# Patient Record
Sex: Female | Born: 1955
Health system: Southern US, Community
[De-identification: ages and names within clinical notes are randomized; demographics above are authoritative.]

## PROBLEM LIST (undated history)

## (undated) DIAGNOSIS — S82201A Unspecified fracture of shaft of right tibia, initial encounter for closed fracture: Secondary | ICD-10-CM

## (undated) DIAGNOSIS — R112 Nausea with vomiting, unspecified: Secondary | ICD-10-CM

## (undated) DIAGNOSIS — E785 Hyperlipidemia, unspecified: Secondary | ICD-10-CM

## (undated) DIAGNOSIS — E039 Hypothyroidism, unspecified: Secondary | ICD-10-CM

## (undated) DIAGNOSIS — I10 Essential (primary) hypertension: Secondary | ICD-10-CM

## (undated) DIAGNOSIS — T8859XA Other complications of anesthesia, initial encounter: Secondary | ICD-10-CM

## (undated) DIAGNOSIS — E079 Disorder of thyroid, unspecified: Secondary | ICD-10-CM

## (undated) DIAGNOSIS — Z9889 Other specified postprocedural states: Secondary | ICD-10-CM

## (undated) DIAGNOSIS — U071 COVID-19: Secondary | ICD-10-CM

## (undated) DIAGNOSIS — T4145XA Adverse effect of unspecified anesthetic, initial encounter: Secondary | ICD-10-CM

## (undated) DIAGNOSIS — T300 Burn of unspecified body region, unspecified degree: Secondary | ICD-10-CM

## (undated) DIAGNOSIS — K449 Diaphragmatic hernia without obstruction or gangrene: Secondary | ICD-10-CM

## (undated) DIAGNOSIS — N6019 Diffuse cystic mastopathy of unspecified breast: Secondary | ICD-10-CM

## (undated) DIAGNOSIS — D649 Anemia, unspecified: Secondary | ICD-10-CM

## (undated) HISTORY — DX: Diaphragmatic hernia without obstruction or gangrene: K44.9

## (undated) HISTORY — DX: Unspecified fracture of shaft of right tibia, initial encounter for closed fracture: S82.201A

## (undated) HISTORY — DX: Disorder of thyroid, unspecified: E07.9

## (undated) HISTORY — PX: ABDOMINAL HYSTERECTOMY: SHX81

## (undated) HISTORY — PX: BLADDER SURGERY: SHX569

## (undated) HISTORY — DX: Burn of unspecified body region, unspecified degree: T30.0

## (undated) HISTORY — PX: HEMORROIDECTOMY: SUR656

## (undated) HISTORY — PX: TOE SURGERY: SHX1073

## (undated) HISTORY — DX: Diffuse cystic mastopathy of unspecified breast: N60.19

## (undated) HISTORY — DX: COVID-19: U07.1

## (undated) HISTORY — DX: Hyperlipidemia, unspecified: E78.5

## (undated) HISTORY — PX: FRACTURE SURGERY: SHX138

## (undated) HISTORY — PX: BREAST CYST ASPIRATION: SHX578

## (undated) HISTORY — DX: Essential (primary) hypertension: I10

---

## 1997-08-04 HISTORY — PX: OTHER SURGICAL HISTORY: SHX169

## 2004-07-21 ENCOUNTER — Emergency Department: Payer: Self-pay | Admitting: Emergency Medicine

## 2004-08-02 ENCOUNTER — Ambulatory Visit: Payer: Self-pay | Admitting: Podiatry

## 2004-11-29 ENCOUNTER — Emergency Department: Payer: Self-pay | Admitting: Emergency Medicine

## 2004-12-08 ENCOUNTER — Emergency Department: Payer: Self-pay | Admitting: Emergency Medicine

## 2004-12-09 ENCOUNTER — Ambulatory Visit: Payer: Self-pay | Admitting: Emergency Medicine

## 2005-01-24 ENCOUNTER — Ambulatory Visit: Payer: Self-pay | Admitting: Unknown Physician Specialty

## 2005-07-26 ENCOUNTER — Emergency Department: Payer: Self-pay | Admitting: Emergency Medicine
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
## 2006-03-05 ENCOUNTER — Ambulatory Visit: Payer: Self-pay | Admitting: General Surgery

## 2007-03-02 ENCOUNTER — Ambulatory Visit: Payer: Self-pay | Admitting: General Surgery

## 2008-02-08 ENCOUNTER — Ambulatory Visit: Payer: Self-pay | Admitting: General Surgery

## 2008-05-20 ENCOUNTER — Emergency Department: Payer: Self-pay | Admitting: Emergency Medicine

## 2009-02-08 ENCOUNTER — Ambulatory Visit: Payer: Self-pay | Admitting: General Surgery

## 2009-08-04 DIAGNOSIS — E079 Disorder of thyroid, unspecified: Secondary | ICD-10-CM

## 2009-08-04 HISTORY — PX: COLONOSCOPY: SHX174

## 2009-08-04 HISTORY — DX: Disorder of thyroid, unspecified: E07.9

## 2009-10-14 ENCOUNTER — Ambulatory Visit: Payer: Self-pay | Admitting: Internal Medicine

## 2010-01-28 LAB — HM PAP SMEAR

## 2010-02-11 ENCOUNTER — Ambulatory Visit: Payer: Self-pay | Admitting: Unknown Physician Specialty

## 2010-03-13 LAB — HM COLONOSCOPY

## 2010-04-01 HISTORY — PX: BREAST SURGERY: SHX581

## 2011-02-13 ENCOUNTER — Ambulatory Visit: Payer: Self-pay | Admitting: General Surgery
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
## 2012-02-17 ENCOUNTER — Ambulatory Visit: Payer: Self-pay | Admitting: General Surgery

## 2013-02-17 ENCOUNTER — Ambulatory Visit: Payer: Self-pay | Admitting: General Surgery

## 2013-02-18 ENCOUNTER — Encounter: Payer: Self-pay | Admitting: General Surgery

## 2013-03-01 ENCOUNTER — Ambulatory Visit (INDEPENDENT_AMBULATORY_CARE_PROVIDER_SITE_OTHER): Payer: BC Managed Care – PPO | Admitting: General Surgery

## 2013-03-01 ENCOUNTER — Encounter: Payer: Self-pay | Admitting: General Surgery

## 2013-03-01 VITALS — BP 130/70 | HR 74 | Resp 12 | Ht 63.0 in | Wt 175.0 lb

## 2013-03-01 DIAGNOSIS — Z87898 Personal history of other specified conditions: Secondary | ICD-10-CM | POA: Insufficient documentation

## 2013-03-01 DIAGNOSIS — N6019 Diffuse cystic mastopathy of unspecified breast: Secondary | ICD-10-CM

## 2013-03-01 HISTORY — DX: Personal history of other specified conditions: Z87.898

## 2013-03-01 NOTE — Progress Notes (Signed)
Patient ID: Lindsay Carney, female   DOB: 24-Jun-1956, 57 y.o.   MRN: [id number]  No chief complaint on file.   HPI Lindsay Carney is a 57 y.o. female who presents for a breast evaluation. The most recent mammogram was done at [facility] on 02/17/13 .  Patient does perform regular self breast checks and gets regular mammograms done. No breast symptoms to report. The patient does have dense breasts, and has a history of breast cyst aspirations.  HPI  Past Medical History  Diagnosis Date  . Diffuse cystic mastopathy   . Thyroid disease 2011  . Hyperlipidemia     Past Surgical History  Procedure Laterality Date  . Knee surgery Right   . Bladder surgery    . Toe surgery    . Colonoscopy  2011  . Breast surgery Right 04/01/10  . Abdominal hysterectomy      bladder attached  . Lipoma removal  1999    inner knee    Family History  Problem Relation Age of Onset  . Breast cancer Paternal Aunt     Social History History  Substance Use Topics  . Smoking status: Never Smoker   . Smokeless tobacco: Never Used  . Alcohol Use: No    No Known Allergies  Current Outpatient Prescriptions  Medication Sig Dispense Refill  . aspirin 81 MG tablet Take 81 mg by mouth daily.      Marland Kitchen atorvastatin (LIPITOR) 20 MG tablet Take 20 mg by mouth daily.      . cholecalciferol (VITAMIN D) 1000 UNITS tablet Take 1,000 Units by mouth daily.      Marland Kitchen docusate sodium (COLACE) 250 MG capsule Take 250 mg by mouth 2 (two) times daily.      Marland Kitchen levothyroxine (SYNTHROID, LEVOTHROID) 75 MCG tablet Take 75 mcg by mouth daily before breakfast.      . Multiple Vitamins-Minerals (ONE-A-DAY 50 PLUS) TABS Take 1 tablet by mouth daily.      . Omega-3 Fatty Acids (FISH OIL) 1200 MG CAPS Take 1 capsule by mouth daily.       No current facility-administered medications for this visit.    Review of Systems Review of Systems  Constitutional: Negative.   Respiratory: Negative.   Cardiovascular: Negative.     Blood  pressure 130/70, pulse 74, resp. rate 12, height 5\' 3"  (1.6 m), weight 175 lb (79.379 kg).  Physical Exam Physical Exam  Constitutional: She is oriented to person, place, and time. She appears well-developed and well-nourished.  Neck: Neck supple.  Cardiovascular: Normal rate and regular rhythm.   Pulmonary/Chest: Effort normal and breath sounds normal. Right breast exhibits no inverted nipple, no mass, no nipple discharge, no skin change and no tenderness. Left breast exhibits no inverted nipple, no mass, no nipple discharge, no skin change and no tenderness.  Lymphadenopathy:    She has no cervical adenopathy.  Neurological: She is alert and oriented to person, place, and time.  Skin:       Data Reviewed Mammogram stable  Assessment    Stable exam     Plan    Return in 1 year with Screening mammogram        Hipolito Martinezlopez G 03/02/2013, 6:32 AM

## 2013-03-01 NOTE — Patient Instructions (Addendum)
Call if any changes.  

## 2013-03-02 ENCOUNTER — Encounter: Payer: Self-pay | Admitting: General Surgery

## 2014-03-02 ENCOUNTER — Encounter: Payer: Self-pay | Admitting: General Surgery

## 2014-03-09 ENCOUNTER — Ambulatory Visit (INDEPENDENT_AMBULATORY_CARE_PROVIDER_SITE_OTHER): Payer: BC Managed Care – PPO | Admitting: General Surgery

## 2014-03-09 ENCOUNTER — Encounter: Payer: Self-pay | Admitting: General Surgery

## 2014-03-09 VITALS — BP 128/64 | HR 72 | Resp 12 | Ht 63.0 in | Wt 178.0 lb

## 2014-03-09 DIAGNOSIS — N6019 Diffuse cystic mastopathy of unspecified breast: Secondary | ICD-10-CM

## 2014-03-09 DIAGNOSIS — Z803 Family history of malignant neoplasm of breast: Secondary | ICD-10-CM

## 2014-03-09 NOTE — Patient Instructions (Signed)
Follow up in one year with bilateral screening mammogram and office visit. Continue self breast exams. Call office for any new breast issues or concerns.

## 2014-03-09 NOTE — Progress Notes (Signed)
Patient ID: Lindsay Carney, female   DOB: 1955-12-05, 58 y.o.   MRN: [id number]  Chief Complaint  Patient presents with  . Follow-up    mammogram    HPI Lindsay Carney is a 58 y.o. female.  who presents for her follow up breast evaluation. The most recent mammogram was done on 02-27-14.  Patient does perform regular self breast checks and gets regular mammograms done. No new breast issues.   HPI  Past Medical History  Diagnosis Date  . Diffuse cystic mastopathy   . Thyroid disease 2011  . Hyperlipidemia     Past Surgical History  Procedure Laterality Date  . Knee surgery Right   . Bladder surgery    . Toe surgery    . Colonoscopy  2011  . Breast surgery Right 04/01/10  . Abdominal hysterectomy      bladder attached  . Lipoma removal  1999    inner knee    Family History  Problem Relation Age of Onset  . Breast cancer Paternal Aunt     Social History History  Substance Use Topics  . Smoking status: Never Smoker   . Smokeless tobacco: Never Used  . Alcohol Use: No    No Known Allergies  Current Outpatient Prescriptions  Medication Sig Dispense Refill  . aspirin 81 MG tablet Take 81 mg by mouth daily.      Marland Kitchen atorvastatin (LIPITOR) 20 MG tablet Take 20 mg by mouth daily.      . cholecalciferol (VITAMIN D) 1000 UNITS tablet Take 1,000 Units by mouth daily.      Marland Kitchen docusate sodium (COLACE) 250 MG capsule Take 250 mg by mouth 2 (two) times daily.      Marland Kitchen levothyroxine (SYNTHROID, LEVOTHROID) 75 MCG tablet Take 75 mcg by mouth daily before breakfast.      . Multiple Vitamins-Minerals (ONE-A-DAY 50 PLUS) TABS Take 1 tablet by mouth daily.      . Omega-3 Fatty Acids (FISH OIL) 1200 MG CAPS Take 1 capsule by mouth daily.       No current facility-administered medications for this visit.    Review of Systems Review of Systems  Constitutional: Negative.   Respiratory: Negative.   Cardiovascular: Negative.     Blood pressure 128/64, pulse 72, resp. rate 12, height 5'  3" (1.6 m), weight 178 lb (80.74 kg).  Physical Exam Physical Exam  Constitutional: She is oriented to person, place, and time. She appears well-developed and well-nourished.  Eyes: Conjunctivae are normal. No scleral icterus.  Neck: Neck supple.  Cardiovascular: Normal rate, regular rhythm and normal heart sounds.   Pulmonary/Chest: Effort normal and breath sounds normal. Right breast exhibits no inverted nipple, no mass, no nipple discharge, no skin change and no tenderness. Left breast exhibits no inverted nipple, no mass, no nipple discharge, no skin change and no tenderness.  Lymphadenopathy:    She has no cervical adenopathy.    She has no axillary adenopathy.  Neurological: She is alert and oriented to person, place, and time.  Skin: Skin is warm and dry.     Lipomas are unchanged    Data Reviewed Mammogram reviewed and stable.  Assessment    Stable physical exam.    Plan    Follow up in one year with bilateral screening mammogram and office visit.       SANKAR,SEEPLAPUTHUR G 03/10/2014, 9:27 AM

## 2014-03-10 ENCOUNTER — Encounter: Payer: Self-pay | Admitting: General Surgery

## 2014-06-05 ENCOUNTER — Encounter: Payer: Self-pay | Admitting: General Surgery

## 2014-07-14 ENCOUNTER — Ambulatory Visit: Payer: Self-pay | Admitting: Family Medicine

## 2014-07-24 ENCOUNTER — Ambulatory Visit (INDEPENDENT_AMBULATORY_CARE_PROVIDER_SITE_OTHER): Payer: BC Managed Care – PPO | Admitting: Family Medicine

## 2014-07-24 ENCOUNTER — Encounter: Payer: Self-pay | Admitting: Family Medicine

## 2014-07-24 VITALS — BP 124/70 | HR 73 | Temp 98.0°F | Ht 62.75 in | Wt 186.5 lb

## 2014-07-24 DIAGNOSIS — Z8639 Personal history of other endocrine, nutritional and metabolic disease: Secondary | ICD-10-CM | POA: Insufficient documentation

## 2014-07-24 DIAGNOSIS — H6980 Other specified disorders of Eustachian tube, unspecified ear: Secondary | ICD-10-CM

## 2014-07-24 DIAGNOSIS — J329 Chronic sinusitis, unspecified: Secondary | ICD-10-CM

## 2014-07-24 DIAGNOSIS — E038 Other specified hypothyroidism: Secondary | ICD-10-CM

## 2014-07-24 DIAGNOSIS — R232 Flushing: Secondary | ICD-10-CM | POA: Insufficient documentation

## 2014-07-24 DIAGNOSIS — R0683 Snoring: Secondary | ICD-10-CM

## 2014-07-24 DIAGNOSIS — E039 Hypothyroidism, unspecified: Secondary | ICD-10-CM | POA: Insufficient documentation

## 2014-07-24 DIAGNOSIS — E669 Obesity, unspecified: Secondary | ICD-10-CM | POA: Insufficient documentation

## 2014-07-24 DIAGNOSIS — K5900 Constipation, unspecified: Secondary | ICD-10-CM

## 2014-07-24 DIAGNOSIS — N951 Menopausal and female climacteric states: Secondary | ICD-10-CM

## 2014-07-24 DIAGNOSIS — E785 Hyperlipidemia, unspecified: Secondary | ICD-10-CM

## 2014-07-24 HISTORY — DX: Personal history of other endocrine, nutritional and metabolic disease: Z86.39

## 2014-07-24 HISTORY — DX: Flushing: R23.2

## 2014-07-24 HISTORY — DX: Obesity, unspecified: E66.9

## 2014-07-24 HISTORY — DX: Constipation, unspecified: K59.00

## 2014-07-24 LAB — COMPREHENSIVE METABOLIC PANEL
ALK PHOS: 85 U/L (ref 39–117)
ALT: 26 U/L (ref 0–35)
AST: 21 U/L (ref 0–37)
Albumin: 4.3 g/dL (ref 3.5–5.2)
BILIRUBIN TOTAL: 0.8 mg/dL (ref 0.2–1.2)
BUN: 12 mg/dL (ref 6–23)
CO2: 28 mEq/L (ref 19–32)
Calcium: 9.3 mg/dL (ref 8.4–10.5)
Chloride: 103 mEq/L (ref 96–112)
Creatinine, Ser: 0.6 mg/dL (ref 0.4–1.2)
GFR: 109.75 mL/min (ref >60.00)
Glucose, Bld: 108 mg/dL — ABNORMAL HIGH (ref 70–99)
POTASSIUM: 4.1 meq/L (ref 3.5–5.1)
SODIUM: 139 meq/L (ref 135–145)
TOTAL PROTEIN: 7.1 g/dL (ref 6.0–8.3)

## 2014-07-24 LAB — CBC WITH DIFFERENTIAL/PLATELET
Basophils Absolute: 0 10*3/uL (ref 0.0–0.1)
Basophils Relative: 0.6 % (ref 0.0–3.0)
EOS ABS: 0.1 10*3/uL (ref 0.0–0.7)
Eosinophils Relative: 1.2 % (ref 0.0–5.0)
HCT: 41.8 % (ref 36.0–46.0)
Hemoglobin: 13.8 g/dL (ref 12.0–15.0)
Lymphocytes Relative: 28.2 % (ref 12.0–46.0)
Lymphs Abs: 1.5 10*3/uL (ref 0.7–4.0)
MCHC: 33 g/dL (ref 30.0–36.0)
MCV: 85 fl (ref 78.0–100.0)
MONO ABS: 0.3 10*3/uL (ref 0.1–1.0)
Monocytes Relative: 6.6 % (ref 3.0–12.0)
NEUTROS PCT: 63.4 % (ref 43.0–77.0)
Neutro Abs: 3.3 10*3/uL (ref 1.4–7.7)
PLATELETS: 271 10*3/uL (ref 150.0–400.0)
RBC: 4.92 Mil/uL (ref 3.87–5.11)
RDW: 14.2 % (ref 11.5–15.5)
WBC: 5.2 10*3/uL (ref 4.0–10.5)

## 2014-07-24 LAB — LIPID PANEL
CHOLESTEROL: 189 mg/dL (ref 0–200)
HDL: 41.6 mg/dL (ref >39.00)
LDL CALC: 122 mg/dL — AB (ref 0–99)
NonHDL: 147.4
TRIGLYCERIDES: 125 mg/dL (ref 0.0–149.0)
Total CHOL/HDL Ratio: 5
VLDL: 25 mg/dL (ref 0.0–40.0)

## 2014-07-24 NOTE — Assessment & Plan Note (Signed)
  Discussed strategies for weight loss including increased physical activity and tracking her calories.Also referred to nutritionist to help her with meal planning. The patient indicates understanding of these issues and agrees with the plan.\

## 2014-07-24 NOTE — Progress Notes (Signed)
Patient ID: Lindsay Carney, female   DOB: July 11, 1956, 58 y.o.   MRN: [id number]   Subjective:   Patient ID: Lindsay Carney, female    DOB: [birth year], 58 y.o.   MRN: [id number]  DORETHEA Carney is a pleasant 58 y.o. year old female who presents to clinic today with [place] [facility] and Nasal Congestion  on 07/24/2014  HPI: Hypothyroidism- has been taking synthroid 75 mg daily at this dose for at least a year. Denies any symptoms of hypo thyroidism other than difficulty losing weight.  Denies any hot or cold intolerance.  No changes in her skin or hair. No difficulty swallowing or hoarseness.  HLD- taking liptor 10 mg daily- needs trade name rx.  Per pt, generic "is not effective." Denies myalgias.  Obesity- "I am larger than I have ever been!"  Wants to work on losing weight.  Feels she eats healthy.  She does not exercise much.  ?sinus problems- past several months, on and off, feeling like her sinuses are clogged.  Even when she sneezes, she feels that she is not getting relief.  Ears pop all the time.  Using saline nasal sprays.  No nasal steroids or oral antihistamines.  Eyes are often watery and itchy.  Does not have a h/o allergies. No new pets- has had the same cat for 12 years.  Unaware of any other changes in her environment at work or at home.  Her husband also says that she has started snoring recently.  ? Menopausal- had a hysterectomy years ago due to endometriosis and pelvic organ prolapse.  Does still have her ovaries.  More hot flashes lately.  No issues with insomnia or vaginal dryness.  Last year, labs showed she was "almost menopausal."  Wants labs repeated today.  "pitutiary diease"- years ago was taking rx for her pituitary- has not had it for years.  She does not have HA or blurred vision and did not have those symptoms at that time.  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  Current Outpatient Prescriptions on File Prior to Visit  Medication Sig Dispense Refill  .  aspirin 81 MG tablet Take 81 mg by mouth daily.    Marland Kitchen atorvastatin (LIPITOR) 20 MG tablet Take 20 mg by mouth daily.    . cholecalciferol (VITAMIN D) 1000 UNITS tablet Take 1,000 Units by mouth daily.    Marland Kitchen docusate sodium (COLACE) 250 MG capsule Take 250 mg by mouth 2 (two) times daily.    Marland Kitchen levothyroxine (SYNTHROID, LEVOTHROID) 75 MCG tablet Take 75 mcg by mouth daily before breakfast.    . Multiple Vitamins-Minerals (ONE-A-DAY 50 PLUS) TABS Take 1 tablet by mouth daily.    . Omega-3 Fatty Acids (FISH OIL) 1200 MG CAPS Take 1 capsule by mouth daily.     No current facility-administered medications on file prior to visit.    No Known Allergies  Past Medical History  Diagnosis Date  . Diffuse cystic mastopathy   . Thyroid disease 2011  . Hyperlipidemia     Past Surgical History  Procedure Laterality Date  . Knee surgery Right   . Bladder surgery    . Toe surgery    . Colonoscopy  2011  . Breast surgery Right 04/01/10  . Abdominal hysterectomy      bladder attached  . Lipoma removal  1999    inner knee  . Hemorroidectomy      Family History  Problem Relation Age of Onset  . Breast cancer Paternal  Aunt   . Cancer Paternal Aunt   . Arthritis Mother   . Hypertension Mother   . Cancer Father   . Hyperlipidemia Father   . Diabetes Father     History   Social History  . Marital Status: Married    Spouse Name: N/A    Number of Children: N/A  . Years of Education: N/A   Occupational History  . Not on file.   Social History Main Topics  . Smoking status: Never Smoker   . Smokeless tobacco: Never Used  . Alcohol Use: No  . Drug Use: No  . Sexual Activity: Not on file   Other Topics Concern  . Not on file   Social History Narrative   The PMH, PSH, Social History, Family History, Medications, and allergies have been reviewed in [facility], and have been updated if relevant.  Review of Systems  Constitutional: Negative for chills, activity change, appetite change,  fatigue and unexpected weight change.  HENT: Positive for congestion, postnasal drip, sinus pressure and sneezing. Negative for ear discharge, ear pain, facial swelling, hearing loss, mouth sores, nosebleeds, rhinorrhea, sore throat, tinnitus, trouble swallowing and voice change.   Eyes: Positive for redness and itching. Negative for photophobia, pain, discharge and visual disturbance.  Respiratory: Negative.   Cardiovascular: Negative.   Gastrointestinal: Negative.   Endocrine: Negative.   Genitourinary: Negative.   Musculoskeletal: Negative.   Skin: Negative.   Allergic/Immunologic: Negative for food allergies and immunocompromised state.  Neurological: Negative.   Hematological: Negative.   Psychiatric/Behavioral: Negative.   All other systems reviewed and are negative.      Objective:    BP 124/70 mmHg  Pulse 73  Temp(Src) 98 F (36.7 C) (Oral)  Ht 5' 2.75" (1.594 m)  Wt 186 lb 8 oz (84.596 kg)  BMI 33.29 kg/m2  SpO2 97%   Physical Exam  Constitutional: She is oriented to person, place, and time. She appears well-developed and well-nourished. No distress.  HENT:  Head: Normocephalic and atraumatic.  Right Ear: No lacerations. No drainage, swelling or tenderness. No foreign bodies. No mastoid tenderness. Tympanic membrane is not injected, not scarred, not perforated, not erythematous, not retracted and not bulging. Tympanic membrane mobility is normal. A middle ear effusion is present. No hemotympanum. No decreased hearing is noted.  Left Ear: No lacerations. No drainage, swelling or tenderness. No foreign bodies. No mastoid tenderness. Tympanic membrane is not injected, not scarred, not perforated, not erythematous, not retracted and not bulging. Tympanic membrane mobility is normal. A middle ear effusion is present. No hemotympanum. No decreased hearing is noted.  Nose: Mucosal edema present. No rhinorrhea or sinus tenderness. Right sinus exhibits no maxillary sinus  tenderness and no frontal sinus tenderness. Left sinus exhibits no maxillary sinus tenderness and no frontal sinus tenderness.  Mouth/Throat: Uvula is midline and mucous membranes are normal. Posterior oropharyngeal edema present. No oropharyngeal exudate, posterior oropharyngeal erythema or tonsillar abscesses.  Eyes: EOM are normal. Right conjunctiva is not injected. Left conjunctiva is injected.  Cardiovascular: Normal rate and normal heart sounds.   Pulmonary/Chest: Effort normal and breath sounds normal.  Abdominal: Soft. Bowel sounds are normal.  Musculoskeletal: Normal range of motion.  Neurological: She is alert and oriented to person, place, and time.  Skin: Skin is warm and dry.  Psychiatric: She has a normal mood and affect. Her behavior is normal. Judgment and thought content normal.          Assessment & Plan:   Other specified hypothyroidism -  Plan: CBC with Differential, TSH, T4, Free  HLD (hyperlipidemia) - Plan: Comprehensive metabolic panel, Lipid panel  Constipation, unspecified constipation type  Recurrent sinusitis No Follow-up on file.

## 2014-07-24 NOTE — Assessment & Plan Note (Signed)
Awaiting records- unclear what her diagnosis truly was. Will check prolactin level. No red flag symptoms warranting brain imaging at this time.

## 2014-07-24 NOTE — Assessment & Plan Note (Signed)
Continue current rx for now. Check labs today.

## 2014-07-24 NOTE — Assessment & Plan Note (Signed)
Continue current dose of lipitor. Recheck labs today.

## 2014-07-24 NOTE — Patient Instructions (Addendum)
Nice to meet you.  I would like for you to start Allegra or Claritin daily.   Also try an antihistamine/decongestant like Allegra or Allegra D.  Try over the counter nasocort-start with 2 sprays per nostril per day...and then try to taper to 1 spray per nostril once symptoms improve.   We are referring to an ENT and a nutritionist- we will call you with an appointment or you can stop by to see Rosaria Ferries on your way out.  We will call you with your lab results or you can view them online.  Try to increase your physical activity.

## 2014-07-24 NOTE — Assessment & Plan Note (Signed)
New- likely more than one issues- increased BMI along with ETD that is not being managed. With weight loss and ETD management, this should improve.  If not, refer to pulm for sleep study. The patient indicates understanding of these issues and agrees with the plan.

## 2014-07-24 NOTE — Assessment & Plan Note (Signed)
Check LH and FSH today per pt request.

## 2014-07-24 NOTE — Progress Notes (Signed)
Pre visit review using our clinic review tool, if applicable. No additional management support is needed unless otherwise documented below in the visit note. 

## 2014-07-24 NOTE — Assessment & Plan Note (Signed)
New- progressive. Start inhaled steroid- nasocort and advised oral antihistamine like Allegra or Claritin daily. Given her level of concern and duration/progression of symptoms, will also refer to ENT. The patient indicates understanding of these issues and agrees with the plan.

## 2014-07-25 ENCOUNTER — Telehealth: Payer: Self-pay | Admitting: Family Medicine

## 2014-07-25 LAB — PROLACTIN: PROLACTIN: 4.3 ng/mL

## 2014-07-25 LAB — T4, FREE: Free T4: 0.8 ng/dL (ref 0.60–1.60)

## 2014-07-25 LAB — TSH: TSH: 1.68 u[IU]/mL (ref 0.35–4.50)

## 2014-07-25 LAB — LUTEINIZING HORMONE: LH: 21.84 m[IU]/mL

## 2014-07-25 LAB — FOLLICLE STIMULATING HORMONE: FSH: 40 m[IU]/mL

## 2014-07-25 NOTE — Telephone Encounter (Signed)
Pt called [facility]

## 2014-07-26 ENCOUNTER — Encounter: Payer: Self-pay | Admitting: *Deleted

## 2014-07-31 ENCOUNTER — Ambulatory Visit: Payer: Self-pay | Admitting: Family Medicine

## 2014-08-01 ENCOUNTER — Telehealth: Payer: Self-pay | Admitting: Family Medicine

## 2014-08-01 NOTE — Telephone Encounter (Signed)
Pt called and is concerned b/c there are dates missing on MyChart for her last pap, tetanus, and colonoscopy.  They were done by other providers and she would like the dates added to her chart.  Her last pap was 01/28/2010 by Dr. Elisabeth Pigeon, but should have been transferred to [facility]; her last tetanus was a Tdap on 03/02/2014 by [facility].; colonoscopy was 03/13/2010 by Dr. Alain [facility], everything was clear, no polyps or problems, told to come back in 10 years.  If you have any questions please call pt. Thank you.

## 2014-08-01 NOTE — Telephone Encounter (Signed)
Lindsay Carney,  Can you please abstract this in her chart? RB

## 2014-08-03 ENCOUNTER — Ambulatory Visit: Payer: Self-pay | Admitting: Family Medicine

## 2014-08-04 ENCOUNTER — Ambulatory Visit: Payer: Self-pay | Admitting: Family Medicine

## 2014-08-10 ENCOUNTER — Encounter: Payer: Self-pay | Admitting: Family Medicine

## 2014-08-10 NOTE — Telephone Encounter (Signed)
Pt chart up dated.

## 2014-08-15 ENCOUNTER — Other Ambulatory Visit: Payer: Self-pay

## 2014-08-15 MED ORDER — LEVOTHYROXINE SODIUM 75 MCG PO TABS: 75.0000 ug | ORAL_TABLET | Freq: Every day | ORAL | Status: DC

## 2014-08-15 NOTE — Telephone Encounter (Signed)
Pt request 90 day refill levothyroxine to [facility]. Advised pt done.

## 2014-09-04 ENCOUNTER — Ambulatory Visit: Payer: Self-pay | Admitting: Family Medicine

## 2014-10-03 ENCOUNTER — Ambulatory Visit
Admit: 2014-10-03 | Disposition: A | Discharge status: Home / Self Care | Payer: Self-pay | Attending: Family Medicine | Admitting: Family Medicine

## 2014-10-06 ENCOUNTER — Telehealth: Payer: Self-pay

## 2014-10-06 DIAGNOSIS — E785 Hyperlipidemia, unspecified: Secondary | ICD-10-CM

## 2014-10-06 NOTE — Telephone Encounter (Signed)
Pt left v/m;has changed ins to [facility] and ins wants pt to use generic meds; pt said she has tried taking atorvastatin and atorvastatin was not effective to lower cholesterol. Pt request [facility] to contact ins and get Lipitor approved.

## 2014-10-06 NOTE — Telephone Encounter (Signed)
Spoke to pt who states that she recently picked up Rx. Advised pt that when she is down to 1wk left, contact office back so Rx be sent in as DAW. Pharmacy should send as requested, but if not request pt will have form faxed for completion and approval. Insurance company advised pt to send documentation showing she has failed on generic and required name brand. Pt states that she had requested records be sent from previous provider, but they have not been scanned in pt chart. Advised her they may have come, been signed off on by Dr Deborra Medina, and are awaiting scanning. Advised pt that even if send in Rx now, a determination will not be decided on DAW Rx, because they have already provided med coverage for this month. Pt will contact office back one week before completion of medication to have new Rx sent; provides enough time in the event that a PA is required so pt will not run out of meds.

## 2014-11-03 ENCOUNTER — Ambulatory Visit
Admit: 2014-11-03 | Disposition: A | Discharge status: Home / Self Care | Payer: Self-pay | Attending: Family Medicine | Admitting: Family Medicine

## 2014-11-06 NOTE — Telephone Encounter (Signed)
Patient left a voicemail stating that she now needs a refill on her Lipitor and was told to call back about a week before she needed the refill so that the medication could be approved since she needs name brand. See previous note dated 10/06/14.

## 2014-11-07 MED ORDER — ATORVASTATIN CALCIUM 20 MG PO TABS: 20.0000 mg | ORAL_TABLET | Freq: Every day | ORAL | Status: DC

## 2014-11-07 NOTE — Addendum Note (Signed)
Addended by: Kyra Manges on: 11/07/2014 01:26 PM   Modules accepted: Orders

## 2014-11-07 NOTE — Telephone Encounter (Signed)
Last OV 07/24/14, Medication last ordered 03/01/2013

## 2014-11-07 NOTE — Addendum Note (Signed)
Addended by: Kyra Manges on: 11/07/2014 10:01 AM   Modules accepted: Orders

## 2014-11-07 NOTE — Telephone Encounter (Signed)
Ok to refill as requested 

## 2014-11-13 NOTE — Telephone Encounter (Signed)
Pt left v/m; pt has changed ins; lipitor refill was sent in last week and pt was told that ins was requiring more paperwork and pt request cb with status of name brand Lipitor; pt has taken generic before and it did not work; pt has been out of med since 11/09/14. Pt request cb.

## 2014-11-14 MED ORDER — ATORVASTATIN CALCIUM 20 MG PO TABS: 20.0000 mg | ORAL_TABLET | Freq: Every day | ORAL | Status: DC

## 2014-11-14 NOTE — Addendum Note (Signed)
Addended by: Modena Nunnery on: 11/14/2014 08:06 AM   Modules accepted: Orders

## 2014-11-14 NOTE — Telephone Encounter (Signed)
Lm on pts vm requesting a call back. Rx re-sent DAW

## 2014-11-17 ENCOUNTER — Telehealth: Payer: Self-pay | Admitting: Family Medicine

## 2014-11-17 DIAGNOSIS — E785 Hyperlipidemia, unspecified: Secondary | ICD-10-CM

## 2014-11-17 NOTE — Telephone Encounter (Signed)
Pt called checking on rx for liptor.  Insurance wants proof that she has tried other chol meds.  Pt also wanted to know if she needs to come and get lab work done for her chol.  The last day she has taken this med was 11/09/14  Please advise

## 2014-11-17 NOTE — Telephone Encounter (Signed)
Great work in losing [employer] and changing her lifestyle.  Orders entered for lipid panel and CMET.

## 2014-11-17 NOTE — Telephone Encounter (Signed)
Spoke to pt and informed her that fax received on today indicating Lipitor rejected due to pt never trying an alternate cholesterol medication. Advised pt to contact insurance company and determine which medication is covered and contact office back to have new Rx sent.  Pt states that she is wanting to have repeat labs as she has lost 12lbs, and thinks it may have helped to lower cholesterol, and she may not need to continue lipid meds. If pt is able to have repeat labs, pls enter order and I will contact pt to advise and schedule appt

## 2014-11-17 NOTE — Telephone Encounter (Signed)
Lm on pts vm and informed per Dr Deborra Medina. Pt advised to contact office and schedule lab appt

## 2014-12-01 ENCOUNTER — Other Ambulatory Visit (INDEPENDENT_AMBULATORY_CARE_PROVIDER_SITE_OTHER): Payer: 59

## 2014-12-01 DIAGNOSIS — E785 Hyperlipidemia, unspecified: Secondary | ICD-10-CM | POA: Diagnosis not present

## 2014-12-01 LAB — COMPREHENSIVE METABOLIC PANEL
ALBUMIN: 4.2 g/dL (ref 3.5–5.2)
ALT: 14 U/L (ref 0–35)
AST: 15 U/L (ref 0–37)
Alkaline Phosphatase: 71 U/L (ref 39–117)
BILIRUBIN TOTAL: 0.6 mg/dL (ref 0.2–1.2)
BUN: 11 mg/dL (ref 6–23)
CHLORIDE: 103 meq/L (ref 96–112)
CO2: 29 mEq/L (ref 19–32)
Calcium: 9.4 mg/dL (ref 8.4–10.5)
Creatinine, Ser: 0.56 mg/dL (ref 0.40–1.20)
GFR: 118.7 mL/min (ref >60.00)
GLUCOSE: 91 mg/dL (ref 70–99)
Potassium: 3.9 mEq/L (ref 3.5–5.1)
SODIUM: 138 meq/L (ref 135–145)
TOTAL PROTEIN: 6.8 g/dL (ref 6.0–8.3)

## 2014-12-01 LAB — LIPID PANEL
Cholesterol: 249 mg/dL — ABNORMAL HIGH (ref 0–200)
HDL: 42.5 mg/dL (ref >39.00)
LDL Cholesterol: 175 mg/dL — ABNORMAL HIGH (ref 0–99)
NonHDL: 206.5
Total CHOL/HDL Ratio: 6
Triglycerides: 159 mg/dL — ABNORMAL HIGH (ref 0.0–149.0)
VLDL: 31.8 mg/dL (ref 0.0–40.0)

## 2014-12-04 ENCOUNTER — Other Ambulatory Visit: Payer: Self-pay | Admitting: Family Medicine

## 2014-12-04 DIAGNOSIS — E785 Hyperlipidemia, unspecified: Secondary | ICD-10-CM

## 2014-12-06 ENCOUNTER — Telehealth: Payer: Self-pay

## 2014-12-06 NOTE — Telephone Encounter (Signed)
[employer] left v/m requesting cb at [phone] for prior auth for Lipitor.

## 2014-12-06 NOTE — Telephone Encounter (Signed)
Pt left v/m pt requesting prior auth because name brand lipitor is tier 3; pt took generic lipitor for one year and it was not effective for pt and pt wants name brand lipitor. Pt request cb.

## 2014-12-07 NOTE — Telephone Encounter (Addendum)
Pt left v/m; pt had contacted [facility] and [employer] will not accept fax # but does need a call to [phone]. Pt said [employer] needs cb that pt needs namebrand Lipitor only.

## 2014-12-07 NOTE — Telephone Encounter (Addendum)
Form received, completed and faxed back to requested party. Awaiting response. No info known regarding previous message below.

## 2014-12-07 NOTE — Telephone Encounter (Signed)
contacted [employer] but was unable wait 48min due to being in clinic. Spoke to pt and informed her that PA was previously completed and denied. Pt states she will contact insurance and have PA form faxed for completion

## 2014-12-15 ENCOUNTER — Encounter: Payer: 59 | Attending: Family Medicine | Admitting: Dietician

## 2014-12-15 ENCOUNTER — Encounter: Payer: Self-pay | Admitting: Dietician

## 2014-12-15 VITALS — Ht 62.75 in | Wt 171.8 lb

## 2014-12-15 DIAGNOSIS — R7309 Other abnormal glucose: Secondary | ICD-10-CM | POA: Diagnosis present

## 2014-12-15 DIAGNOSIS — E669 Obesity, unspecified: Secondary | ICD-10-CM | POA: Diagnosis not present

## 2014-12-15 DIAGNOSIS — E785 Hyperlipidemia, unspecified: Secondary | ICD-10-CM | POA: Diagnosis present

## 2014-12-15 DIAGNOSIS — R7303 Prediabetes: Secondary | ICD-10-CM

## 2014-12-15 NOTE — Progress Notes (Signed)
Medical Nutrition Therapy: Visit time: 8:15-8:45am  This is 4th follow-up appointment. Initial appt. Was 08/04/15 Assessment:  Diagnosis: obesity, hyperlipidemia, pre-diabetes  Current weight: 171.8  Height: 62.75 in Medications, supplements: see list Progress and evaluation: Pt. Has lost 4.3 lbs since previous visit, 6 weeks ago. She met goal of limiting dessert at part-time job dinner to 1/2 serving and eliminated yeast rolls. She has not been able to add 10 minutes to exercise time. She continues with previous positive diet changes.She eats 3 meals per day, spaced 4-5 hours apart and includes a small snack if time between meals is longer. She has increased her water intake and eliminated all sugar-sweetened beverages.She limits her carbohydrate servings to 2-3 per meal. Her most recent labwork indicates a FBG of 91, down from 108 in 12/15. Her total and LDL cholesterol increased and she explained that she has not taken her statin medication since mid-April due to insurance coverage issues. She is working presently to have resolved and knows the importance of taking her medication.   Physical activity: 30 minutes on glider, daily   Nutrition Care Education: Basic Nutrition: encouraged calcium sources and grains to increase fiber. Weight control: benefits of weight control, identifying healthy weight, determining reasonable weight goal, behavioral changes for weight loss Pre- Diabetes:  goals for BGs, appropriate meal and snack schedule, Carb counting, appropriate carb intake and balance, Hyperlipidemia:  target goals for lipids, healthy and unhealthy fats, role of fiber food sources of folate, Vitamin B12, B6, Vitamin E, phytochemicals Other lifestyle changes:  benefits of making changes, increasing motivation, readiness for change, identifying habits that need to change,   Nutritional Diagnosis:  Low calcium intake, low fiber intake   Intervention:  Pt. To continue with previous goals. To  increase intensity of exercise. Set goal to have BMI less than 30 at next visit.   Education Materials given:  Marland Kitchen Goals/ instructions  Learner/ who was taught:  . Patient   Level of understanding: . Unable to understand/ needs instruction . Partial understanding; needs review/ practice . Verbalizes/ demonstrates competency . Not applicable Demonstrated degree of understanding via:   Teach back Learning barriers: . None   Willingness to learn/ readiness for change: . Eager, change in progress .   Monitoring and Evaluation:  Follow-up- 01/26/15 at 8:00am.

## 2014-12-15 NOTE — Patient Instructions (Signed)
Pt. To continue with previous goals. To increase intensity of exercise. Set goal to have BMI less than 30 at next visit.

## 2014-12-19 NOTE — Telephone Encounter (Addendum)
[employer] called wanting to verify dx code for lipitor, I gave them hyperlipidemia E78.5.   Just FYI

## 2014-12-19 NOTE — Telephone Encounter (Signed)
Fax received indicating pts medication covered through 12/19/2015. Copy faxed to pharmacy and sent for scanning

## 2014-12-19 NOTE — Telephone Encounter (Signed)
[employer] calling stating they did not receive fax that's scanned into pt's chart. I advised it was faxed on 12/07/14. They would like to speak directly her nurse.

## 2015-01-26 ENCOUNTER — Encounter: Payer: Self-pay | Admitting: Dietician

## 2015-01-26 ENCOUNTER — Encounter: Payer: 59 | Attending: Family Medicine | Admitting: Dietician

## 2015-01-26 VITALS — Wt 168.5 lb

## 2015-01-26 DIAGNOSIS — R7309 Other abnormal glucose: Secondary | ICD-10-CM | POA: Diagnosis present

## 2015-01-26 DIAGNOSIS — E785 Hyperlipidemia, unspecified: Secondary | ICD-10-CM | POA: Diagnosis present

## 2015-01-26 DIAGNOSIS — E669 Obesity, unspecified: Secondary | ICD-10-CM | POA: Diagnosis not present

## 2015-01-26 NOTE — Progress Notes (Signed)
Medical Nutrition Therapy Follow-up visit:   Time:8:00-9:00 am Visit #:6 ASSESSMENT:  Diagnosis: obesity, hyperlipidemia, pre-diabetes Current weight:168.5 lbs    Height:62.75 in Medications: See list; Patient reports that she was able to get insurance coverage for statin medication and is now taking.  Progress and evaluation: Patient set goal to lower her BMI to 30 by this visit. She lost 3.3 lbs in past 6 weeks and BMI is 30.1. She has lost a total of 17.5 lbs since her initial visit 07/2014. She also accomplished goal of increasing intensity of exercise. Patient does not work during the summer months. She states she normally "diets", being restrictive and loses weight during the summer, only to regain in the fall. She states she plans to continue her present pattern of eating as well as balance of carbohydrate, protein and non-starchy vegetables rather than over-restricting her food intake. Her food recall yesterday was: B- 1c. ApartmentProfile.is, oatmeal, walnuts, 1/2 orange; L- cashews, mixed fruit, SF pudding and SF jello; Snack- grapes, cheese stick; D- small filet mignon, sweet baked potato, salad (no dressing), toast; doesn't eat after dinner.  Physical activity:glider 30 minutes/day  NUTRITION CARE EDUCATION: Weight Control: Commended patient on how consistent she is with meals and balance of carbohydrates and protein and her rate of weight loss.  Basic Nutrition: Discussed food group serving needs and how she is meeting recommendation for fruit/vegetable intake, dairy, protein and whole grains. Exercise: Discussed taking advantage of summer months to increase exercise and discussed again how increasing intensity helps to raise heart rate.  INTERVENTION:  Exercise 2 times/day for 30 minutes. Continue with 3 meals and 1 snack during summer months. May need to add a snack if exercising 2 times/day. Add 1 cup of milk to include with goal of 3 milk products/day.  EDUCATION MATERIALS GIVEN:   . Goals/ instructions  LEARNER/ who was taught:  . Patient   LEVEL OF UNDERSTANDING: . Verbalizes/ demonstrates competency LEARNING BARRIERS: . None  WILLINGNESS TO LEARN/READINESS FOR CHANGE: . Eager, change in progress MONITORING AND EVALUATION:  Diet, exercise, labs   Follow-up: 03/09/15 at 8:00am

## 2015-01-26 NOTE — Patient Instructions (Signed)
Exercise 2 times/day for 30 minutes. Continue with 3 meals and 1 snack. Add 1 cup of milk to include with goal of 3 milk products/day.

## 2015-02-14 ENCOUNTER — Other Ambulatory Visit: Payer: Self-pay | Admitting: Family Medicine

## 2015-02-14 DIAGNOSIS — E785 Hyperlipidemia, unspecified: Secondary | ICD-10-CM

## 2015-02-14 DIAGNOSIS — E038 Other specified hypothyroidism: Secondary | ICD-10-CM

## 2015-02-14 DIAGNOSIS — Z01419 Encounter for gynecological examination (general) (routine) without abnormal findings: Secondary | ICD-10-CM

## 2015-02-15 ENCOUNTER — Other Ambulatory Visit (INDEPENDENT_AMBULATORY_CARE_PROVIDER_SITE_OTHER): Payer: 59

## 2015-02-15 DIAGNOSIS — E038 Other specified hypothyroidism: Secondary | ICD-10-CM

## 2015-02-15 DIAGNOSIS — E785 Hyperlipidemia, unspecified: Secondary | ICD-10-CM

## 2015-02-15 DIAGNOSIS — Z01419 Encounter for gynecological examination (general) (routine) without abnormal findings: Secondary | ICD-10-CM

## 2015-02-15 DIAGNOSIS — Z0000 Encounter for general adult medical examination without abnormal findings: Secondary | ICD-10-CM

## 2015-02-15 LAB — CBC WITH DIFFERENTIAL/PLATELET
Basophils Absolute: 0 10*3/uL (ref 0.0–0.1)
Basophils Relative: 0.5 % (ref 0.0–3.0)
Eosinophils Absolute: 0.1 10*3/uL (ref 0.0–0.7)
Eosinophils Relative: 1.3 % (ref 0.0–5.0)
HEMATOCRIT: 42.3 % (ref 36.0–46.0)
HEMOGLOBIN: 14.2 g/dL (ref 12.0–15.0)
LYMPHS ABS: 1.5 10*3/uL (ref 0.7–4.0)
LYMPHS PCT: 30.6 % (ref 12.0–46.0)
MCHC: 33.6 g/dL (ref 30.0–36.0)
MCV: 86.7 fl (ref 78.0–100.0)
MONOS PCT: 6.6 % (ref 3.0–12.0)
Monocytes Absolute: 0.3 10*3/uL (ref 0.1–1.0)
Neutro Abs: 3 10*3/uL (ref 1.4–7.7)
Neutrophils Relative %: 61 % (ref 43.0–77.0)
Platelets: 255 10*3/uL (ref 150.0–400.0)
RBC: 4.88 Mil/uL (ref 3.87–5.11)
RDW: 13.6 % (ref 11.5–15.5)
WBC: 5 10*3/uL (ref 4.0–10.5)

## 2015-02-15 LAB — COMPREHENSIVE METABOLIC PANEL
ALBUMIN: 4.3 g/dL (ref 3.5–5.2)
ALK PHOS: 68 U/L (ref 39–117)
ALT: 13 U/L (ref 0–35)
AST: 14 U/L (ref 0–37)
BUN: 11 mg/dL (ref 6–23)
CO2: 31 mEq/L (ref 19–32)
Calcium: 9.6 mg/dL (ref 8.4–10.5)
Chloride: 104 mEq/L (ref 96–112)
Creatinine, Ser: 0.66 mg/dL (ref 0.40–1.20)
GFR: 98.12 mL/min (ref >60.00)
GLUCOSE: 94 mg/dL (ref 70–99)
Potassium: 4.5 mEq/L (ref 3.5–5.1)
Sodium: 140 mEq/L (ref 135–145)
Total Bilirubin: 0.6 mg/dL (ref 0.2–1.2)
Total Protein: 7.1 g/dL (ref 6.0–8.3)

## 2015-02-15 LAB — LIPID PANEL
CHOL/HDL RATIO: 3
CHOLESTEROL: 152 mg/dL (ref 0–200)
HDL: 46 mg/dL (ref >39.00)
LDL CALC: 89 mg/dL (ref 0–99)
NonHDL: 106
Triglycerides: 84 mg/dL (ref 0.0–149.0)
VLDL: 16.8 mg/dL (ref 0.0–40.0)

## 2015-02-15 LAB — TSH: TSH: 1.77 u[IU]/mL (ref 0.35–4.50)

## 2015-02-15 LAB — T4, FREE: Free T4: 1.02 ng/dL (ref 0.60–1.60)

## 2015-02-22 ENCOUNTER — Encounter: Payer: Self-pay | Admitting: Family Medicine

## 2015-02-22 ENCOUNTER — Encounter: Payer: Self-pay | Admitting: General Surgery

## 2015-02-22 ENCOUNTER — Ambulatory Visit (INDEPENDENT_AMBULATORY_CARE_PROVIDER_SITE_OTHER): Payer: 59 | Admitting: Family Medicine

## 2015-02-22 ENCOUNTER — Other Ambulatory Visit (HOSPITAL_COMMUNITY)
Admission: RE | Admit: 2015-02-22 | Discharge: 2015-02-22 | Disposition: A | Discharge status: Home / Self Care | Payer: 59 | Source: Ambulatory Visit | Attending: Family Medicine | Admitting: Family Medicine

## 2015-02-22 VITALS — BP 124/78 | HR 64 | Temp 98.0°F | Ht 62.5 in | Wt 167.2 lb

## 2015-02-22 DIAGNOSIS — Z1151 Encounter for screening for human papillomavirus (HPV): Secondary | ICD-10-CM | POA: Diagnosis present

## 2015-02-22 DIAGNOSIS — E669 Obesity, unspecified: Secondary | ICD-10-CM

## 2015-02-22 DIAGNOSIS — E038 Other specified hypothyroidism: Secondary | ICD-10-CM

## 2015-02-22 DIAGNOSIS — Z8639 Personal history of other endocrine, nutritional and metabolic disease: Secondary | ICD-10-CM

## 2015-02-22 DIAGNOSIS — E785 Hyperlipidemia, unspecified: Secondary | ICD-10-CM

## 2015-02-22 DIAGNOSIS — Z01419 Encounter for gynecological examination (general) (routine) without abnormal findings: Secondary | ICD-10-CM | POA: Insufficient documentation

## 2015-02-22 DIAGNOSIS — Z0000 Encounter for general adult medical examination without abnormal findings: Secondary | ICD-10-CM | POA: Diagnosis not present

## 2015-02-22 MED ORDER — ATORVASTATIN CALCIUM 10 MG PO TABS: 10.0000 mg | ORAL_TABLET | Freq: Every day | ORAL | Status: DC

## 2015-02-22 NOTE — Assessment & Plan Note (Signed)
Well controlled with new diet and weight loss. Decrease lipitor dose to 10 mg daily. Repeat labs in 8 weeks.  Orders entered.

## 2015-02-22 NOTE — Assessment & Plan Note (Signed)
Well controlled on current dose of synthroid. No changes made today. 

## 2015-02-22 NOTE — Assessment & Plan Note (Signed)
Doing well.  Continues to lose weight and working with nutritionist. 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

## 2015-02-22 NOTE — Progress Notes (Signed)
Patient ID: Lindsay Carney, female   DOB: [birth year], 57 y.o.   MRN: [id number]   Subjective:   Patient ID: Lindsay Carney, female    DOB: [birth year], 59 y.o.   MRN: [id number]  Lindsay Carney is a pleasant 59 y.o. year old female who presents to clinic today with Annual Exam  and follow up of chronic medical conditions on 02/22/2015.  I have not seen her since she established care with me in 07/2014.  HPI:   Mammogram 02/21/15 Colonoscopy 03/13/10- Dr. Alain Marion Tdap 03/02/14 Remote h/o hysterectomy but not sure if she has a cervix. Last pap smear was in 2011.  Hypothyroidism- has been taking synthroid 75 mg daily at this dose for at least a year. Denies any symptoms of hypo thyroidism other than difficulty losing weight.  Denies any hot or cold intolerance.  No changes in her skin or hair. No difficulty swallowing or hoarseness. Lab Results  Component Value Date   TSH 1.77 02/15/2015     HLD- taking liptor 10 mg daily- needs trade name rx.  Per pt, generic "is not effective." Denies myalgias.  Lab Results  Component Value Date   CHOL 152 02/15/2015   HDL 46.00 02/15/2015   LDLCALC 89 02/15/2015   TRIG 84.0 02/15/2015   CHOLHDL 3 02/15/2015   Lab Results  Component Value Date   ALT 13 02/15/2015   AST 14 02/15/2015   ALKPHOS 68 02/15/2015   BILITOT 0.6 02/15/2015   Lab Results  Component Value Date   WBC 5.0 02/15/2015   HGB 14.2 02/15/2015   HCT 42.3 02/15/2015   MCV 86.7 02/15/2015   PLT 255.0 02/15/2015   Lab Results  Component Value Date   NA 140 02/15/2015   K 4.5 02/15/2015   CL 104 02/15/2015   CO2 31 02/15/2015   Lab Results  Component Value Date   CREATININE 0.66 02/15/2015     Obesity- referred her to nutritionist.  Doing well.  She has already lost 20 pounds- approximately 3 pounds per month.  Feels great!  Was last seen by nutritionist on 01/25/25. Note reviewed.   Wt Readings from Last 3 Encounters:  02/22/15 167 lb 4 oz (75.864 kg)  01/26/15 168  lb 8 oz (76.431 kg)  12/15/14 171 lb 12.8 oz (77.928 kg)      Current Outpatient Prescriptions on File Prior to Visit  Medication Sig Dispense Refill  . aspirin 81 MG tablet Take 81 mg by mouth daily.    Marland Kitchen atorvastatin (LIPITOR) 20 MG tablet Take 1 tablet (20 mg total) by mouth daily. 30 tablet 2  . cholecalciferol (VITAMIN D) 1000 UNITS tablet Take 1,000 Units by mouth daily.    Marland Kitchen docusate sodium (COLACE) 250 MG capsule Take 250 mg by mouth daily.     Marland Kitchen levothyroxine (SYNTHROID, LEVOTHROID) 75 MCG tablet Take 1 tablet (75 mcg total) by mouth daily before breakfast. 90 tablet 3  . Multiple Vitamins-Minerals (ONE-A-DAY 50 PLUS) TABS Take 1 tablet by mouth daily.    . Omega-3 Fatty Acids (FISH OIL) 1200 MG CAPS Take 1 capsule by mouth daily.     No current facility-administered medications on file prior to visit.    No Known Allergies  Past Medical History  Diagnosis Date  . Diffuse cystic mastopathy   . Thyroid disease 2011  . Hyperlipidemia     Past Surgical History  Procedure Laterality Date  . Knee surgery Right   . Bladder surgery    .  Toe surgery    . Colonoscopy  2011  . Breast surgery Right 04/01/10  . Abdominal hysterectomy      bladder attached  . Lipoma removal  1999    inner knee  . Hemorroidectomy      Family History  Problem Relation Age of Onset  . Breast cancer Paternal Aunt   . Cancer Paternal Aunt   . Arthritis Mother   . Hypertension Mother   . Cancer Father   . Hyperlipidemia Father   . Diabetes Father     History   Social History  . Marital Status: Married    Spouse Name: N/A  . Number of Children: N/A  . Years of Education: N/A   Occupational History  . Not on file.   Social History Main Topics  . Smoking status: Never Smoker   . Smokeless tobacco: Never Used  . Alcohol Use: No  . Drug Use: No  . Sexual Activity: Not on file   Other Topics Concern  . Not on file   Social History Narrative   The PMH, PSH, Social History,  Family History, Medications, and allergies have been reviewed in [facility], and have been updated if relevant.  Review of Systems  Constitutional: Negative for chills, activity change, appetite change, fatigue and unexpected weight change.  HENT: Negative for congestion, ear discharge, ear pain, facial swelling, hearing loss, mouth sores, nosebleeds, postnasal drip, rhinorrhea, sinus pressure, sneezing, sore throat, tinnitus, trouble swallowing and voice change.   Eyes: Negative for photophobia, pain, discharge, redness, itching and visual disturbance.  Respiratory: Negative.   Cardiovascular: Negative.   Gastrointestinal: Negative.   Endocrine: Negative.   Genitourinary: Negative.   Musculoskeletal: Negative.   Skin: Negative.   Allergic/Immunologic: Negative for food allergies and immunocompromised state.  Neurological: Negative.   Hematological: Negative.   Psychiatric/Behavioral: Negative.   All other systems reviewed and are negative.      Objective:    BP 124/78 mmHg  Pulse 64  Temp(Src) 98 F (36.7 C) (Oral)  Ht 5' 2.5" (1.588 m)  Wt 167 lb 4 oz (75.864 kg)  BMI 30.08 kg/m2  SpO2 98%   Physical Exam  Constitutional: She is oriented to person, place, and time. She appears well-developed and well-nourished. No distress.  HENT:  Head: Normocephalic and atraumatic.  Right Ear: No lacerations. No drainage, swelling or tenderness. No foreign bodies. No mastoid tenderness. Tympanic membrane is not injected, not scarred, not perforated, not erythematous, not retracted and not bulging. Tympanic membrane mobility is normal. No middle ear effusion. No hemotympanum. No decreased hearing is noted.  Left Ear: No lacerations. No drainage, swelling or tenderness. No foreign bodies. No mastoid tenderness. Tympanic membrane is not injected, not scarred, not perforated, not erythematous, not retracted and not bulging. Tympanic membrane mobility is normal.  No middle ear effusion. No hemotympanum.  No decreased hearing is noted.  Nose: No mucosal edema, rhinorrhea or sinus tenderness. Right sinus exhibits no maxillary sinus tenderness and no frontal sinus tenderness. Left sinus exhibits no maxillary sinus tenderness and no frontal sinus tenderness.  Mouth/Throat: Uvula is midline and mucous membranes are normal. No oropharyngeal exudate, posterior oropharyngeal edema, posterior oropharyngeal erythema or tonsillar abscesses.  Eyes: EOM are normal. Right conjunctiva is not injected. Left conjunctiva is not injected.  Cardiovascular: Normal rate and normal heart sounds.   Pulmonary/Chest: Effort normal and breath sounds normal.  Abdominal: Soft. Bowel sounds are normal.  Genitourinary: Vagina normal. No breast swelling, tenderness, discharge or bleeding. There is  no rash or tenderness on the right labia. There is no rash or tenderness on the left labia. Right adnexum displays no mass and no tenderness. Left adnexum displays no mass and no tenderness.  Uterus and cervix absent  Musculoskeletal: Normal range of motion.  Neurological: She is alert and oriented to person, place, and time.  Skin: Skin is warm and dry.  Psychiatric: She has a normal mood and affect. Her behavior is normal. Judgment and thought content normal.          Assessment & Plan:   Hyperlipidemia - Plan: atorvastatin (LIPITOR) 20 MG tablet  Well woman exam with routine gynecological exam  Obesity (BMI 30-39.9)  HLD (hyperlipidemia)  History of pituitary disease No Follow-up on file.

## 2015-02-22 NOTE — Addendum Note (Signed)
Addended by: Modena Nunnery on: 02/22/2015 09:39 AM   Modules accepted: Orders

## 2015-02-22 NOTE — Progress Notes (Signed)
Pre visit review using our clinic review tool, if applicable. No additional management support is needed unless otherwise documented below in the visit note. 

## 2015-02-22 NOTE — Patient Instructions (Signed)
Great to see you. Please schedule follow up in 8 weeks.

## 2015-02-22 NOTE — Assessment & Plan Note (Signed)
Reviewed preventive care protocols, scheduled due services, and updated immunizations Discussed nutrition, exercise, diet, and healthy lifestyle.  

## 2015-02-23 ENCOUNTER — Encounter: Payer: Self-pay | Admitting: Family Medicine

## 2015-02-23 LAB — CYTOLOGY - PAP

## 2015-02-26 ENCOUNTER — Other Ambulatory Visit: Payer: BC Managed Care – PPO

## 2015-02-26 ENCOUNTER — Encounter: Payer: Self-pay | Admitting: *Deleted

## 2015-02-27 ENCOUNTER — Encounter: Payer: Self-pay | Admitting: General Surgery

## 2015-02-27 ENCOUNTER — Ambulatory Visit (INDEPENDENT_AMBULATORY_CARE_PROVIDER_SITE_OTHER): Payer: 59 | Admitting: General Surgery

## 2015-02-27 VITALS — BP 130/70 | HR 72 | Resp 12 | Ht 62.0 in | Wt 166.0 lb

## 2015-02-27 DIAGNOSIS — Z803 Family history of malignant neoplasm of breast: Secondary | ICD-10-CM | POA: Diagnosis not present

## 2015-02-27 DIAGNOSIS — Z87898 Personal history of other specified conditions: Secondary | ICD-10-CM

## 2015-02-27 NOTE — Progress Notes (Signed)
Patient ID: Lindsay Carney, female   DOB: 1955/09/03, 59 y.o.   MRN: [id number]  Chief Complaint  Patient presents with  . Follow-up    mammogram    HPI Lindsay Carney is a 59 y.o. female.  who presents for her follow up mammogam breast evaluation. The most recent mammogram was done on 02-21-15.  Patient does perform regular self breast checks and gets regular mammograms done.  Patient has had full evaluation with Dr. Deborra Medina. She has lost a total of 20 lbs since December 2015.   HPI  Past Medical History  Diagnosis Date  . Diffuse cystic mastopathy   . Thyroid disease 2011  . Hyperlipidemia     Past Surgical History  Procedure Laterality Date  . Knee surgery Right   . Bladder surgery    . Toe surgery    . Colonoscopy  2011  . Breast surgery Right 04/01/10  . Abdominal hysterectomy      bladder attached  . Lipoma removal  1999    inner knee  . Hemorroidectomy      Family History  Problem Relation Age of Onset  . Breast cancer Paternal Aunt   . Cancer Paternal Aunt   . Arthritis Mother   . Hypertension Mother   . Cancer Father   . Hyperlipidemia Father   . Diabetes Father     Social History History  Substance Use Topics  . Smoking status: Never Smoker   . Smokeless tobacco: Never Used  . Alcohol Use: No    No Known Allergies  Current Outpatient Prescriptions  Medication Sig Dispense Refill  . aspirin 81 MG tablet Take 81 mg by mouth daily.    Marland Kitchen atorvastatin (LIPITOR) 10 MG tablet Take 1 tablet (10 mg total) by mouth daily. 90 tablet 3  . cholecalciferol (VITAMIN D) 1000 UNITS tablet Take 1,000 Units by mouth daily.    Marland Kitchen docusate sodium (COLACE) 250 MG capsule Take 250 mg by mouth daily.     Marland Kitchen levothyroxine (SYNTHROID, LEVOTHROID) 75 MCG tablet Take 1 tablet (75 mcg total) by mouth daily before breakfast. 90 tablet 3  . Multiple Vitamins-Minerals (ONE-A-DAY 50 PLUS) TABS Take 1 tablet by mouth daily.    . Omega-3 Fatty Acids (FISH OIL) 1200 MG CAPS Take 1  capsule by mouth daily.     No current facility-administered medications for this visit.    Review of Systems Review of Systems  Constitutional: Negative.   Respiratory: Negative.   Cardiovascular: Negative.     Blood pressure 130/70, pulse 72, resp. rate 12, height 5\' 2"  (1.575 m), weight 166 lb (75.297 kg).  Physical Exam Physical Exam  Constitutional: She is oriented to person, place, and time. She appears well-developed and well-nourished.  Eyes: Conjunctivae are normal. No scleral icterus.  Neck: Neck supple.  Cardiovascular: Normal rate, regular rhythm and normal heart sounds.   Pulmonary/Chest: Effort normal and breath sounds normal. Right breast exhibits no inverted nipple, no mass, no nipple discharge, no skin change and no tenderness. Left breast exhibits no inverted nipple, no mass, no nipple discharge, no skin change and no tenderness.  Abdominal: Soft. Bowel sounds are normal. There is no tenderness.  Lymphadenopathy:    She has no cervical adenopathy.  Neurological: She is alert and oriented to person, place, and time.  Skin: Skin is warm and dry.       Data Reviewed Mammogram reviewed. Stable exam.   Assessment    Fibrocystic breast disease Remote family history of  breast cancer Benign lipomas on back and left arm      Plan    The patient has been asked to return to the office in one year with a bilateral screening mammogram.      PCP:  Mackey Birchwood 02/27/2015, 6:58 PM

## 2015-02-27 NOTE — Patient Instructions (Addendum)
Patient will be asked to return to the office in one year with a bilateral screening mammogram. Continue monthly self breast exam.

## 2015-03-03 ENCOUNTER — Telehealth: Payer: Self-pay | Admitting: General Surgery

## 2015-03-03 NOTE — Telephone Encounter (Signed)
After her visit here last week, received report on mammogram-needs additional views on left breast. Pt was advised. Will get the additional views. If [place] is required pt wishes to have it here in office If additional views are normal, will arrange f/u as necessary

## 2015-03-05 ENCOUNTER — Encounter: Payer: BC Managed Care – PPO | Admitting: Family Medicine

## 2015-03-05 NOTE — Telephone Encounter (Signed)
Patient has been scheduled for left breast additional views at [facility] for 03-09-15 at 1:30 pm. She is aware of date, time, and instructions. This patient verbalizes understanding.

## 2015-03-09 ENCOUNTER — Ambulatory Visit: Payer: 59 | Admitting: Dietician

## 2015-03-12 ENCOUNTER — Ambulatory Visit: Payer: 59 | Admitting: General Surgery

## 2015-03-13 ENCOUNTER — Telehealth: Payer: Self-pay | Admitting: Family Medicine

## 2015-03-13 ENCOUNTER — Encounter: Payer: Self-pay | Admitting: General Surgery

## 2015-03-13 NOTE — Telephone Encounter (Signed)
Pt saw that she needs a hep c test done on my chart. Pt wants to know what this is and if she can have it done at her lab appt on 9/27 Cb number [place] [phone]

## 2015-03-14 NOTE — Telephone Encounter (Signed)
Lm on pts vm requesting a call back 

## 2015-03-14 NOTE — Telephone Encounter (Signed)
The CDC recommends screening for Hepatitis C, a virus that can lead to cirrhosis of the liver and liver cancer, in patients born between 1945-1965. If you would like to have this done at your next lab appt, let me know and I can order it.

## 2015-03-15 ENCOUNTER — Other Ambulatory Visit: Payer: Self-pay | Admitting: Internal Medicine

## 2015-03-15 ENCOUNTER — Telehealth: Payer: Self-pay

## 2015-03-15 DIAGNOSIS — Z1159 Encounter for screening for other viral diseases: Secondary | ICD-10-CM

## 2015-03-15 NOTE — Telephone Encounter (Signed)
PLEASE NOTE: All timestamps contained within this report are represented as [place] Standard Time. CONFIDENTIALTY NOTICE: This fax transmission is intended only for the addressee. It contains information that is legally privileged, confidential or otherwise protected from use or disclosure. If you are not the intended recipient, you are strictly prohibited from reviewing, disclosing, copying using or disseminating any of this information or taking any action in reliance on or regarding this information. If you have received this fax in error, please notify us immediately by telephone so that we can arrange for its return to [place]. Phone: [phone], Toll-Free: [phone], Fax: [phone] Page: 1 of 1 Call Id: [id number] [place] Patient Name: Lindsay Carney Gender: Female DOB: 1956/04/22 Age: 59 Y 13 D Return Phone Number: [phone] (Primary), [phone] (Secondary) Address: City/State/Zip: [place] Client [place] [facility] - Client Client Site [place] Type Call Caller Name [place] Phone Number [phone] Relationship To Patient Self Is this call to report lab results? No Call Type General Information Initial Comment caller is returning a call to the ofc - will be at the primary number tomorrow General Information Type Message Only Nurse Assessment Guidelines Guideline Title Affirmed Question Affirmed Notes Nurse Date/Time (Eastern Time) Disp. Time Eilene Ghazi Time) Disposition Final User 03/14/2015 5:32:10 PM General Information Provided Yes [place], Audrea Muscat After Care Instructions Given Call Event Type User Date / Time Description

## 2015-03-15 NOTE — Telephone Encounter (Signed)
Future Hep C lab ordered to be drawn with 04/2015 labwork

## 2015-03-15 NOTE — Telephone Encounter (Signed)
Spoke to pt and advised. States that she is wanting to have it completed with Sept bloodwork

## 2015-03-16 ENCOUNTER — Encounter: Payer: 59 | Attending: Family Medicine | Admitting: Dietician

## 2015-03-16 ENCOUNTER — Encounter: Payer: Self-pay | Admitting: Dietician

## 2015-03-16 VITALS — Ht 62.75 in | Wt 166.3 lb

## 2015-03-16 DIAGNOSIS — E785 Hyperlipidemia, unspecified: Secondary | ICD-10-CM | POA: Diagnosis present

## 2015-03-16 DIAGNOSIS — R7309 Other abnormal glucose: Secondary | ICD-10-CM | POA: Insufficient documentation

## 2015-03-16 DIAGNOSIS — E669 Obesity, unspecified: Secondary | ICD-10-CM | POA: Diagnosis not present

## 2015-03-16 NOTE — Patient Instructions (Signed)
Continue with previous goals set. When FT job resumes and difficult to exercise 2x/day, continue with once daily on week days and twice daily on weekends. Continue with current meal pattern and balance of carbohydrate, protein, non-starchy vegetables and small amount of added fat.

## 2015-03-16 NOTE — Progress Notes (Signed)
Medical Nutrition Therapy Follow-up visit:  Time:8:00am-8:30am Visit #:7 ASSESSMENT:  Diagnosis:obesity, hyperlipidemia, pre-diabetes  Current weight:166.3 lbs    Height:62.75 in Medications: See list  Progress and evaluation:Patient brought lab work results that were done in July '16. Her lipids had increased in April when she was not taking a statin medication due to issues with insurance coverage.  Compared to lab values last December when she was taking a statin and July, also with a statin but with diet/exercise changes, her cholesterol decreased from 189 to 152. Her triglycerides decreased from 125 to 84 , LDL from 122 to 89 and her HDL increased from 41.6 to 46.  She has lost 2.2 lbs in past 7 weeks since her previous visit. Her BMI is 29.8, down from 33.2 last December. She continues to follow a meal plan of 2-3 servings of carbohydrate per meal, 3 meals per day with protein at each meal. She followed through on goal set at last visit to increase exercise to 2 times per day for 30 minutes. She also is including 3 cups of milk or yogurt daily to help meet calcium needs.  Physical activity:glider 2 x/day for 30 minutes each time   NUTRITION CARE EDUCATION: Weight control: Reviewed lab values commending on her continued healthy diet and exercise habits vs. relying on medication alone. Discussed how her rate of weight gain averaging 3 lbs/month is a steady rate that increases chance of long term success vs. a faster rate. Discussed schedule change as her full-time job resumes in September and how this can affect her meal schedule. Hyperlipidemia: Discussed how her current food choices are typically low in saturated and trans fat and importance of continuing this pattern in efforts to control blood lipids.  INTERVENTION:  Continue with previous goals set. When FT job resumes and difficult to exercise 2x/day, continue with once daily on week days and twice daily on weekends. Continue with  current meal pattern and balance of carbohydrate, protein, non-starchy vegetables and small amount of added fat. EDUCATION MATERIALS GIVEN:  . Goals/ instructions  LEARNER/ who was taught:  . Patient   LEVEL OF UNDERSTANDING: . Verbalizes/ demonstrates competency LEARNING BARRIERS: . None WILLINGNESS TO LEARN/READINESS FOR CHANGE: . Eager, change in progress  MONITORING AND EVALUATION: Follow-up- 05/11/15 at 8:15am.

## 2015-05-01 ENCOUNTER — Other Ambulatory Visit (INDEPENDENT_AMBULATORY_CARE_PROVIDER_SITE_OTHER): Payer: 59

## 2015-05-01 ENCOUNTER — Other Ambulatory Visit: Payer: Self-pay | Admitting: Internal Medicine

## 2015-05-01 ENCOUNTER — Telehealth: Payer: Self-pay | Admitting: Family Medicine

## 2015-05-01 DIAGNOSIS — E785 Hyperlipidemia, unspecified: Secondary | ICD-10-CM

## 2015-05-01 DIAGNOSIS — Z1159 Encounter for screening for other viral diseases: Secondary | ICD-10-CM

## 2015-05-01 LAB — LIPID PANEL
CHOL/HDL RATIO: 4
Cholesterol: 166 mg/dL (ref 0–200)
HDL: 45.6 mg/dL (ref >39.00)
LDL CALC: 96 mg/dL (ref 0–99)
NonHDL: 120.1
TRIGLYCERIDES: 122 mg/dL (ref 0.0–149.0)
VLDL: 24.4 mg/dL (ref 0.0–40.0)

## 2015-05-01 LAB — COMPREHENSIVE METABOLIC PANEL
ALT: 11 U/L (ref 0–35)
AST: 13 U/L (ref 0–37)
Albumin: 4.3 g/dL (ref 3.5–5.2)
Alkaline Phosphatase: 72 U/L (ref 39–117)
BUN: 12 mg/dL (ref 6–23)
CALCIUM: 9.4 mg/dL (ref 8.4–10.5)
CHLORIDE: 105 meq/L (ref 96–112)
CO2: 31 mEq/L (ref 19–32)
Creatinine, Ser: 0.59 mg/dL (ref 0.40–1.20)
GFR: 111.6 mL/min (ref >60.00)
Glucose, Bld: 95 mg/dL (ref 70–99)
POTASSIUM: 3.9 meq/L (ref 3.5–5.1)
Sodium: 142 mEq/L (ref 135–145)
Total Bilirubin: 0.7 mg/dL (ref 0.2–1.2)
Total Protein: 6.8 g/dL (ref 6.0–8.3)

## 2015-05-01 NOTE — Telephone Encounter (Signed)
Pt notified, she will call back if she needs to have the TB Gold this year.

## 2015-05-01 NOTE — Telephone Encounter (Signed)
Pt had labs done today and wanted to know if you could do a test for TB this way She stated she had labs for this one other time

## 2015-05-01 NOTE — Telephone Encounter (Signed)
Please inform pt

## 2015-05-01 NOTE — Telephone Encounter (Signed)
See below.  Is it too late to add?

## 2015-05-01 NOTE — Telephone Encounter (Signed)
No, has to be 3 special tubes of blood that are used just for TB Gold.

## 2015-05-02 LAB — HEPATITIS C ANTIBODY: HCV AB: NEGATIVE

## 2015-05-11 ENCOUNTER — Ambulatory Visit: Payer: 59 | Admitting: Dietician

## 2015-05-11 ENCOUNTER — Telehealth: Payer: Self-pay | Admitting: Family Medicine

## 2015-05-11 NOTE — Telephone Encounter (Signed)
Pt called. She does not have future cpe to discuss lab results. They were only 8 week re-check labs in Sept 2016. Pt is not due for cpe until 02/2016. Pt would like to know if she can stop lipitor? Patient wants to know if she can make appt for labs to be rechecked in Dec 2016 and visit after?  Home # [phone] Cell [phone]

## 2015-05-11 NOTE — Telephone Encounter (Signed)
Her labs are fantastic.  It is a low dose lipitor and working really well.  If she truly wants to stop taking it, she can and we can recheck again as she requests.

## 2015-05-14 NOTE — Telephone Encounter (Signed)
Spoke to pt and advised per Dr Deborra Medina. Pt will stop lipid meds, and f/u labs and appt scheduled.

## 2015-06-04 ENCOUNTER — Telehealth: Payer: Self-pay | Admitting: Family Medicine

## 2015-06-04 NOTE — Telephone Encounter (Signed)
Chart updated to reflect immunizations.

## 2015-06-04 NOTE — Telephone Encounter (Signed)
Patient called to get her mychart updated. Patient had her flu shot on 05/31/15 at [facility].

## 2015-07-17 ENCOUNTER — Other Ambulatory Visit: Payer: Self-pay | Admitting: Family Medicine

## 2015-07-19 ENCOUNTER — Other Ambulatory Visit: Payer: Self-pay | Admitting: Family Medicine

## 2015-07-19 DIAGNOSIS — E785 Hyperlipidemia, unspecified: Secondary | ICD-10-CM

## 2015-07-19 DIAGNOSIS — Z01419 Encounter for gynecological examination (general) (routine) without abnormal findings: Secondary | ICD-10-CM

## 2015-07-19 DIAGNOSIS — E038 Other specified hypothyroidism: Secondary | ICD-10-CM

## 2015-07-23 ENCOUNTER — Other Ambulatory Visit (INDEPENDENT_AMBULATORY_CARE_PROVIDER_SITE_OTHER): Payer: 59

## 2015-07-23 DIAGNOSIS — E785 Hyperlipidemia, unspecified: Secondary | ICD-10-CM | POA: Diagnosis not present

## 2015-07-23 DIAGNOSIS — Z01419 Encounter for gynecological examination (general) (routine) without abnormal findings: Secondary | ICD-10-CM | POA: Diagnosis not present

## 2015-07-23 DIAGNOSIS — E038 Other specified hypothyroidism: Secondary | ICD-10-CM

## 2015-07-23 LAB — COMPREHENSIVE METABOLIC PANEL
ALBUMIN: 4.1 g/dL (ref 3.5–5.2)
ALK PHOS: 70 U/L (ref 39–117)
ALT: 12 U/L (ref 0–35)
AST: 13 U/L (ref 0–37)
BILIRUBIN TOTAL: 0.5 mg/dL (ref 0.2–1.2)
BUN: 14 mg/dL (ref 6–23)
CO2: 29 mEq/L (ref 19–32)
Calcium: 9.4 mg/dL (ref 8.4–10.5)
Chloride: 103 mEq/L (ref 96–112)
Creatinine, Ser: 0.55 mg/dL (ref 0.40–1.20)
GFR: 120.92 mL/min (ref >60.00)
GLUCOSE: 94 mg/dL (ref 70–99)
Potassium: 4.1 mEq/L (ref 3.5–5.1)
Sodium: 139 mEq/L (ref 135–145)
TOTAL PROTEIN: 6.8 g/dL (ref 6.0–8.3)

## 2015-07-23 LAB — LIPID PANEL
Cholesterol: 265 mg/dL — ABNORMAL HIGH (ref 0–200)
HDL: 43.3 mg/dL (ref >39.00)
LDL Cholesterol: 192 mg/dL — ABNORMAL HIGH (ref 0–99)
NONHDL: 222.16
Total CHOL/HDL Ratio: 6
Triglycerides: 151 mg/dL — ABNORMAL HIGH (ref 0.0–149.0)
VLDL: 30.2 mg/dL (ref 0.0–40.0)

## 2015-07-23 LAB — CBC WITH DIFFERENTIAL/PLATELET
BASOS ABS: 0 10*3/uL (ref 0.0–0.1)
Basophils Relative: 0.6 % (ref 0.0–3.0)
Eosinophils Absolute: 0.1 10*3/uL (ref 0.0–0.7)
Eosinophils Relative: 1.2 % (ref 0.0–5.0)
HEMATOCRIT: 42.2 % (ref 36.0–46.0)
HEMOGLOBIN: 13.9 g/dL (ref 12.0–15.0)
LYMPHS ABS: 1.4 10*3/uL (ref 0.7–4.0)
LYMPHS PCT: 23.3 % (ref 12.0–46.0)
MCHC: 33.1 g/dL (ref 30.0–36.0)
MCV: 87.6 fl (ref 78.0–100.0)
MONOS PCT: 6.1 % (ref 3.0–12.0)
Monocytes Absolute: 0.4 10*3/uL (ref 0.1–1.0)
NEUTROS PCT: 68.8 % (ref 43.0–77.0)
Neutro Abs: 4.1 10*3/uL (ref 1.4–7.7)
Platelets: 266 10*3/uL (ref 150.0–400.0)
RBC: 4.81 Mil/uL (ref 3.87–5.11)
RDW: 13.5 % (ref 11.5–15.5)
WBC: 5.9 10*3/uL (ref 4.0–10.5)

## 2015-07-23 LAB — T4, FREE: Free T4: 0.98 ng/dL (ref 0.60–1.60)

## 2015-07-23 LAB — TSH: TSH: 2.01 u[IU]/mL (ref 0.35–4.50)

## 2015-08-01 ENCOUNTER — Encounter: Payer: Self-pay | Admitting: Family Medicine

## 2015-08-01 ENCOUNTER — Ambulatory Visit (INDEPENDENT_AMBULATORY_CARE_PROVIDER_SITE_OTHER): Payer: 59 | Admitting: Family Medicine

## 2015-08-01 VITALS — BP 118/74 | HR 62 | Temp 97.7°F | Wt 170.8 lb

## 2015-08-01 DIAGNOSIS — E038 Other specified hypothyroidism: Secondary | ICD-10-CM

## 2015-08-01 DIAGNOSIS — E669 Obesity, unspecified: Secondary | ICD-10-CM | POA: Diagnosis not present

## 2015-08-01 DIAGNOSIS — E785 Hyperlipidemia, unspecified: Secondary | ICD-10-CM

## 2015-08-01 MED ORDER — ATORVASTATIN CALCIUM 10 MG PO TABS: 10.0000 mg | ORAL_TABLET | Freq: Every day | ORAL | Status: DC

## 2015-08-01 NOTE — Assessment & Plan Note (Signed)
Deteriorated. Restart Lipitor 10 mg daily- seems to remain well controlled on low dose. eRx sent.

## 2015-08-01 NOTE — Progress Notes (Signed)
Subjective:   Patient ID: Lindsay Carney, female    DOB: [birth year], 59 y.o.   MRN: [id number]  Lindsay Carney is a pleasant 59 y.o. year old female who presents to clinic today with Follow-up  on 08/01/2015  HPI:  HLD- deteriorated. Was taking Lipitor 10 mg daily but stopped taking it in 05/2015 because she had been working on diet and exercise.  Has been seeing a nutritionist for a year and feels it has been very helpful.  Has lost 16 pounds in 1 year.  Strong FH of HLD.   Lab Results  Component Value Date   CHOL 265* 07/23/2015   HDL 43.30 07/23/2015   LDLCALC 192* 07/23/2015   TRIG 151.0* 07/23/2015   CHOLHDL 6 07/23/2015   Lab Results  Component Value Date   ALT 12 07/23/2015   AST 13 07/23/2015   ALKPHOS 70 07/23/2015   BILITOT 0.5 07/23/2015     Hypothyroidism- euthyroid on synthroid 75 mcg daily. Lab Results  Component Value Date   TSH 2.01 07/23/2015   Current Outpatient Prescriptions on File Prior to Visit  Medication Sig Dispense Refill  . aspirin 81 MG tablet Take 81 mg by mouth daily.    . cholecalciferol (VITAMIN D) 1000 UNITS tablet Take 1,000 Units by mouth daily.    Marland Kitchen docusate sodium (COLACE) 250 MG capsule Take 250 mg by mouth daily.     Marland Kitchen levothyroxine (SYNTHROID, LEVOTHROID) 75 MCG tablet TAKE 1 TABLET ON AN EMPTY STOMACH WITH A GLASS OF WATER AT LEAST 30 TO 60 MINUTES BEFORE BREAKFAST. 90 tablet 1  . Multiple Vitamins-Minerals (ONE-A-DAY 50 PLUS) TABS Take 1 tablet by mouth daily.    . Omega-3 Fatty Acids (FISH OIL) 1200 MG CAPS Take 1 capsule by mouth daily.    Marland Kitchen atorvastatin (LIPITOR) 10 MG tablet Take 1 tablet (10 mg total) by mouth daily. (Patient not taking: Reported on 08/01/2015) 90 tablet 3   No current facility-administered medications on file prior to visit.    No Known Allergies  Past Medical History  Diagnosis Date  . Diffuse cystic mastopathy   . Thyroid disease 2011  . Hyperlipidemia     Past Surgical History  Procedure  Laterality Date  . Knee surgery Right   . Bladder surgery    . Toe surgery    . Colonoscopy  2011  . Breast surgery Right 04/01/10  . Abdominal hysterectomy      bladder attached  . Lipoma removal  1999    inner knee  . Hemorroidectomy      Family History  Problem Relation Age of Onset  . Breast cancer Paternal Aunt   . Cancer Paternal Aunt   . Arthritis Mother   . Hypertension Mother   . Cancer Father   . Hyperlipidemia Father   . Diabetes Father     Social History   Social History  . Marital Status: Married    Spouse Name: N/A  . Number of Children: N/A  . Years of Education: N/A   Occupational History  . Not on file.   Social History Main Topics  . Smoking status: Never Smoker   . Smokeless tobacco: Never Used  . Alcohol Use: No  . Drug Use: No  . Sexual Activity: Not on file   Other Topics Concern  . Not on file   Social History Narrative   The PMH, PSH, Social History, Family History, Medications, and allergies have been reviewed in [facility], and have been  updated if relevant.   Review of Systems  Constitutional: Negative.   Respiratory: Negative.   Cardiovascular: Negative.   Endocrine: Negative.   Musculoskeletal: Negative.   Skin: Negative.   Neurological: Negative.   Hematological: Negative.   Psychiatric/Behavioral: Negative.   All other systems reviewed and are negative.      Objective:    BP 118/74 mmHg  Pulse 62  Temp(Src) 97.7 F (36.5 C) (Oral)  Wt 170 lb 12 oz (77.452 kg)  SpO2 98%  Wt Readings from Last 3 Encounters:  08/01/15 170 lb 12 oz (77.452 kg)  03/16/15 166 lb 4.8 oz (75.433 kg)  02/27/15 166 lb (75.297 kg)     Physical Exam  Constitutional: She is oriented to person, place, and time. She appears well-developed and well-nourished. No distress.  HENT:  Head: Normocephalic.  Eyes: Conjunctivae are normal.  Neck: Normal range of motion. Neck supple. No thyromegaly present.  Cardiovascular: Normal rate and regular  rhythm.   Pulmonary/Chest: Effort normal and breath sounds normal.  Musculoskeletal: Normal range of motion.  Neurological: She is alert and oriented to person, place, and time.  Skin: Skin is warm and dry. She is not diaphoretic.  Psychiatric: She has a normal mood and affect. Her behavior is normal. Judgment and thought content normal.  Nursing note and vitals reviewed.         Assessment & Plan:   HLD (hyperlipidemia)  Other specified hypothyroidism No Follow-up on file.

## 2015-08-01 NOTE — Progress Notes (Signed)
Pre visit review using our clinic review tool, if applicable. No additional management support is needed unless otherwise documented below in the visit note. 

## 2015-08-01 NOTE — Assessment & Plan Note (Signed)
Improving.  Encouraged her to continue working with nutritionist and working on her diet. The patient indicates understanding of these issues and agrees with the plan.

## 2015-08-01 NOTE — Patient Instructions (Signed)
Great to see you. Happy New Year!  Please restart lipitor 10 mg daily.

## 2015-08-01 NOTE — Assessment & Plan Note (Signed)
Euthyroid on current dose of synthroid. No changes made today. 

## 2015-08-02 ENCOUNTER — Ambulatory Visit: Payer: 59 | Admitting: Family Medicine

## 2015-08-03 ENCOUNTER — Encounter: Payer: Self-pay | Admitting: Dietician

## 2015-08-03 ENCOUNTER — Encounter: Payer: 59 | Attending: Family Medicine | Admitting: Dietician

## 2015-08-03 VITALS — Ht 62.75 in | Wt 170.2 lb

## 2015-08-03 DIAGNOSIS — E785 Hyperlipidemia, unspecified: Secondary | ICD-10-CM | POA: Insufficient documentation

## 2015-08-03 DIAGNOSIS — R7303 Prediabetes: Secondary | ICD-10-CM | POA: Diagnosis present

## 2015-08-03 DIAGNOSIS — E669 Obesity, unspecified: Secondary | ICD-10-CM | POA: Diagnosis not present

## 2015-08-03 NOTE — Patient Instructions (Signed)
Resume 1400 calorie meal plan, balancing protein with 2-3 servings of carbohydrate per meal and non-starchy vegetables. Include 3 servings of whole grain per day. Try margarine with plant stanols or sterols. Strive for 10 gms soluble fiber per day. Refer to list.

## 2015-08-03 NOTE — Progress Notes (Signed)
Medical Nutrition Therapy Follow-up visit:  Time with patient: 8:15-9:00am Visit #:8 ASSESSMENT:  Diagnosis:obesity, hyperlipidemia, pre-diabetes  Current weight:170.2 lbs    Height:62.75 in Medications: See list  Progress and evaluation: Patient in for nutrition follow-up. Her last visit was 4 1/2 months ago. She reports that she sprained her ankle 05/2015 which limited her exercise for several weeks. She has experienced a 4 lb weight gain since her last visit which has occurred over the holiday period with more sweets and "events" with food. Her lab work also indicated a significant rise in total cholesterol and LDL cholesterol related to being taken off her statin medication due to normal lab values in 04/2015. At doctor's instruction,she resumed Lipitor 08/01/15. She continues to have a good understanding of the meal plan instructed on and reviewed at previous visits and is motivated to resume that meal plan. Her present diet is adequate in protein, calcium sources mainly through low fat dairy products, fruits/vegetables (5 servings per day). She includes 2 servings of whole grain on most days due to oatmeal at breakfast. She drinks 32 oz. of water daily + 16-24 oz.of skim or low fat milk. Physical activity: walking daily for 15-20 minutes.   NUTRITION CARE EDUCATION: Hyperlipidemia:  Discussed how her lipid panels reinforce the strong role of genetics with her elevated cholesterol. Also, discussed how lifestyle changes over the past year in diet and exercise have helped to be able to maintain normal lipid levels with relatively low dosage of medication. Reviewed dietary guidelines for lowering cholesterol including recommendations for saturated and trans fat, need for soluble fiber and role of plant sterols/stanols and fruits/vegetables.  INTERVENTION:  Resume 1400 calorie meal plan, balancing protein with 2-3 servings of carbohydrate per meal and non-starchy vegetables. Include 3  servings of whole grain per day. Try margarine with plant stanols or sterols. Strive for 10 gms soluble fiber per day. Refer to list.   EDUCATION MATERIALS GIVEN:  . General diet guidelines for Cholesterol-lowering/ Heart health . List of foods and soluble fiber content . List of spreads including those with plant stanols added. . Goals/ instructions  LEARNER/ who was taught:  . Patient  LEVEL OF UNDERSTANDING: . Verbalizes/ demonstrates competency LEARNING BARRIERS: . None  WILLINGNESS TO LEARN/READINESS FOR CHANGE: . Eager, change in progress  MONITORING AND EVALUATION:  No follow-up scheduled. Patient was encouraged to call if desires further help with diet/nutrition.

## 2015-12-27 ENCOUNTER — Telehealth: Payer: Self-pay

## 2015-12-27 NOTE — Telephone Encounter (Signed)
Pt left v/m; [facility] is to fax PA for name brand lipitor. Pt request cb when completed.

## 2016-01-02 MED ORDER — ATORVASTATIN CALCIUM 10 MG PO TABS: 10.0000 mg | ORAL_TABLET | Freq: Every day | ORAL | Status: DC

## 2016-01-02 NOTE — Telephone Encounter (Signed)
PA completed via telephone at [phone]. Per insurance pt's plan coverage does not approve name brand lipitor, but only generic. Pt states that generic is ineffective for her and is not sure what to do at this point. She is not wanting to try generic, but states she has a discount card and medication will only cost $4. Rx sent to [facility] as requested

## 2016-01-11 ENCOUNTER — Other Ambulatory Visit: Payer: Self-pay | Admitting: Family Medicine

## 2016-02-12 ENCOUNTER — Other Ambulatory Visit: Payer: Self-pay | Admitting: Family Medicine

## 2016-02-12 DIAGNOSIS — E785 Hyperlipidemia, unspecified: Secondary | ICD-10-CM

## 2016-02-12 DIAGNOSIS — E038 Other specified hypothyroidism: Secondary | ICD-10-CM

## 2016-02-12 DIAGNOSIS — Z01419 Encounter for gynecological examination (general) (routine) without abnormal findings: Secondary | ICD-10-CM | POA: Insufficient documentation

## 2016-02-18 ENCOUNTER — Other Ambulatory Visit (INDEPENDENT_AMBULATORY_CARE_PROVIDER_SITE_OTHER): Payer: 59

## 2016-02-18 DIAGNOSIS — Z0000 Encounter for general adult medical examination without abnormal findings: Secondary | ICD-10-CM | POA: Diagnosis not present

## 2016-02-18 DIAGNOSIS — E038 Other specified hypothyroidism: Secondary | ICD-10-CM | POA: Diagnosis not present

## 2016-02-18 DIAGNOSIS — E785 Hyperlipidemia, unspecified: Secondary | ICD-10-CM | POA: Diagnosis not present

## 2016-02-18 DIAGNOSIS — Z01419 Encounter for gynecological examination (general) (routine) without abnormal findings: Secondary | ICD-10-CM

## 2016-02-18 LAB — COMPREHENSIVE METABOLIC PANEL
ALBUMIN: 4.4 g/dL (ref 3.5–5.2)
ALT: 15 U/L (ref 0–35)
AST: 14 U/L (ref 0–37)
Alkaline Phosphatase: 65 U/L (ref 39–117)
BUN: 13 mg/dL (ref 6–23)
CALCIUM: 9.5 mg/dL (ref 8.4–10.5)
CHLORIDE: 104 meq/L (ref 96–112)
CO2: 28 mEq/L (ref 19–32)
CREATININE: 0.61 mg/dL (ref 0.40–1.20)
GFR: 107.08 mL/min (ref >60.00)
Glucose, Bld: 96 mg/dL (ref 70–99)
POTASSIUM: 4 meq/L (ref 3.5–5.1)
Sodium: 138 mEq/L (ref 135–145)
Total Bilirubin: 0.7 mg/dL (ref 0.2–1.2)
Total Protein: 7.1 g/dL (ref 6.0–8.3)

## 2016-02-18 LAB — LIPID PANEL
CHOL/HDL RATIO: 4
Cholesterol: 163 mg/dL (ref 0–200)
HDL: 44.3 mg/dL (ref >39.00)
LDL Cholesterol: 98 mg/dL (ref 0–99)
NONHDL: 118.51
Triglycerides: 105 mg/dL (ref 0.0–149.0)
VLDL: 21 mg/dL (ref 0.0–40.0)

## 2016-02-18 LAB — TSH: TSH: 2.73 u[IU]/mL (ref 0.35–4.50)

## 2016-02-19 LAB — CBC WITH DIFFERENTIAL/PLATELET
BASOS PCT: 0.6 % (ref 0.0–3.0)
Basophils Absolute: 0 10*3/uL (ref 0.0–0.1)
EOS PCT: 1.4 % (ref 0.0–5.0)
Eosinophils Absolute: 0.1 10*3/uL (ref 0.0–0.7)
HEMATOCRIT: 41.8 % (ref 36.0–46.0)
HEMOGLOBIN: 14.3 g/dL (ref 12.0–15.0)
LYMPHS PCT: 31.2 % (ref 12.0–46.0)
Lymphs Abs: 1.8 10*3/uL (ref 0.7–4.0)
MCHC: 34.1 g/dL (ref 30.0–36.0)
MCV: 86.3 fl (ref 78.0–100.0)
MONOS PCT: 6.6 % (ref 3.0–12.0)
Monocytes Absolute: 0.4 10*3/uL (ref 0.1–1.0)
Neutro Abs: 3.4 10*3/uL (ref 1.4–7.7)
Neutrophils Relative %: 60.2 % (ref 43.0–77.0)
Platelets: 249 10*3/uL (ref 150.0–400.0)
RBC: 4.84 Mil/uL (ref 3.87–5.11)
RDW: 14 % (ref 11.5–15.5)
WBC: 5.7 10*3/uL (ref 4.0–10.5)

## 2016-02-25 ENCOUNTER — Ambulatory Visit (INDEPENDENT_AMBULATORY_CARE_PROVIDER_SITE_OTHER): Payer: 59 | Admitting: Family Medicine

## 2016-02-25 ENCOUNTER — Encounter: Payer: Self-pay | Admitting: General Surgery

## 2016-02-25 VITALS — BP 124/78 | HR 64 | Temp 97.7°F | Ht 62.5 in | Wt 174.8 lb

## 2016-02-25 DIAGNOSIS — Z87898 Personal history of other specified conditions: Secondary | ICD-10-CM | POA: Diagnosis not present

## 2016-02-25 DIAGNOSIS — Z0000 Encounter for general adult medical examination without abnormal findings: Secondary | ICD-10-CM

## 2016-02-25 DIAGNOSIS — E038 Other specified hypothyroidism: Secondary | ICD-10-CM

## 2016-02-25 DIAGNOSIS — E785 Hyperlipidemia, unspecified: Secondary | ICD-10-CM | POA: Diagnosis not present

## 2016-02-25 DIAGNOSIS — Z01419 Encounter for gynecological examination (general) (routine) without abnormal findings: Secondary | ICD-10-CM

## 2016-02-25 NOTE — Patient Instructions (Signed)
Great to see you! Happy birthday! 

## 2016-02-25 NOTE — Assessment & Plan Note (Signed)
Euthyroid on current dose of synthroid. No changes made to rxs today. 

## 2016-02-25 NOTE — Progress Notes (Signed)
Pre visit review using our clinic review tool, if applicable. No additional management support is needed unless otherwise documented below in the visit note. 

## 2016-02-25 NOTE — Progress Notes (Signed)
Subjective:   Patient ID: Lindsay Carney, female    DOB: 02-23-1956, 60 y.o.   MRN: [id number]  Lindsay Carney is a pleasant 60 y.o. year old female who presents to clinic today with Annual Exam  and follow up of chronic medical conditions on 02/25/2016  HPI:  Remote h/o hysterectomy.  Mammogram this morning. Colonoscopy 03/13/10   HLD- deteriorated. Was taking Lipitor 10 mg daily but stopped taking it in 05/2015 because she had been working on diet and exercise.  Has been seeing a nutritionist for a year and feels it has been very helpful.  Has lost 16 pounds in 1 year.  Strong FH of HLD.   Lab Results  Component Value Date   CHOL 163 02/18/2016   HDL 44.30 02/18/2016   LDLCALC 98 02/18/2016   TRIG 105.0 02/18/2016   CHOLHDL 4 02/18/2016   Lab Results  Component Value Date   ALT 15 02/18/2016   AST 14 02/18/2016   ALKPHOS 65 02/18/2016   BILITOT 0.7 02/18/2016     Hypothyroidism- euthyroid on synthroid 75 mcg daily. Lab Results  Component Value Date   TSH 2.73 02/18/2016   Lab Results  Component Value Date   NA 138 02/18/2016   K 4.0 02/18/2016   CL 104 02/18/2016   CO2 28 02/18/2016   Lab Results  Component Value Date   WBC 5.7 02/18/2016   HGB 14.3 02/18/2016   HCT 41.8 02/18/2016   MCV 86.3 02/18/2016   PLT 249.0 02/18/2016    Current Outpatient Prescriptions on File Prior to Visit  Medication Sig Dispense Refill  . aspirin 81 MG tablet Take 81 mg by mouth daily.    Marland Kitchen atorvastatin (LIPITOR) 10 MG tablet Take 1 tablet (10 mg total) by mouth daily. 30 tablet 5  . cholecalciferol (VITAMIN D) 1000 UNITS tablet Take 1,000 Units by mouth daily.    Marland Kitchen docusate sodium (COLACE) 250 MG capsule Take 250 mg by mouth daily.     Marland Kitchen levothyroxine (SYNTHROID, LEVOTHROID) 75 MCG tablet TAKE 1 TABLET ON AN EMPTY STOMACH WITH A GLASS OF WATER AT LEAST 30 TO 60 MINUTES BEFORE BREAKFAST. 90 tablet 1  . Multiple Vitamins-Minerals (ONE-A-DAY 50 PLUS) TABS Take 1 tablet by  mouth daily.    . Omega-3 Fatty Acids (FISH OIL) 1200 MG CAPS Take 1 capsule by mouth daily.     No current facility-administered medications on file prior to visit.     No Known Allergies  Past Medical History:  Diagnosis Date  . Diffuse cystic mastopathy   . Hyperlipidemia   . Thyroid disease 2011    Past Surgical History:  Procedure Laterality Date  . ABDOMINAL HYSTERECTOMY     bladder attached  . BLADDER SURGERY    . BREAST SURGERY Right 04/01/10  . COLONOSCOPY  2011  . HEMORROIDECTOMY    . KNEE SURGERY Right   . lipoma removal  1999   inner knee  . TOE SURGERY      Family History  Problem Relation Age of Onset  . Breast cancer Paternal Aunt   . Cancer Paternal Aunt   . Arthritis Mother   . Hypertension Mother   . Cancer Father   . Hyperlipidemia Father   . Diabetes Father     Social History   Social History  . Marital status: Married    Spouse name: N/A  . Number of children: N/A  . Years of education: N/A   Occupational History  .  Not on file.   Social History Main Topics  . Smoking status: Never Smoker  . Smokeless tobacco: Never Used  . Alcohol use No  . Drug use: No  . Sexual activity: Not on file   Other Topics Concern  . Not on file   Social History Narrative  . No narrative on file   The PMH, PSH, Social History, Family History, Medications, and allergies have been reviewed in [facility], and have been updated if relevant.   Review of Systems  Constitutional: Negative.   Respiratory: Negative.   Cardiovascular: Negative.   Endocrine: Negative.   Musculoskeletal: Negative.   Skin: Negative.   Neurological: Negative.   Hematological: Negative.   Psychiatric/Behavioral: Negative.   All other systems reviewed and are negative.      Objective:    BP 124/78   Pulse 64   Temp 97.7 F (36.5 C) (Oral)   Ht 5' 2.5" (1.588 m)   Wt 174 lb 12 oz (79.3 kg)   SpO2 97%   BMI 31.45 kg/m   Wt Readings from Last 3 Encounters:  02/25/16  174 lb 12 oz (79.3 kg)  08/03/15 170 lb 3.2 oz (77.2 kg)  08/01/15 170 lb 12 oz (77.5 kg)     Physical Exam  Constitutional: She is oriented to person, place, and time. She appears well-developed and well-nourished. No distress.  HENT:  Head: Normocephalic.  Eyes: Conjunctivae are normal.  Neck: Normal range of motion. Neck supple. No thyromegaly present.  Cardiovascular: Normal rate and regular rhythm.   Pulmonary/Chest: Effort normal and breath sounds normal.  Genitourinary: Vagina normal. Rectal exam shows external hemorrhoid. Pelvic exam was performed with patient supine. Right adnexum displays no mass and no tenderness. Left adnexum displays no tenderness.  Musculoskeletal: Normal range of motion.  Neurological: She is alert and oriented to person, place, and time.  Skin: Skin is warm and dry. She is not diaphoretic.  Psychiatric: She has a normal mood and affect. Her behavior is normal. Judgment and thought content normal.  Nursing note and vitals reviewed.         Assessment & Plan:   Other specified hypothyroidism  HLD (hyperlipidemia)  History of fibrocystic disease of breast No Follow-up on file.

## 2016-02-25 NOTE — Assessment & Plan Note (Signed)
Well controlled on low dose lipitor. No changes made to rxs today. 

## 2016-02-25 NOTE — Assessment & Plan Note (Signed)
Reviewed preventive care protocols, scheduled due services, and updated immunizations Discussed nutrition, exercise, diet, and healthy lifestyle.   BME done today.

## 2016-02-26 ENCOUNTER — Telehealth: Payer: Self-pay

## 2016-02-26 NOTE — Telephone Encounter (Signed)
Pt left /vm requesting cb about info from ins co on how to appeal lipitor. I spoke with pt and pt requested Dr Deborra Medina to write a letter stating this is a letter of appeal for pt to receive lipitor and the reasons pt needs the medication. Mailing address [facility] appeals [address] [place], [place] [address] or fax # [phone]. Pt request when answer from [facility]. Pt also said if can get approved by ins will pay only $ 4.00 per month with savings card and if ins does not approve will get med for $30.00 per month with savings card/

## 2016-03-03 ENCOUNTER — Encounter: Payer: Self-pay | Admitting: *Deleted

## 2016-03-03 NOTE — Telephone Encounter (Signed)
Ok to write a letter stating that pt's cholesterol was NOT well controlled on generic lipitor but has been controlled on trade name lipitor.

## 2016-03-03 NOTE — Telephone Encounter (Signed)
Letter written and faxed as requested.

## 2016-03-04 ENCOUNTER — Encounter: Payer: Self-pay | Admitting: *Deleted

## 2016-03-06 ENCOUNTER — Ambulatory Visit (INDEPENDENT_AMBULATORY_CARE_PROVIDER_SITE_OTHER): Payer: 59 | Admitting: General Surgery

## 2016-03-06 ENCOUNTER — Encounter: Payer: Self-pay | Admitting: General Surgery

## 2016-03-06 VITALS — BP 134/84 | HR 82 | Resp 12 | Ht 62.5 in | Wt 175.0 lb

## 2016-03-06 DIAGNOSIS — Z803 Family history of malignant neoplasm of breast: Secondary | ICD-10-CM | POA: Diagnosis not present

## 2016-03-06 DIAGNOSIS — D171 Benign lipomatous neoplasm of skin and subcutaneous tissue of trunk: Secondary | ICD-10-CM | POA: Diagnosis not present

## 2016-03-06 DIAGNOSIS — Z87898 Personal history of other specified conditions: Secondary | ICD-10-CM

## 2016-03-06 DIAGNOSIS — D1720 Benign lipomatous neoplasm of skin and subcutaneous tissue of unspecified limb: Secondary | ICD-10-CM | POA: Diagnosis not present

## 2016-03-06 NOTE — Progress Notes (Signed)
Patient ID: Lindsay Carney, female   DOB: [birth year], 60 y.o.   MRN: [id number]  Chief Complaint  Patient presents with  . Follow-up    mammogram    HPI Lindsay Carney is a 60 y.o. female who presents for a breast evaluation. The most recent mammogram was done on 02-25-16. Patient does perform regular self breast checks and gets regular mammograms done. No new breast issues. She does admit to some recent hemorrhoid bleeding, although she has not had this today or yesterday. History of hemorrhoids stretches back to before pregnancy. She was scheduled to have a hemorrhoidectomy following delivery but did not keep appointment due to fear of a pain similar to that experienced when the hemorrhoid was lanced. Hemorrhoidal bleeding is not of concern for her today. I have reviewed the history of present illness with the patient.   HPI  Past Medical History:  Diagnosis Date  . Diffuse cystic mastopathy   . Hyperlipidemia   . Thyroid disease 2011    Past Surgical History:  Procedure Laterality Date  . ABDOMINAL HYSTERECTOMY     bladder attached  . BLADDER SURGERY    . BREAST SURGERY Right 04/01/10  . COLONOSCOPY  2011  . HEMORROIDECTOMY     lanced  . KNEE SURGERY Right   . lipoma removal  1999   inner knee  . TOE SURGERY      Family History  Problem Relation Age of Onset  . Arthritis Mother   . Hypertension Mother   . Cancer Father     prostate and mouth  . Hyperlipidemia Father   . Diabetes Father   . Breast cancer Paternal Aunt   . Cancer Paternal Aunt     Social History Social History  Substance Use Topics  . Smoking status: Never Smoker  . Smokeless tobacco: Never Used  . Alcohol use No    No Known Allergies  Current Outpatient Prescriptions  Medication Sig Dispense Refill  . aspirin 81 MG tablet Take 81 mg by mouth daily.    Marland Kitchen atorvastatin (LIPITOR) 10 MG tablet Take 1 tablet (10 mg total) by mouth daily. 30 tablet 5  . cholecalciferol (VITAMIN D) 1000 UNITS  tablet Take 1,000 Units by mouth daily.    Marland Kitchen docusate sodium (COLACE) 250 MG capsule Take 250 mg by mouth daily.     Marland Kitchen levothyroxine (SYNTHROID, LEVOTHROID) 75 MCG tablet TAKE 1 TABLET ON AN EMPTY STOMACH WITH A GLASS OF WATER AT LEAST 30 TO 60 MINUTES BEFORE BREAKFAST. 90 tablet 1  . Multiple Vitamins-Minerals (ONE-A-DAY 50 PLUS) TABS Take 1 tablet by mouth daily.    . Omega-3 Fatty Acids (FISH OIL) 1200 MG CAPS Take 1 capsule by mouth daily.    . Triamcinolone Acetonide (NASACORT AQ NA) Place into the nose at bedtime.     No current facility-administered medications for this visit.     Review of Systems Review of Systems  Constitutional: Negative.   Respiratory: Negative.   Cardiovascular: Negative.   Gastrointestinal: Positive for anal bleeding (hemorrhoidal, latest episode 3 days ago).    Blood pressure 134/84, pulse 82, resp. rate 12, height 5' 2.5" (1.588 m), weight 175 lb (79.4 kg).  Physical Exam Physical Exam  Constitutional: She is oriented to person, place, and time. She appears well-developed and well-nourished.  Eyes: Conjunctivae are normal. No scleral icterus.  Neck: Neck supple.  Cardiovascular: Normal rate, regular rhythm and normal heart sounds.   Pulmonary/Chest: Effort normal and breath sounds normal. Right breast  exhibits no inverted nipple, no mass, no nipple discharge, no skin change and no tenderness. Left breast exhibits no inverted nipple, no mass, no nipple discharge, no skin change and no tenderness.  Abdominal: Soft. There is no tenderness.  Lymphadenopathy:    She has no cervical adenopathy.    She has no axillary adenopathy.  Neurological: She is alert and oriented to person, place, and time.  Skin: Skin is warm and dry.     3 cm and 2 cm lipomas on right back. 1.5 - 2 cm lipoma left upper arm.  Psychiatric: Her behavior is normal.    Data Reviewed Mammogram reviewed. Stable exam.  Assessment    Fibrocystic breast disease.  3 Lipomas,  minimal increase in size from last visit. Asymptomatic. Remote family history of breast cancer.    Plan    The patient has been asked to return to the office in one year with a bilateral screening mammogram. Monitor for hemorrhoid problems and call for increased bleeding or pain.     This information has been scribed by Karie Fetch RN, BSN,BC.   SANKAR,SEEPLAPUTHUR G 03/06/2016, 11:18 AM

## 2016-03-06 NOTE — Patient Instructions (Signed)
The patient is aware to call back for any questions or concerns.  

## 2016-04-03 NOTE — Telephone Encounter (Signed)
Pt left v/m for PA for lipitor. Pt request pier to pier discussion with Market researcher and explain that lipitor worked for pt and pt has been on other meds that did not work from previous physicians. request call [phone]. Case # [id number].

## 2016-04-04 NOTE — Telephone Encounter (Signed)
Will forward to Dr Deborra Medina to complete upon her return on 9/7

## 2016-06-25 ENCOUNTER — Other Ambulatory Visit: Payer: Self-pay | Admitting: Family Medicine

## 2016-07-11 ENCOUNTER — Other Ambulatory Visit: Payer: Self-pay | Admitting: Family Medicine

## 2016-11-13 DIAGNOSIS — M2041 Other hammer toe(s) (acquired), right foot: Secondary | ICD-10-CM | POA: Diagnosis not present

## 2016-11-13 DIAGNOSIS — M67472 Ganglion, left ankle and foot: Secondary | ICD-10-CM | POA: Diagnosis not present

## 2016-11-13 DIAGNOSIS — M2011 Hallux valgus (acquired), right foot: Secondary | ICD-10-CM | POA: Diagnosis not present

## 2017-02-12 ENCOUNTER — Other Ambulatory Visit: Payer: Self-pay | Admitting: Family Medicine

## 2017-02-16 ENCOUNTER — Other Ambulatory Visit: Payer: Self-pay | Admitting: Family Medicine

## 2017-02-16 DIAGNOSIS — E785 Hyperlipidemia, unspecified: Secondary | ICD-10-CM

## 2017-02-16 DIAGNOSIS — Z01419 Encounter for gynecological examination (general) (routine) without abnormal findings: Secondary | ICD-10-CM

## 2017-02-16 DIAGNOSIS — E038 Other specified hypothyroidism: Secondary | ICD-10-CM

## 2017-02-20 ENCOUNTER — Other Ambulatory Visit (INDEPENDENT_AMBULATORY_CARE_PROVIDER_SITE_OTHER): Payer: 59

## 2017-02-20 DIAGNOSIS — Z111 Encounter for screening for respiratory tuberculosis: Secondary | ICD-10-CM

## 2017-02-20 DIAGNOSIS — E038 Other specified hypothyroidism: Secondary | ICD-10-CM

## 2017-02-20 DIAGNOSIS — E785 Hyperlipidemia, unspecified: Secondary | ICD-10-CM | POA: Diagnosis not present

## 2017-02-20 DIAGNOSIS — Z01419 Encounter for gynecological examination (general) (routine) without abnormal findings: Secondary | ICD-10-CM

## 2017-02-20 LAB — COMPREHENSIVE METABOLIC PANEL WITH GFR
ALT: 17 U/L (ref 0–35)
AST: 15 U/L (ref 0–37)
Albumin: 4.6 g/dL (ref 3.5–5.2)
Alkaline Phosphatase: 85 U/L (ref 39–117)
BUN: 10 mg/dL (ref 6–23)
CO2: 29 meq/L (ref 19–32)
Calcium: 9.5 mg/dL (ref 8.4–10.5)
Chloride: 104 meq/L (ref 96–112)
Creatinine, Ser: 0.59 mg/dL (ref 0.40–1.20)
GFR: 110.89 mL/min (ref >60.00)
Glucose, Bld: 107 mg/dL — ABNORMAL HIGH (ref 70–99)
Potassium: 3.5 meq/L (ref 3.5–5.1)
Sodium: 139 meq/L (ref 135–145)
Total Bilirubin: 0.7 mg/dL (ref 0.2–1.2)
Total Protein: 7.1 g/dL (ref 6.0–8.3)

## 2017-02-20 LAB — CBC WITH DIFFERENTIAL/PLATELET
Basophils Absolute: 0 K/uL (ref 0.0–0.1)
Basophils Relative: 0.6 % (ref 0.0–3.0)
Eosinophils Absolute: 0.1 K/uL (ref 0.0–0.7)
Eosinophils Relative: 1.3 % (ref 0.0–5.0)
HCT: 43 % (ref 36.0–46.0)
Hemoglobin: 14.6 g/dL (ref 12.0–15.0)
Lymphocytes Relative: 30 % (ref 12.0–46.0)
Lymphs Abs: 1.4 K/uL (ref 0.7–4.0)
MCHC: 34.1 g/dL (ref 30.0–36.0)
MCV: 84.7 fl (ref 78.0–100.0)
Monocytes Absolute: 0.3 K/uL (ref 0.1–1.0)
Monocytes Relative: 6.6 % (ref 3.0–12.0)
Neutro Abs: 2.9 K/uL (ref 1.4–7.7)
Neutrophils Relative %: 61.5 % (ref 43.0–77.0)
Platelets: 240 K/uL (ref 150.0–400.0)
RBC: 5.07 Mil/uL (ref 3.87–5.11)
RDW: 14.3 % (ref 11.5–15.5)
WBC: 4.7 K/uL (ref 4.0–10.5)

## 2017-02-20 LAB — T4, FREE: Free T4: 0.94 ng/dL (ref 0.60–1.60)

## 2017-02-20 LAB — LIPID PANEL
Cholesterol: 179 mg/dL (ref 0–200)
HDL: 50.2 mg/dL (ref >39.00)
LDL Cholesterol: 105 mg/dL — ABNORMAL HIGH (ref 0–99)
NonHDL: 128.55
Total CHOL/HDL Ratio: 4
Triglycerides: 120 mg/dL (ref 0.0–149.0)
VLDL: 24 mg/dL (ref 0.0–40.0)

## 2017-02-20 LAB — TSH: TSH: 1.54 u[IU]/mL (ref 0.35–4.50)

## 2017-02-23 LAB — QUANTIFERON TB GOLD ASSAY (BLOOD)
Interferon Gamma Release Assay: NEGATIVE
Mitogen-Nil: 8.23 IU/mL
QUANTIFERON TB AG MINUS NIL: 0.03 [IU]/mL
Quantiferon Nil Value: 0.07 IU/mL

## 2017-02-25 ENCOUNTER — Ambulatory Visit (INDEPENDENT_AMBULATORY_CARE_PROVIDER_SITE_OTHER): Payer: 59 | Admitting: Family Medicine

## 2017-02-25 ENCOUNTER — Encounter: Payer: Self-pay | Admitting: Family Medicine

## 2017-02-25 VITALS — BP 130/90 | HR 74 | Ht 62.5 in | Wt 181.0 lb

## 2017-02-25 DIAGNOSIS — Z01419 Encounter for gynecological examination (general) (routine) without abnormal findings: Secondary | ICD-10-CM

## 2017-02-25 DIAGNOSIS — K644 Residual hemorrhoidal skin tags: Secondary | ICD-10-CM

## 2017-02-25 DIAGNOSIS — Z0001 Encounter for general adult medical examination with abnormal findings: Secondary | ICD-10-CM

## 2017-02-25 DIAGNOSIS — E038 Other specified hypothyroidism: Secondary | ICD-10-CM

## 2017-02-25 DIAGNOSIS — E785 Hyperlipidemia, unspecified: Secondary | ICD-10-CM

## 2017-02-25 MED ORDER — LEVOTHYROXINE SODIUM 75 MCG PO TABS: ORAL_TABLET | ORAL | 3 refills | Status: DC

## 2017-02-25 MED ORDER — ATORVASTATIN CALCIUM 10 MG PO TABS: 10.0000 mg | ORAL_TABLET | Freq: Every day | ORAL | 3 refills | Status: DC

## 2017-02-25 NOTE — Progress Notes (Signed)
Subjective:   Patient ID: Lindsay Carney, female    DOB: [birth year], 61 y.o.   MRN: [id number]  Lindsay Carney is a pleasant 61 y.o. year old female who presents to clinic today with Annual Exam  and follow up of chronic medical conditions on 02/25/2017  HPI:  Remote h/o hysterectomy. Mammogram scheduled for tomorrow, sees Dr. Jamal Collin 03/05/2017. Colonoscopy 03/13/10  She is having more bleeding with her hemorrhoids. No pain or itching.  Bleeding is not just with a BM.  Sometimes just has "discharge in her underwear" from her hemorrhoid.  HLD- improved now that she restarted lipitor.  Strong FH of HLD.  Wt Readings from Last 3 Encounters:  02/25/17 181 lb (82.1 kg)  03/06/16 175 lb (79.4 kg)  02/25/16 174 lb 12 oz (79.3 kg)     Lab Results  Component Value Date   CHOL 179 02/20/2017   HDL 50.20 02/20/2017   LDLCALC 105 (H) 02/20/2017   TRIG 120.0 02/20/2017   CHOLHDL 4 02/20/2017   Lab Results  Component Value Date   ALT 17 02/20/2017   AST 15 02/20/2017   ALKPHOS 85 02/20/2017   BILITOT 0.7 02/20/2017     Hypothyroidism- euthyroid on synthroid 75 mcg daily. Lab Results  Component Value Date   TSH 1.54 02/20/2017   Lab Results  Component Value Date   NA 139 02/20/2017   K 3.5 02/20/2017   CL 104 02/20/2017   CO2 29 02/20/2017   Lab Results  Component Value Date   WBC 4.7 02/20/2017   HGB 14.6 02/20/2017   HCT 43.0 02/20/2017   MCV 84.7 02/20/2017   PLT 240.0 02/20/2017    Current Outpatient Prescriptions on File Prior to Visit  Medication Sig Dispense Refill  . aspirin 81 MG tablet Take 81 mg by mouth daily.    . cholecalciferol (VITAMIN D) 1000 UNITS tablet Take 1,000 Units by mouth daily.    Marland Kitchen docusate sodium (COLACE) 250 MG capsule Take 250 mg by mouth daily.     . Multiple Vitamins-Minerals (ONE-A-DAY 50 PLUS) TABS Take 1 tablet by mouth daily.    . Omega-3 Fatty Acids (FISH OIL) 1200 MG CAPS Take 1 capsule by mouth daily.    . Triamcinolone  Acetonide (NASACORT AQ NA) Place into the nose at bedtime.     No current facility-administered medications on file prior to visit.     No Known Allergies  Past Medical History:  Diagnosis Date  . Diffuse cystic mastopathy   . Hyperlipidemia   . Thyroid disease 2011    Past Surgical History:  Procedure Laterality Date  . ABDOMINAL HYSTERECTOMY     bladder attached  . BLADDER SURGERY    . BREAST SURGERY Right 04/01/10  . COLONOSCOPY  2011  . HEMORROIDECTOMY     lanced  . KNEE SURGERY Right   . lipoma removal  1999   inner knee  . TOE SURGERY      Family History  Problem Relation Age of Onset  . Arthritis Mother   . Hypertension Mother   . Cancer Father        prostate and mouth  . Hyperlipidemia Father   . Diabetes Father   . Breast cancer Paternal Aunt   . Cancer Paternal Aunt     Social History   Social History  . Marital status: Married    Spouse name: N/A  . Number of children: N/A  . Years of education: N/A   Occupational History  .  Not on file.   Social History Main Topics  . Smoking status: Never Smoker  . Smokeless tobacco: Never Used  . Alcohol use No  . Drug use: No  . Sexual activity: Not on file   Other Topics Concern  . Not on file   Social History Narrative  . No narrative on file   The PMH, PSH, Social History, Family History, Medications, and allergies have been reviewed in [facility], and have been updated if relevant.   Review of Systems  Constitutional: Negative.   Respiratory: Negative.   Cardiovascular: Negative.   Gastrointestinal:       + bleeding hemorrhoids  Endocrine: Negative.   Musculoskeletal: Negative.   Skin: Negative.   Neurological: Negative.   Hematological: Negative.   Psychiatric/Behavioral: Negative.   All other systems reviewed and are negative.      Objective:    BP 130/90   Pulse 74   Ht 5' 2.5" (1.588 m)   Wt 181 lb (82.1 kg)   SpO2 97%   BMI 32.58 kg/m   Wt Readings from Last 3 Encounters:    02/25/17 181 lb (82.1 kg)  03/06/16 175 lb (79.4 kg)  02/25/16 174 lb 12 oz (79.3 kg)     Physical Exam  Constitutional: She is oriented to person, place, and time. She appears well-developed and well-nourished. No distress.  HENT:  Head: Normocephalic.  Eyes: Conjunctivae are normal.  Neck: Normal range of motion. Neck supple. No thyromegaly present.  Cardiovascular: Normal rate and regular rhythm.   Pulmonary/Chest: Effort normal and breath sounds normal.  Genitourinary: Rectum normal and vagina normal. Rectal exam shows no external hemorrhoid. Pelvic exam was performed with patient supine. Right adnexum displays no mass and no tenderness. Left adnexum displays no tenderness.  Musculoskeletal: Normal range of motion.  Neurological: She is alert and oriented to person, place, and time.  Skin: Skin is warm and dry. She is not diaphoretic.  Psychiatric: She has a normal mood and affect. Her behavior is normal. Judgment and thought content normal.  Nursing note and vitals reviewed.         Assessment & Plan:   Well woman exam  Hyperlipidemia, unspecified hyperlipidemia type  Other specified hypothyroidism No Follow-up on file.

## 2017-02-25 NOTE — Assessment & Plan Note (Signed)
Improved control with low dose statin. No changes made to rxs today.

## 2017-02-25 NOTE — Assessment & Plan Note (Signed)
Symptomatic.  Refer to GI for tx options. The patient indicates understanding of these issues and agrees with the plan.

## 2017-02-25 NOTE — Assessment & Plan Note (Signed)
Reviewed preventive care protocols, scheduled due services, and updated immunizations Discussed nutrition, exercise, diet, and healthy lifestyle.  

## 2017-02-25 NOTE — Assessment & Plan Note (Signed)
Euthyroid on current dose of synthroid. No changes made to rxs.

## 2017-02-25 NOTE — Addendum Note (Signed)
Addended by: Lucille Passy on: 02/25/2017 10:30 AM   Modules accepted: Orders

## 2017-02-25 NOTE — Patient Instructions (Signed)
Great to see you. Please stop by to see Marion on your way out.   

## 2017-02-26 ENCOUNTER — Encounter: Payer: Self-pay | Admitting: General Surgery

## 2017-02-26 DIAGNOSIS — Z1231 Encounter for screening mammogram for malignant neoplasm of breast: Secondary | ICD-10-CM | POA: Diagnosis not present

## 2017-03-05 ENCOUNTER — Encounter: Payer: Self-pay | Admitting: General Surgery

## 2017-03-05 ENCOUNTER — Encounter: Payer: Self-pay | Admitting: *Deleted

## 2017-03-05 ENCOUNTER — Ambulatory Visit (INDEPENDENT_AMBULATORY_CARE_PROVIDER_SITE_OTHER): Payer: 59 | Admitting: General Surgery

## 2017-03-05 DIAGNOSIS — Z87898 Personal history of other specified conditions: Secondary | ICD-10-CM | POA: Diagnosis not present

## 2017-03-05 DIAGNOSIS — Z803 Family history of malignant neoplasm of breast: Secondary | ICD-10-CM | POA: Diagnosis not present

## 2017-03-05 DIAGNOSIS — K643 Fourth degree hemorrhoids: Secondary | ICD-10-CM

## 2017-03-05 DIAGNOSIS — K625 Hemorrhage of anus and rectum: Secondary | ICD-10-CM | POA: Diagnosis not present

## 2017-03-05 LAB — POC HEMOCCULT BLD/STL (OFFICE/1-CARD/DIAGNOSTIC): FECAL OCCULT BLD: NEGATIVE

## 2017-03-05 NOTE — Progress Notes (Signed)
Patient ID: Lindsay Carney, female   DOB: February 27, 1956, 61 y.o.   MRN: [id number]  Chief Complaint  Patient presents with  . Follow-up    HPI Lindsay Carney is a 61 y.o. female who presents for a breast evaluation. The most recent mammogram was done on 02/26/2017. Marland Kitchen  Patient does perform regular self breast checks and gets regular mammograms done.  Patient states she has been having rectal bleeding off and now. She states some clear discharged and some bleeding without having a bowl movement. No pain. Many years of hemorrhoid symptoms, no prior surgery.  HPI  Past Medical History:  Diagnosis Date  . Diffuse cystic mastopathy   . Hyperlipidemia   . Thyroid disease 2011    Past Surgical History:  Procedure Laterality Date  . ABDOMINAL HYSTERECTOMY     bladder attached  . BLADDER SURGERY    . BREAST SURGERY Right 04/01/10  . COLONOSCOPY  2011  . HEMORROIDECTOMY     lanced  . KNEE SURGERY Right   . lipoma removal  1999   inner knee  . TOE SURGERY      Family History  Problem Relation Age of Onset  . Arthritis Mother   . Hypertension Mother   . Cancer Father        prostate and mouth  . Hyperlipidemia Father   . Diabetes Father   . Breast cancer Paternal Aunt   . Cancer Paternal Aunt     Social History Social History  Substance Use Topics  . Smoking status: Never Smoker  . Smokeless tobacco: Never Used  . Alcohol use No    No Known Allergies  Current Outpatient Prescriptions  Medication Sig Dispense Refill  . aspirin 81 MG tablet Take 81 mg by mouth daily.    Marland Kitchen atorvastatin (LIPITOR) 10 MG tablet Take 1 tablet (10 mg total) by mouth daily. 90 tablet 3  . cholecalciferol (VITAMIN D) 1000 UNITS tablet Take 1,000 Units by mouth daily.    Marland Kitchen docusate sodium (COLACE) 250 MG capsule Take 250 mg by mouth daily.     Marland Kitchen levothyroxine (SYNTHROID, LEVOTHROID) 75 MCG tablet TAKE 1 TABLET ON AN EMPTY STOMACH WITH A GLASS OF WATER AT LEAST 30 TO 60 MINUTES BEFORE BREAKFAST. 90  tablet 3  . Multiple Vitamins-Minerals (ONE-A-DAY 50 PLUS) TABS Take 1 tablet by mouth daily.    . Omega-3 Fatty Acids (FISH OIL) 1200 MG CAPS Take 1 capsule by mouth daily.    . Triamcinolone Acetonide (NASACORT AQ NA) Place into the nose at bedtime.     No current facility-administered medications for this visit.     Review of Systems Review of Systems  Constitutional: Negative.   Respiratory: Negative.   Cardiovascular: Negative.   Gastrointestinal: Positive for anal bleeding.    Blood pressure 128/64, pulse 72, resp. rate 12, height 5\' 2"  (1.575 m), weight 181 lb (82.1 kg).  Physical Exam Physical Exam  Constitutional: She is oriented to person, place, and time. She appears well-developed and well-nourished.  HENT:  Head: Normocephalic.  Mouth/Throat: Oropharynx is clear and moist.  Eyes: Conjunctivae are normal. No scleral icterus.  Neck: Neck supple.  Cardiovascular: Normal rate, regular rhythm and normal heart sounds.   Pulmonary/Chest: Effort normal and breath sounds normal. Right breast exhibits no inverted nipple, no mass, no nipple discharge, no skin change and no tenderness. Left breast exhibits no inverted nipple, no mass, no nipple discharge, no skin change and no tenderness.  Abdominal: Soft. Normal appearance  and bowel sounds are normal. She exhibits no mass. There is no hepatomegaly. There is no tenderness. No hernia.  Genitourinary: Rectal exam shows external hemorrhoid and internal hemorrhoid. Rectal exam shows anal tone normal and guaiac negative stool.     Genitourinary Comments: DRE- no palpable masses, scant amount stool , heme neg  Lymphadenopathy:    She has no cervical adenopathy.    She has no axillary adenopathy.  Neurological: She is alert and oriented to person, place, and time.  Skin: Skin is warm and dry.   3 cm and 2 cm lipomas on right back. 1.5 - 2 cm lipoma left upper arm. No change from before  Data Reviewed Mammogram and prior notes  reviewed Colonoscopy in 2011 was normal except for hemorrhoid Assessment    Stable breast exam ,Fibrocystic breast disease.  3 Lipomas no chance since last visit.  Remote family history of breast cancer.  Hemoccult card was negative for blood.  Totally   Prolapsed large internal/external hemorrhoid with irritation-likely cause of reported discharge and blood  Plan   Discussed above in full with pt.Feel removal of the large prolapsed irritated hemorrhoid is indicated. Procedure risks and benefits explained./ Pt is agreeable. Lipomas, stable in size -no need for excision.  Patient to return to her PCP for yearly mammograms and breast checks.   Patient to be scheduled for a hemorrhoidectomy.  The patient is aware to call back for any questions or concerns.  HPI, Physical Exam, Assessment and Plan have been scribed under the direction and in the presence of Mckinley Jewel, MD  Gaspar Cola, CMA I have completed the exam and reviewed the above documentation for accuracy and completeness.  I agree with the above.  Haematologist has been used and any errors in dictation or transcription are unintentional.  Chicquita Mendel G. Jamal Collin, M.D., F.A.C.S.        Junie Panning G 03/05/2017, 9:38 AM

## 2017-03-05 NOTE — Progress Notes (Signed)
Patient's surgery has been scheduled for 03-12-17 at [facility]. It is okay for patient to continue an 81 mg aspirin once daily.

## 2017-03-05 NOTE — Patient Instructions (Signed)
Patient to have a hemorrhoidectomy

## 2017-03-10 ENCOUNTER — Encounter
Admission: RE | Admit: 2017-03-10 | Discharge: 2017-03-10 | Disposition: A | Discharge status: Home / Self Care | Payer: 59 | Source: Ambulatory Visit | Attending: General Surgery | Admitting: General Surgery

## 2017-03-10 HISTORY — DX: Anemia, unspecified: D64.9

## 2017-03-10 HISTORY — DX: Nausea with vomiting, unspecified: R11.2

## 2017-03-10 HISTORY — DX: Other specified postprocedural states: Z98.890

## 2017-03-10 HISTORY — DX: Other complications of anesthesia, initial encounter: T88.59XA

## 2017-03-10 HISTORY — DX: Adverse effect of unspecified anesthetic, initial encounter: T41.45XA

## 2017-03-10 HISTORY — DX: Hypothyroidism, unspecified: E03.9

## 2017-03-10 NOTE — Patient Instructions (Signed)
  Your procedure is scheduled on: 03-12-17 Report to Same Day Surgery 2nd floor medical mall [facility] Entrance-take elevator on left to 2nd floor.  Check in with surgery information desk.) To find out your arrival time please call [phone] between 1PM - 3PM on 03-11-17  Remember: Instructions that are not followed completely may result in serious medical risk, up to and including death, or upon the discretion of your surgeon and anesthesiologist your surgery may need to be rescheduled.    _x___ 1. Do not eat food or drink liquids after midnight. No gum chewing or hard candies.     __x__ 2. No Alcohol for 24 hours before or after surgery.   __x__3. No Smoking for 24 prior to surgery.   ____  4. Bring all medications with you on the day of surgery if instructed.    __x__ 5. Notify your doctor if there is any change in your medical condition     (cold, fever, infections).     Do not wear jewelry, make-up, hairpins, clips or nail polish.  Do not wear lotions, powders, or perfumes. You may wear deodorant.  Do not shave 48 hours prior to surgery. Men may shave face and neck.  Do not bring valuables to the hospital.    [facility] is not responsible for any belongings or valuables.               Contacts, dentures or bridgework may not be worn into surgery.  Leave your suitcase in the car. After surgery it may be brought to your room.  For patients admitted to the hospital, discharge time is determined by your treatment team.   Patients discharged the day of surgery will not be allowed to drive home.  You will need someone to drive you home and stay with you the night of your procedure.    Please read over the following fact sheets that you were given:    _x___ Take anti-hypertensive (unless it includes a diuretic), cardiac, seizure, asthma,     anti-reflux and psychiatric medicines. These include:  1. LEVOTHYROXINE  2.  3.  4.  5.  6.  _X___Fleets enema or Magnesium Citrate  as directed-DO FLEETS ENEMA AT [place] 1 HOUR PRIOR TO ARRIVAL TIME TO HOSPITAL  ____ Use CHG Soap or sage wipes as directed on instruction sheet   ____ Use inhalers on the day of surgery and bring to hospital day of surgery  ____ Stop Metformin and Janumet 2 days prior to surgery.    ____ Take 1/2 of usual insulin dose the night before surgery and none on the morning     surgery.   _x___ Follow recommendations from Cardiologist, Pulmonologist or PCP regarding stopping Aspirin, Coumadin, Pllavix ,Eliquis, Effient, or Pradaxa, and Pletal-PT ALREADY STOPPED ASA ON HER OWN ON 03-06-17  X____Stop Anti-inflammatories such as Advil, Aleve, Ibuprofen, Motrin, NAPROXEN, Naprosyn, Goodies powders or aspirin products. OK to take Tylenol    _x___ Stop supplements until after surgery-STOP FISH OIL NOW-MAY RESUME AFTER SURGERY   ____ Bring C-Pap to the hospital.

## 2017-03-11 ENCOUNTER — Encounter: Payer: Self-pay | Admitting: *Deleted

## 2017-03-12 ENCOUNTER — Encounter
Admission: RE | Disposition: A | Discharge status: Home / Self Care | Payer: Self-pay | Source: Ambulatory Visit | Attending: General Surgery

## 2017-03-12 ENCOUNTER — Ambulatory Visit
Admission: RE | Admit: 2017-03-12 | Discharge: 2017-03-12 | Disposition: A | Discharge status: Home / Self Care | Payer: 59 | Source: Ambulatory Visit | Attending: General Surgery | Admitting: General Surgery

## 2017-03-12 ENCOUNTER — Ambulatory Visit: Payer: 59 | Admitting: Anesthesiology

## 2017-03-12 ENCOUNTER — Encounter: Payer: Self-pay | Admitting: *Deleted

## 2017-03-12 DIAGNOSIS — N6019 Diffuse cystic mastopathy of unspecified breast: Secondary | ICD-10-CM | POA: Insufficient documentation

## 2017-03-12 DIAGNOSIS — E039 Hypothyroidism, unspecified: Secondary | ICD-10-CM | POA: Diagnosis not present

## 2017-03-12 DIAGNOSIS — Z79899 Other long term (current) drug therapy: Secondary | ICD-10-CM | POA: Diagnosis not present

## 2017-03-12 DIAGNOSIS — D171 Benign lipomatous neoplasm of skin and subcutaneous tissue of trunk: Secondary | ICD-10-CM | POA: Diagnosis not present

## 2017-03-12 DIAGNOSIS — K625 Hemorrhage of anus and rectum: Secondary | ICD-10-CM | POA: Diagnosis not present

## 2017-03-12 DIAGNOSIS — D1722 Benign lipomatous neoplasm of skin and subcutaneous tissue of left arm: Secondary | ICD-10-CM | POA: Insufficient documentation

## 2017-03-12 DIAGNOSIS — K643 Fourth degree hemorrhoids: Secondary | ICD-10-CM

## 2017-03-12 DIAGNOSIS — K649 Unspecified hemorrhoids: Secondary | ICD-10-CM | POA: Diagnosis not present

## 2017-03-12 DIAGNOSIS — K648 Other hemorrhoids: Secondary | ICD-10-CM | POA: Insufficient documentation

## 2017-03-12 DIAGNOSIS — E785 Hyperlipidemia, unspecified: Secondary | ICD-10-CM | POA: Insufficient documentation

## 2017-03-12 DIAGNOSIS — Z7982 Long term (current) use of aspirin: Secondary | ICD-10-CM | POA: Diagnosis not present

## 2017-03-12 DIAGNOSIS — K644 Residual hemorrhoidal skin tags: Secondary | ICD-10-CM | POA: Insufficient documentation

## 2017-03-12 HISTORY — PX: HEMORRHOID SURGERY: SHX153

## 2017-03-12 SURGERY — HEMORRHOIDECTOMY
Anesthesia: General | Wound class: Clean Contaminated

## 2017-03-12 MED ORDER — LIDOCAINE HCL (CARDIAC) 20 MG/ML IV SOLN
INTRAVENOUS | Status: DC | PRN
  Administered 2017-03-12: 50 mg via INTRAVENOUS

## 2017-03-12 MED ORDER — FENTANYL CITRATE (PF) 100 MCG/2ML IJ SOLN: 25.0000 ug | INTRAMUSCULAR | Status: DC | PRN

## 2017-03-12 MED ORDER — MIDAZOLAM HCL 2 MG/2ML IJ SOLN
INTRAMUSCULAR | Status: AC
  Filled 2017-03-12: qty 2

## 2017-03-12 MED ORDER — FAMOTIDINE 20 MG PO TABS
20.0000 mg | ORAL_TABLET | Freq: Once | ORAL | Status: AC
  Administered 2017-03-12: 20 mg via ORAL

## 2017-03-12 MED ORDER — PROPOFOL 10 MG/ML IV BOLUS
INTRAVENOUS | Status: AC
  Filled 2017-03-12: qty 20

## 2017-03-12 MED ORDER — BUPIVACAINE HCL (PF) 0.5 % IJ SOLN
INTRAMUSCULAR | Status: AC
  Filled 2017-03-12: qty 30

## 2017-03-12 MED ORDER — BUPIVACAINE LIPOSOME 1.3 % IJ SUSP
INTRAMUSCULAR | Status: AC
  Filled 2017-03-12: qty 20

## 2017-03-12 MED ORDER — PROMETHAZINE HCL 25 MG/ML IJ SOLN: 6.2500 mg | INTRAMUSCULAR | Status: DC | PRN

## 2017-03-12 MED ORDER — OXYCODONE HCL 5 MG PO TABS: 5.0000 mg | ORAL_TABLET | Freq: Once | ORAL | Status: DC | PRN

## 2017-03-12 MED ORDER — FENTANYL CITRATE (PF) 100 MCG/2ML IJ SOLN
INTRAMUSCULAR | Status: AC
  Filled 2017-03-12: qty 2

## 2017-03-12 MED ORDER — OXYCODONE HCL 5 MG/5ML PO SOLN: 5.0000 mg | Freq: Once | ORAL | Status: DC | PRN

## 2017-03-12 MED ORDER — FLEET ENEMA 7-19 GM/118ML RE ENEM: 1.0000 | ENEMA | Freq: Once | RECTAL | Status: DC

## 2017-03-12 MED ORDER — MEPERIDINE HCL 50 MG/ML IJ SOLN: 6.2500 mg | INTRAMUSCULAR | Status: DC | PRN

## 2017-03-12 MED ORDER — LACTATED RINGERS IV SOLN
INTRAVENOUS | Status: DC
  Administered 2017-03-12: 12:00:00 via INTRAVENOUS

## 2017-03-12 MED ORDER — DEXAMETHASONE SODIUM PHOSPHATE 10 MG/ML IJ SOLN
INTRAMUSCULAR | Status: DC | PRN
  Administered 2017-03-12: 10 mg via INTRAVENOUS

## 2017-03-12 MED ORDER — BACITRACIN ZINC 500 UNIT/GM EX OINT
TOPICAL_OINTMENT | CUTANEOUS | Status: AC
  Filled 2017-03-12: qty 28.35

## 2017-03-12 MED ORDER — PROPOFOL 10 MG/ML IV BOLUS
INTRAVENOUS | Status: DC | PRN
Start: 2017-03-12 — End: 2017-03-12
  Administered 2017-03-12: 150 mg via INTRAVENOUS
  Administered 2017-03-12: 50 mg via INTRAVENOUS
  Administered 2017-03-12: 40 mg via INTRAVENOUS

## 2017-03-12 MED ORDER — BACITRACIN ZINC 500 UNIT/GM EX OINT
TOPICAL_OINTMENT | CUTANEOUS | Status: DC | PRN
  Administered 2017-03-12: 1 via TOPICAL

## 2017-03-12 MED ORDER — ONDANSETRON HCL 4 MG/2ML IJ SOLN
INTRAMUSCULAR | Status: DC | PRN
  Administered 2017-03-12: 4 mg via INTRAVENOUS

## 2017-03-12 MED ORDER — FENTANYL CITRATE (PF) 100 MCG/2ML IJ SOLN
INTRAMUSCULAR | Status: DC | PRN
  Administered 2017-03-12: 50 ug via INTRAVENOUS

## 2017-03-12 MED ORDER — MIDAZOLAM HCL 2 MG/2ML IJ SOLN
INTRAMUSCULAR | Status: DC | PRN
  Administered 2017-03-12: 2 mg via INTRAVENOUS

## 2017-03-12 MED ORDER — BUPIVACAINE HCL (PF) 0.5 % IJ SOLN
INTRAMUSCULAR | Status: DC | PRN
  Administered 2017-03-12: 40 mL

## 2017-03-12 MED ORDER — OXYCODONE-ACETAMINOPHEN 5-325 MG PO TABS: 1.0000 | ORAL_TABLET | Freq: Four times a day (QID) | ORAL | 0 refills | Status: DC | PRN

## 2017-03-12 SURGICAL SUPPLY — 25 items
BLADE SURG 15 STRL SS SAFETY (BLADE) ×2 IMPLANT
BRIEF STRETCH MATERNITY 2XLG (MISCELLANEOUS) ×2 IMPLANT
CANISTER SUCT 1200ML W/VALVE (MISCELLANEOUS) ×2 IMPLANT
DRAPE LAPAROTOMY 77X122 PED (DRAPES) ×2 IMPLANT
DRAPE LEGGINS SURG 28X43 STRL (DRAPES) ×2 IMPLANT
DRAPE UNDER BUTTOCK W/FLU (DRAPES) ×2 IMPLANT
ELECT REM PT RETURN 9FT ADLT (ELECTROSURGICAL) ×2
ELECTRODE REM PT RTRN 9FT ADLT (ELECTROSURGICAL) ×1 IMPLANT
GLOVE BIO SURGEON STRL SZ7 (GLOVE) ×2 IMPLANT
GOWN STRL REUS W/ TWL LRG LVL3 (GOWN DISPOSABLE) ×2 IMPLANT
GOWN STRL REUS W/TWL LRG LVL3 (GOWN DISPOSABLE) ×2
KIT RM TURNOVER STRD PROC AR (KITS) ×2 IMPLANT
LABEL OR SOLS (LABEL) ×2 IMPLANT
LIGASURE IMPACT 36 18CM CVD LR (INSTRUMENTS) ×2 IMPLANT
NEEDLE HYPO 25X1 1.5 SAFETY (NEEDLE) ×2 IMPLANT
NS IRRIG 500ML POUR BTL (IV SOLUTION) ×2 IMPLANT
PACK BASIN MINOR ARMC (MISCELLANEOUS) ×2 IMPLANT
PAD OB MATERNITY 4.3X12.25 (PERSONAL CARE ITEMS) ×2 IMPLANT
PAD PREP 24X41 OB/GYN DISP (PERSONAL CARE ITEMS) ×2 IMPLANT
SOL PREP PVP 2OZ (MISCELLANEOUS) ×2
SOLUTION PREP PVP 2OZ (MISCELLANEOUS) ×1 IMPLANT
SURGILUBE 2OZ TUBE FLIPTOP (MISCELLANEOUS) ×2 IMPLANT
SUT MNCRL 3-0 UNDYED SH (SUTURE) ×2 IMPLANT
SUT MONOCRYL 3-0 UNDYED (SUTURE) ×2
SYR CONTROL 10ML (SYRINGE) ×2 IMPLANT

## 2017-03-12 NOTE — Interval H&P Note (Signed)
History and Physical Interval Note:  03/12/2017 12:19 PM  Lindsay Carney  has presented today for surgery, with the diagnosis of hemorrhoids   The various methods of treatment have been discussed with the patient and family. After consideration of risks, benefits and other options for treatment, the patient has consented to  Procedure(s): HEMORRHOIDECTOMY (N/A) as a surgical intervention .  The patient's history has been reviewed, patient examined, no change in status, stable for surgery.  I have reviewed the patient's chart and labs.  Questions were answered to the patient's satisfaction.     SANKAR,SEEPLAPUTHUR G

## 2017-03-12 NOTE — Anesthesia Preprocedure Evaluation (Signed)
Anesthesia Evaluation  Patient identified by MRN, date of birth, ID band Patient awake    Reviewed: Allergy & Precautions, NPO status , Patient's Chart, lab work & pertinent test results  History of Anesthesia Complications (+) PONV and history of anesthetic complications (once after hysterectomy many years ago)  Airway Mallampati: II  TM Distance: >3 FB Neck ROM: Full    Dental no notable dental hx.    Pulmonary neg pulmonary ROS, neg sleep apnea, neg COPD,    breath sounds clear to auscultation- rhonchi (-) wheezing      Cardiovascular Exercise Tolerance: Good (-) hypertension(-) CAD, (-) Past MI and (-) Cardiac Stents  Rhythm:Regular Rate:Normal - Systolic murmurs and - Diastolic murmurs    Neuro/Psych negative neurological ROS  negative psych ROS   GI/Hepatic negative GI ROS, Neg liver ROS,   Endo/Other  neg diabetesHypothyroidism   Renal/GU negative Renal ROS     Musculoskeletal negative musculoskeletal ROS (+)   Abdominal (+) + obese,   Peds  Hematology  (+) anemia ,   Anesthesia Other Findings Past Medical History: No date: Anemia     Comment:  age 61 No date: Complication of anesthesia No date: Diffuse cystic mastopathy No date: Hyperlipidemia No date: Hypothyroidism No date: PONV (postoperative nausea and vomiting)     Comment:  nausea 2011: Thyroid disease   Reproductive/Obstetrics                             Anesthesia Physical Anesthesia Plan  ASA: II  Anesthesia Plan: General   Post-op Pain Management:    Induction: Intravenous  PONV Risk Score and Plan: 3 and Ondansetron, Dexamethasone and Midazolam  Airway Management Planned: LMA  Additional Equipment:   Intra-op Plan:   Post-operative Plan:   Informed Consent: I have reviewed the patients History and Physical, chart, labs and discussed the procedure including the risks, benefits and alternatives for  the proposed anesthesia with the patient or authorized representative who has indicated his/her understanding and acceptance.   Dental advisory given  Plan Discussed with: CRNA and Anesthesiologist  Anesthesia Plan Comments:         Anesthesia Quick Evaluation

## 2017-03-12 NOTE — Anesthesia Post-op Follow-up Note (Signed)
Anesthesia QCDR form completed.        

## 2017-03-12 NOTE — Op Note (Signed)
Preop diagnosis: Prolapsing internal and external hemorrhoids   Post op diagnosis: Same   Operation: Same  Surgeon: Mckinley Jewel  Assistant:     Anesthesia: Gen.  Complications: None  EBL: Minimal  Drains: None  Description: Patient was put to sleep with an LMA and then placed in lithotomy position. Anal area was prepped and draped as sterile field. Timeout was performed. Speculum exam and digital exam was completed. There was a very large 3 cm x 2 cm fibrotic and mildly irritated prolapsing internal/external hemorrhoid located at the 5:00 location the base of this hernia was predominantly internal. With good exposure the base of this was clamped and the hemorrhoid was amputated and sent to pathology the base was then suture ligated with 3-0 Monocryl. Patient also had 2 notable external hemorrhoidal tags the small internal components at the 7:00 and the 12:00 locations these were approximately a centimeter and a half in length and about the half a centimeter wide. These also were removed at the base and the base closed with 3-0 Monocryl. After ensuring hemostasis the area was infiltrated with 20 mL of Exparel mixed with 20 mL of 0.5% Marcaine. The rectum and the anal skin the area was covered with bacitracin ointment and dressed. Patient was subsequently returned recovery room stable condition.

## 2017-03-12 NOTE — Anesthesia Postprocedure Evaluation (Signed)
Anesthesia Post Note  Patient: Lindsay Carney  Procedure(s) Performed: Procedure(s) (LRB): INTERNAL AND EXTERNAL HEMORRHOIDECTOMY (N/A)  Patient location during evaluation: PACU Anesthesia Type: General Level of consciousness: awake and alert and oriented Pain management: pain level controlled Vital Signs Assessment: post-procedure vital signs reviewed and stable Respiratory status: spontaneous breathing, nonlabored ventilation and respiratory function stable Cardiovascular status: blood pressure returned to baseline and stable Postop Assessment: no signs of nausea or vomiting Anesthetic complications: no     Last Vitals:  Vitals:   03/12/17 1356 03/12/17 1414  BP: 131/83 (!) 142/84  Pulse: 64 (!) 57  Resp: 16 16  Temp:  (!) 36.1 C  SpO2: 100% 100%    Last Pain:  Vitals:   03/12/17 1414  TempSrc: Tympanic  PainSc: 0-No pain                 Adriann Thau

## 2017-03-12 NOTE — Anesthesia Procedure Notes (Signed)
Procedure Name: LMA Insertion Date/Time: 03/12/2017 12:49 PM Performed by: Jonna Clark Pre-anesthesia Checklist: Patient identified, Patient being monitored, Timeout performed, Emergency Drugs available and Suction available Patient Re-evaluated:Patient Re-evaluated prior to induction Oxygen Delivery Method: Circle system utilized Preoxygenation: Pre-oxygenation with 100% oxygen Induction Type: IV induction Ventilation: Mask ventilation without difficulty LMA: LMA inserted LMA Size: 3.5 Tube type: Oral Number of attempts: 1 Placement Confirmation: positive ETCO2 and breath sounds checked- equal and bilateral Tube secured with: Tape Dental Injury: Teeth and Oropharynx as per pre-operative assessment

## 2017-03-12 NOTE — Transfer of Care (Signed)
Immediate Anesthesia Transfer of Care Note  Patient: Lindsay Carney  Procedure(s) Performed: Procedure(s): INTERNAL AND EXTERNAL HEMORRHOIDECTOMY (N/A)  Patient Location: PACU  Anesthesia Type:General  Level of Consciousness: awake, oriented and patient cooperative  Airway & Oxygen Therapy: Patient Spontanous Breathing and Patient connected to face mask oxygen  Post-op Assessment: Report given to RN, Post -op Vital signs reviewed and stable and Patient moving all extremities X 4  Post vital signs: Reviewed and stable  Last Vitals:  Vitals:   03/12/17 1148 03/12/17 1341  BP: (!) 157/89 120/87  Pulse: 79 72  Resp: 16   Temp: 36.8 C 36.7 C  SpO2: 100% 99%    Last Pain:  Vitals:   03/12/17 1148  TempSrc: Oral         Complications: No apparent anesthesia complications

## 2017-03-12 NOTE — H&P (View-Only) (Signed)
Patient ID: Lindsay Carney, female   DOB: [birth year], 61 y.o.   MRN: [id number]  Chief Complaint  Patient presents with  . Follow-up    HPI Lindsay Carney is a 61 y.o. female who presents for a breast evaluation. The most recent mammogram was done on 02/26/2017. Marland Kitchen  Patient does perform regular self breast checks and gets regular mammograms done.  Patient states she has been having rectal bleeding off and now. She states some clear discharged and some bleeding without having a bowl movement. No pain. Many years of hemorrhoid symptoms, no prior surgery.  HPI  Past Medical History:  Diagnosis Date  . Diffuse cystic mastopathy   . Hyperlipidemia   . Thyroid disease 2011    Past Surgical History:  Procedure Laterality Date  . ABDOMINAL HYSTERECTOMY     bladder attached  . BLADDER SURGERY    . BREAST SURGERY Right 04/01/10  . COLONOSCOPY  2011  . HEMORROIDECTOMY     lanced  . KNEE SURGERY Right   . lipoma removal  1999   inner knee  . TOE SURGERY      Family History  Problem Relation Age of Onset  . Arthritis Mother   . Hypertension Mother   . Cancer Father        prostate and mouth  . Hyperlipidemia Father   . Diabetes Father   . Breast cancer Paternal Aunt   . Cancer Paternal Aunt     Social History Social History  Substance Use Topics  . Smoking status: Never Smoker  . Smokeless tobacco: Never Used  . Alcohol use No    No Known Allergies  Current Outpatient Prescriptions  Medication Sig Dispense Refill  . aspirin 81 MG tablet Take 81 mg by mouth daily.    Marland Kitchen atorvastatin (LIPITOR) 10 MG tablet Take 1 tablet (10 mg total) by mouth daily. 90 tablet 3  . cholecalciferol (VITAMIN D) 1000 UNITS tablet Take 1,000 Units by mouth daily.    Marland Kitchen docusate sodium (COLACE) 250 MG capsule Take 250 mg by mouth daily.     Marland Kitchen levothyroxine (SYNTHROID, LEVOTHROID) 75 MCG tablet TAKE 1 TABLET ON AN EMPTY STOMACH WITH A GLASS OF WATER AT LEAST 30 TO 60 MINUTES BEFORE BREAKFAST. 90  tablet 3  . Multiple Vitamins-Minerals (ONE-A-DAY 50 PLUS) TABS Take 1 tablet by mouth daily.    . Omega-3 Fatty Acids (FISH OIL) 1200 MG CAPS Take 1 capsule by mouth daily.    . Triamcinolone Acetonide (NASACORT AQ NA) Place into the nose at bedtime.     No current facility-administered medications for this visit.     Review of Systems Review of Systems  Constitutional: Negative.   Respiratory: Negative.   Cardiovascular: Negative.   Gastrointestinal: Positive for anal bleeding.    Blood pressure 128/64, pulse 72, resp. rate 12, height 5\' 2"  (1.575 m), weight 181 lb (82.1 kg).  Physical Exam Physical Exam  Constitutional: She is oriented to person, place, and time. She appears well-developed and well-nourished.  HENT:  Head: Normocephalic.  Mouth/Throat: Oropharynx is clear and moist.  Eyes: Conjunctivae are normal. No scleral icterus.  Neck: Neck supple.  Cardiovascular: Normal rate, regular rhythm and normal heart sounds.   Pulmonary/Chest: Effort normal and breath sounds normal. Right breast exhibits no inverted nipple, no mass, no nipple discharge, no skin change and no tenderness. Left breast exhibits no inverted nipple, no mass, no nipple discharge, no skin change and no tenderness.  Abdominal: Soft. Normal appearance  and bowel sounds are normal. She exhibits no mass. There is no hepatomegaly. There is no tenderness. No hernia.  Genitourinary: Rectal exam shows external hemorrhoid and internal hemorrhoid. Rectal exam shows anal tone normal and guaiac negative stool.     Genitourinary Comments: DRE- no palpable masses, scant amount stool , heme neg  Lymphadenopathy:    She has no cervical adenopathy.    She has no axillary adenopathy.  Neurological: She is alert and oriented to person, place, and time.  Skin: Skin is warm and dry.   3 cm and 2 cm lipomas on right back. 1.5 - 2 cm lipoma left upper arm. No change from before  Data Reviewed Mammogram and prior notes  reviewed Colonoscopy in 2011 was normal except for hemorrhoid Assessment    Stable breast exam ,Fibrocystic breast disease.  3 Lipomas no chance since last visit.  Remote family history of breast cancer.  Hemoccult card was negative for blood.  Totally   Prolapsed large internal/external hemorrhoid with irritation-likely cause of reported discharge and blood  Plan   Discussed above in full with pt.Feel removal of the large prolapsed irritated hemorrhoid is indicated. Procedure risks and benefits explained./ Pt is agreeable. Lipomas, stable in size -no need for excision.  Patient to return to her PCP for yearly mammograms and breast checks.   Patient to be scheduled for a hemorrhoidectomy.  The patient is aware to call back for any questions or concerns.  HPI, Physical Exam, Assessment and Plan have been scribed under the direction and in the presence of Mckinley Jewel, MD  Gaspar Cola, CMA I have completed the exam and reviewed the above documentation for accuracy and completeness.  I agree with the above.  Haematologist has been used and any errors in dictation or transcription are unintentional.  Seeplaputhur G. Jamal Collin, M.D., F.A.C.S.        Junie Panning G 03/05/2017, 9:38 AM

## 2017-03-12 NOTE — Discharge Instructions (Signed)
Surgical Procedures for Hemorrhoids Surgical procedures can be used to treat hemorrhoids. Hemorrhoids are swollen veins that are inside the rectum (internal hemorrhoids) or around the anus (external hemorrhoids). They are caused by increased pressure in the anal area. This pressure may result from straining to have a bowel movement (constipation), diarrhea, pregnancy, obesity, anal sex, or sitting for long periods of time. Hemorrhoids can cause symptoms such as pain and bleeding. Surgery may be needed if diet changes, lifestyle changes, and other treatments do not help your symptoms. Various surgical methods may be used. Three common methods are:  Closed hemorrhoidectomy. The hemorrhoids are surgically removed, and the surgical cuts (incisions) are closed with stitches (sutures).  Open hemorrhoidectomy. The hemorrhoids are surgically removed, but the incisions are allowed to heal without sutures.  Stapled hemorrhoidopexy. The hemorrhoids are removed using a device that takes out a ring of excess tissue.  Tell a health care provider about:  Any allergies you have.  All medicines you are taking, including vitamins, herbs, eye drops, creams, and over-the-counter medicines.  Any problems you or family members have had with anesthetic medicines.  Any blood disorders you have.  Any surgeries you have had.  Any medical conditions you have.  Whether you are pregnant or may be pregnant. What are the risks? Generally, this is a safe procedure. However, problems may occur, including:  Infection.  Bleeding.  Allergic reactions to medicines.  Damage to other structures or organs.  Pain.  Constipation.  Difficulty passing urine.  Narrowing of the anal canal (stenosis).  Difficulty controlling bowel movements (incontinence).  What happens before the procedure?  Ask your health care provider about: ? Changing or stopping your regular medicines. This is especially important if you  are taking diabetes medicines or blood thinners. ? Taking medicines such as aspirin and ibuprofen. These medicines can thin your blood. Do not take these medicines before your procedure if your health care provider instructs you not to.  You may need to have a procedure to examine the inside of your colon with a scope (colonoscopy). Your health care provider may do this to make sure that there are no other causes for your bleeding or pain.  Follow instructions from your health care provider about eating or drinking restrictions.  You may be instructed to take a laxative and an enema to clean out your colon before surgery (bowel prep). Carefully follow instructions from your health care provider about bowel prep.  Ask your health care provider how your surgical site will be marked or identified.  You may be given antibiotic medicine to help prevent infection.  Plan to have someone take you home after the procedure. What happens during the procedure?  To reduce your risk of infection: ? Your health care team will wash or sanitize their hands. ? Your skin will be washed with soap.  An IV tube will be inserted into one of your veins.  You will be given one or more of the following: ? A medicine to help you relax (sedative). ? A medicine to numb the area (local anesthetic). ? A medicine to make you fall asleep (general anesthetic). ? A medicine that is injected into an area of your body to numb everything below the injection site (regional anesthetic).  A lubricating jelly may be placed into your rectum.  Your surgeon will insert a short scope (anoscope) into your rectum to examine the hemorrhoids.  One of the following hemorrhoid procedures will be performed. Closed Hemorrhoidectomy  Your surgeon  will use surgical instruments to open the tissue around the hemorrhoids.  The veins that supply the hemorrhoids will be tied off with a suture.  The hemorrhoids will be removed.  The  tissue that surrounds the hemorrhoids will be closed with sutures that your body can absorb (absorbable sutures). Open Hemorrhoidectomy  The hemorrhoids will be removed with surgical instruments.  The incisions will be left open to heal without sutures. Stapled Hemorrhoidopexy  Your surgeon will use a circular stapling device to remove the hemorrhoids.  The device will be inserted into your anus. It will remove a circular ring of tissue that includes hemorrhoid tissue and some tissue above the hemorrhoids.  The staples in the device will close the edges of removed tissue. This will cut off the blood supply to the hemorrhoids and will pull any remaining hemorrhoids back into place. Each of these procedures may vary among health care providers and hospitals. What happens after the procedure?  Your blood pressure, heart rate, breathing rate, and blood oxygen level will be monitored often until the medicines you were given have worn off.  You will be given pain medicine as needed. This information is not intended to replace advice given to you by your health care provider. Make sure you discuss any questions you have with your health care provider. Document Released: 05/18/2009 Document Revised: 12/27/2015 Document Reviewed: 10/16/2014 Elsevier Interactive Patient Education  2018 [place]   1) The drugs that you were given will stay in your system until tomorrow so for the next 24 hours you should not:  A) Drive an automobile B) Make any legal decisions C) Drink any alcoholic beverage   2) You may resume regular meals tomorrow.  Today it is better to start with liquids and gradually work up to solid foods.  You may eat anything you prefer, but it is better to start with liquids, then soup and crackers, and gradually work up to solid foods.   3) Please notify your doctor immediately if you have any unusual bleeding, trouble breathing,  redness and pain at the surgery site, drainage, fever, or pain not relieved by medication.    4) Additional Instructions:    Please contact your physician with any problems or Same Day Surgery at [phone], Monday through Friday 6 am to 4 pm, or [place] at [facility] number at [phone].

## 2017-03-13 ENCOUNTER — Encounter: Payer: Self-pay | Admitting: General Surgery

## 2017-03-13 LAB — SURGICAL PATHOLOGY

## 2017-03-19 ENCOUNTER — Encounter: Payer: Self-pay | Admitting: General Surgery

## 2017-03-19 ENCOUNTER — Ambulatory Visit (INDEPENDENT_AMBULATORY_CARE_PROVIDER_SITE_OTHER): Payer: 59 | Admitting: General Surgery

## 2017-03-19 VITALS — BP 140/80 | HR 72 | Resp 12 | Ht 62.0 in | Wt 179.0 lb

## 2017-03-19 DIAGNOSIS — K625 Hemorrhage of anus and rectum: Secondary | ICD-10-CM

## 2017-03-19 NOTE — Progress Notes (Signed)
Patient ID: Lindsay Carney, female   DOB: [birth year], 61 y.o.   MRN: [id number]  Chief Complaint  Patient presents with  . Routine Post Op    HPI Lindsay Carney is a 61 y.o. female here today for her post op hemorrhoidectomy done on 03/12/2017. Patient states she is still bleeding but not much pain. HPI  Past Medical History:  Diagnosis Date  . Anemia    age 63  . Complication of anesthesia   . Diffuse cystic mastopathy   . Hyperlipidemia   . Hypothyroidism   . PONV (postoperative nausea and vomiting)    nausea  . Thyroid disease 2011    Past Surgical History:  Procedure Laterality Date  . ABDOMINAL HYSTERECTOMY     bladder attached  . BLADDER SURGERY    . BREAST SURGERY Right 04/01/10  . COLONOSCOPY  2011  . HEMORRHOID SURGERY N/A 03/12/2017   Procedure: INTERNAL AND EXTERNAL HEMORRHOIDECTOMY;  Surgeon: Christene Lye, MD;  Location: [facility] ORS;  Service: General;  Laterality: N/A;  . HEMORROIDECTOMY     lanced  . lipoma removal  1999   inner knee  . TOE SURGERY      Family History  Problem Relation Age of Onset  . Arthritis Mother   . Hypertension Mother   . Cancer Father        prostate and mouth  . Hyperlipidemia Father   . Diabetes Father   . Breast cancer Paternal Aunt   . Cancer Paternal Aunt     Social History Social History  Substance Use Topics  . Smoking status: Never Smoker  . Smokeless tobacco: Never Used  . Alcohol use No    No Known Allergies  Current Outpatient Prescriptions  Medication Sig Dispense Refill  . aspirin 81 MG tablet Take 81 mg by mouth daily with supper.     Marland Kitchen atorvastatin (LIPITOR) 10 MG tablet Take 1 tablet (10 mg total) by mouth daily. (Patient taking differently: Take 10 mg by mouth at bedtime. Can only take brand name cannot take generic) 90 tablet 3  . cholecalciferol (VITAMIN D) 1000 UNITS tablet Take 1,000 Units by mouth daily with supper.     . docusate sodium (COLACE) 250 MG capsule Take 250 mg by mouth daily  with supper.     . levothyroxine (SYNTHROID, LEVOTHROID) 75 MCG tablet TAKE 1 TABLET ON AN EMPTY STOMACH WITH A GLASS OF WATER AT LEAST 30 TO 60 MINUTES BEFORE BREAKFAST. (Patient taking differently: Take 75 mcg by mouth daily before breakfast. TAKE 1 TABLET ON AN EMPTY STOMACH WITH A GLASS OF WATER AT LEAST 30 TO 60 MINUTES BEFORE BREAKFAST.) 90 tablet 3  . Multiple Vitamins-Minerals (ONE-A-DAY 50 PLUS) TABS Take 1 tablet by mouth daily with supper.     . naproxen sodium (ANAPROX) 220 MG tablet Take 220 mg by mouth daily as needed (pain).    . Omega-3 Fatty Acids (FISH OIL) 1200 MG CAPS Take 1,200 mg by mouth daily with supper.     Marland Kitchen oxyCODONE-acetaminophen (ROXICET) 5-325 MG tablet Take 1 tablet by mouth every 6 (six) hours as needed. 20 tablet 0  . Triamcinolone Acetonide (NASACORT AQ NA) Place 1 spray into the nose at bedtime.      No current facility-administered medications for this visit.     Review of Systems Review of Systems  Constitutional: Negative.   Respiratory: Negative.     Blood pressure 140/80, pulse 72, resp. rate 12, height 5\' 2"  (1.575 m), weight  179 lb (81.2 kg).  Physical Exam Physical Exam  Constitutional: She is oriented to person, place, and time. She appears well-developed and well-nourished.  Neurological: She is alert and oriented to person, place, and time.  Skin: Skin is warm.  Anal exam shows clean small open wounds from the hemorrhoidectomy sites. No sign of infection  Data Reviewed Notes reviewed  Pathology-hemorrhoids Assessment    Doing well and as expected following external/internal hemorrhoidectomy.    Plan    Patient to return on one month.    HPI, Physical Exam, Assessment and Plan have been scribed under the direction and in the presence of Mckinley Jewel, MD  Gaspar Cola, CMA  I have completed the exam and reviewed the above documentation for accuracy and completeness.  I agree with the above.  Haematologist has been used and  any errors in dictation or transcription are unintentional.  Seeplaputhur G. Jamal Collin, M.D., F.A.C.S.   Junie Panning G 03/23/2017, 8:15 AM

## 2017-03-19 NOTE — Patient Instructions (Signed)
Return in one month.  

## 2017-04-29 ENCOUNTER — Encounter: Payer: Self-pay | Admitting: General Surgery

## 2017-04-29 ENCOUNTER — Ambulatory Visit (INDEPENDENT_AMBULATORY_CARE_PROVIDER_SITE_OTHER): Payer: 59 | Admitting: General Surgery

## 2017-04-29 VITALS — BP 148/82 | HR 88 | Resp 12 | Ht 62.5 in | Wt 184.0 lb

## 2017-04-29 DIAGNOSIS — K643 Fourth degree hemorrhoids: Secondary | ICD-10-CM

## 2017-04-29 NOTE — Progress Notes (Signed)
Patient ID: Lindsay Carney, female   DOB: November 26, 1955, 61 y.o.   MRN: [id number]  Chief Complaint  Patient presents with  . Follow-up    HPI Lindsay Carney is a 61 y.o. female.  Here today for follow up hemorrhoids and hemorrhoidectomy 03/12/2017. She states no further bleeding, bowels moving regular. She has been applying Neosporin for pain relief.   HPI  Past Medical History:  Diagnosis Date  . Anemia    age 61  . Complication of anesthesia   . Diffuse cystic mastopathy   . Hyperlipidemia   . Hypothyroidism   . PONV (postoperative nausea and vomiting)    nausea  . Thyroid disease 2011    Past Surgical History:  Procedure Laterality Date  . ABDOMINAL HYSTERECTOMY     bladder attached  . BLADDER SURGERY    . BREAST SURGERY Right 04/01/10  . COLONOSCOPY  2011  . HEMORRHOID SURGERY N/A 03/12/2017   Procedure: INTERNAL AND EXTERNAL HEMORRHOIDECTOMY;  Surgeon: Christene Lye, MD;  Location: [facility] ORS;  Service: General;  Laterality: N/A;  . HEMORROIDECTOMY     lanced  . lipoma removal  1999   inner knee  . TOE SURGERY      Family History  Problem Relation Age of Onset  . Arthritis Mother   . Hypertension Mother   . Cancer Father        prostate and mouth  . Hyperlipidemia Father   . Diabetes Father   . Breast cancer Paternal Aunt   . Cancer Paternal Aunt     Social History Social History  Substance Use Topics  . Smoking status: Never Smoker  . Smokeless tobacco: Never Used  . Alcohol use No    No Known Allergies  Current Outpatient Prescriptions  Medication Sig Dispense Refill  . aspirin 81 MG tablet Take 81 mg by mouth daily with supper.     Marland Kitchen atorvastatin (LIPITOR) 10 MG tablet Take 1 tablet (10 mg total) by mouth daily. (Patient taking differently: Take 10 mg by mouth at bedtime. Can only take brand name cannot take generic) 90 tablet 3  . cholecalciferol (VITAMIN D) 1000 UNITS tablet Take 1,000 Units by mouth daily with supper.     . docusate sodium  (COLACE) 250 MG capsule Take 250 mg by mouth daily with supper.     . levothyroxine (SYNTHROID, LEVOTHROID) 75 MCG tablet TAKE 1 TABLET ON AN EMPTY STOMACH WITH A GLASS OF WATER AT LEAST 30 TO 60 MINUTES BEFORE BREAKFAST. (Patient taking differently: Take 75 mcg by mouth daily before breakfast. TAKE 1 TABLET ON AN EMPTY STOMACH WITH A GLASS OF WATER AT LEAST 30 TO 60 MINUTES BEFORE BREAKFAST.) 90 tablet 3  . Multiple Vitamins-Minerals (ONE-A-DAY 50 PLUS) TABS Take 1 tablet by mouth daily with supper.     . naproxen sodium (ANAPROX) 220 MG tablet Take 220 mg by mouth daily as needed (pain).    . Omega-3 Fatty Acids (FISH OIL) 1200 MG CAPS Take 1,200 mg by mouth daily with supper.     . Triamcinolone Acetonide (NASACORT AQ NA) Place 1 spray into the nose at bedtime.      No current facility-administered medications for this visit.     Review of Systems Review of Systems  Constitutional: Negative.   Respiratory: Negative.   Cardiovascular: Negative.     Blood pressure (!) 148/82, pulse 88, resp. rate 12, height 5' 2.5" (1.588 m), weight 184 lb (83.5 kg).  Physical Exam Physical Exam  Constitutional: She is oriented to person, place, and time. She appears well-developed and well-nourished.  Eyes: Conjunctivae are normal. No scleral icterus.  Genitourinary:     Neurological: She is alert and oriented to person, place, and time.  Skin: Skin is warm and dry.  Psychiatric: She has a normal mood and affect. Her behavior is normal.    Data Reviewed Prior notes reviewed.  Assessment    Internal and external hemorrhoids s/p internal and external hemorroidectomy 03/12/2017 - well healed Pt is now asymptomatic.  She still has problem with constipation   Plan      Recommended 1 capful of Miralax a day to prevent hemorrhoids recurrence. Return to clinic if needed.   HPI, Physical Exam, Assessment and Plan have been scribed under the direction and in the presence of Mckinley Jewel, MD Karie Fetch, RN  I have completed the exam and reviewed the above documentation for accuracy and completeness.  I agree with the above.  Haematologist has been used and any errors in dictation or transcription are unintentional.  Tatiyana Foucher G. Jamal Collin, M.D., F.A.C.S.   Junie Panning G 04/29/2017, 2:15 PM

## 2017-04-29 NOTE — Patient Instructions (Signed)
Recommended 1 capful of Miralax a day to prevent hemorrhoids recurrence and adjust as needed. Return to clinic if needed

## 2017-06-16 DIAGNOSIS — Z23 Encounter for immunization: Secondary | ICD-10-CM | POA: Diagnosis not present

## 2017-12-29 ENCOUNTER — Other Ambulatory Visit: Payer: Self-pay | Admitting: Family Medicine

## 2018-01-15 ENCOUNTER — Telehealth: Payer: Self-pay

## 2018-01-15 DIAGNOSIS — E785 Hyperlipidemia, unspecified: Secondary | ICD-10-CM

## 2018-01-15 DIAGNOSIS — Z1239 Encounter for other screening for malignant neoplasm of breast: Secondary | ICD-10-CM

## 2018-01-15 DIAGNOSIS — E038 Other specified hypothyroidism: Secondary | ICD-10-CM

## 2018-01-15 DIAGNOSIS — Z01419 Encounter for gynecological examination (general) (routine) without abnormal findings: Secondary | ICD-10-CM

## 2018-01-15 NOTE — Telephone Encounter (Signed)
TA-Plz see pt req below to have Mammogram at [place]/has not seen you since 7.2018/plz advise/thx dmf   Copied from [place] [id number]. Topic: Referral - Request >> Jan 12, 2018  6:11 PM Lindsay Carney wrote: Reason for CRM: pt called and is wanting to go to [place] [facility] to have a mammogram, call pt to advise or to let know referral has been made

## 2018-01-15 NOTE — Telephone Encounter (Signed)
Mammogram ordered.  Please advise her to schedule CPX when she can but no reason to have CPX before she schedules her mammogram if she is only needing one for screening.

## 2018-01-18 NOTE — Telephone Encounter (Signed)
Yes please add [facility] as well. Thanks!

## 2018-01-18 NOTE — Telephone Encounter (Signed)
TA-Pt is scheduled for a CPE with you but has labs at [facility] scheduled prior to the CPE/Would you like for me to put in future orders for her? CMP; CBC; Lipid; TSH? Plz advise/thx dmf

## 2018-01-27 NOTE — Telephone Encounter (Signed)
Pt aware that orders have been placed for Mammogram and future labs/thx dmf

## 2018-02-14 ENCOUNTER — Other Ambulatory Visit: Payer: Self-pay | Admitting: Family Medicine

## 2018-02-22 ENCOUNTER — Other Ambulatory Visit (INDEPENDENT_AMBULATORY_CARE_PROVIDER_SITE_OTHER): Payer: 59

## 2018-02-22 DIAGNOSIS — E785 Hyperlipidemia, unspecified: Secondary | ICD-10-CM

## 2018-02-22 DIAGNOSIS — Z01419 Encounter for gynecological examination (general) (routine) without abnormal findings: Secondary | ICD-10-CM | POA: Diagnosis not present

## 2018-02-22 DIAGNOSIS — E038 Other specified hypothyroidism: Secondary | ICD-10-CM | POA: Diagnosis not present

## 2018-02-22 LAB — COMPREHENSIVE METABOLIC PANEL
ALT: 42 U/L — ABNORMAL HIGH (ref 0–35)
AST: 31 U/L (ref 0–37)
Albumin: 4.3 g/dL (ref 3.5–5.2)
Alkaline Phosphatase: 91 U/L (ref 39–117)
BUN: 11 mg/dL (ref 6–23)
CHLORIDE: 104 meq/L (ref 96–112)
CO2: 28 meq/L (ref 19–32)
Calcium: 8.9 mg/dL (ref 8.4–10.5)
Creatinine, Ser: 0.59 mg/dL (ref 0.40–1.20)
GFR: 110.51 mL/min (ref >60.00)
GLUCOSE: 102 mg/dL — AB (ref 70–99)
POTASSIUM: 3.7 meq/L (ref 3.5–5.1)
SODIUM: 140 meq/L (ref 135–145)
Total Bilirubin: 0.6 mg/dL (ref 0.2–1.2)
Total Protein: 6.8 g/dL (ref 6.0–8.3)

## 2018-02-22 LAB — CBC WITH DIFFERENTIAL/PLATELET
Basophils Absolute: 0 10*3/uL (ref 0.0–0.1)
Basophils Relative: 0.4 % (ref 0.0–3.0)
EOS ABS: 0.1 10*3/uL (ref 0.0–0.7)
EOS PCT: 1.7 % (ref 0.0–5.0)
HCT: 41.3 % (ref 36.0–46.0)
Hemoglobin: 14.3 g/dL (ref 12.0–15.0)
Lymphocytes Relative: 26.4 % (ref 12.0–46.0)
Lymphs Abs: 1.5 10*3/uL (ref 0.7–4.0)
MCHC: 34.5 g/dL (ref 30.0–36.0)
MCV: 85.3 fl (ref 78.0–100.0)
MONO ABS: 0.4 10*3/uL (ref 0.1–1.0)
Monocytes Relative: 7.5 % (ref 3.0–12.0)
NEUTROS ABS: 3.7 10*3/uL (ref 1.4–7.7)
Neutrophils Relative %: 64 % (ref 43.0–77.0)
PLATELETS: 249 10*3/uL (ref 150.0–400.0)
RBC: 4.84 Mil/uL (ref 3.87–5.11)
RDW: 13.8 % (ref 11.5–15.5)
WBC: 5.8 10*3/uL (ref 4.0–10.5)

## 2018-02-22 LAB — T4, FREE: Free T4: 0.95 ng/dL (ref 0.60–1.60)

## 2018-02-22 LAB — TSH: TSH: 3.36 u[IU]/mL (ref 0.35–4.50)

## 2018-02-22 LAB — LIPID PANEL
CHOLESTEROL: 183 mg/dL (ref 0–200)
HDL: 46 mg/dL (ref >39.00)
LDL CALC: 115 mg/dL — AB (ref 0–99)
NonHDL: 136.76
TRIGLYCERIDES: 111 mg/dL (ref 0.0–149.0)
Total CHOL/HDL Ratio: 4
VLDL: 22.2 mg/dL (ref 0.0–40.0)

## 2018-03-01 ENCOUNTER — Encounter: Payer: Self-pay | Admitting: Family Medicine

## 2018-03-01 ENCOUNTER — Ambulatory Visit (INDEPENDENT_AMBULATORY_CARE_PROVIDER_SITE_OTHER): Payer: 59 | Admitting: Family Medicine

## 2018-03-01 ENCOUNTER — Encounter: Payer: 59 | Admitting: Family Medicine

## 2018-03-01 ENCOUNTER — Telehealth: Payer: Self-pay | Admitting: Family Medicine

## 2018-03-01 VITALS — BP 136/86 | HR 84 | Temp 98.5°F | Ht 62.5 in | Wt 187.4 lb

## 2018-03-01 DIAGNOSIS — Z0000 Encounter for general adult medical examination without abnormal findings: Secondary | ICD-10-CM | POA: Diagnosis not present

## 2018-03-01 DIAGNOSIS — E785 Hyperlipidemia, unspecified: Secondary | ICD-10-CM

## 2018-03-01 DIAGNOSIS — E038 Other specified hypothyroidism: Secondary | ICD-10-CM

## 2018-03-01 DIAGNOSIS — Z1231 Encounter for screening mammogram for malignant neoplasm of breast: Secondary | ICD-10-CM | POA: Diagnosis not present

## 2018-03-01 HISTORY — DX: Encounter for general adult medical examination without abnormal findings: Z00.00

## 2018-03-01 LAB — HM MAMMOGRAPHY

## 2018-03-01 MED ORDER — LIPITOR 10 MG PO TABS: ORAL_TABLET | ORAL | 11 refills | Status: DC

## 2018-03-01 MED ORDER — LEVOTHYROXINE SODIUM 75 MCG PO TABS: ORAL_TABLET | ORAL | 2 refills | Status: DC

## 2018-03-01 NOTE — Telephone Encounter (Signed)
Very pleasant patient . She is asking to transfer to [facility] office since it takes 45 minutes for her to drive here.  I am okay with transfer.

## 2018-03-01 NOTE — Assessment & Plan Note (Signed)
Reviewed preventive care protocols, scheduled due services, and updated immunizations Discussed nutrition, exercise, diet, and healthy lifestyle.  Mammogram scheduled for today.

## 2018-03-01 NOTE — Patient Instructions (Signed)
Great to see you! Happy birthday! 

## 2018-03-01 NOTE — Assessment & Plan Note (Signed)
Clinically euthyroid. Synthroid rx refilld.

## 2018-03-01 NOTE — Progress Notes (Signed)
Subjective:   Patient ID: Lindsay Carney, female    DOB: [birth year], 62 y.o.   MRN: [id number]  Lindsay Carney is a pleasant 62 y.o. year old female who presents to clinic today with Annual Exam (Patient is here today for a CPE.  Fasting labs completed on 7.22.2019.  She states that she just came bacl from vacation.  Mammogram was ordered on 6.14.19.  Needs Lipitor (DAW) to [facility] in [place].  Needs thyroid med sent to [facility] in [place])  on 03/01/2018  HPI:  Health Maintenance  Topic Date Due  . HIV Screening  02/08/2019 (Originally 02/28/1971)  . INFLUENZA VACCINE  03/04/2018  . MAMMOGRAM  02/27/2019  . COLONOSCOPY  03/13/2020  . TETANUS/TDAP  03/02/2024  . Hepatitis C Screening  Completed   Remote h/o hysterectomy. Has mammogram scheduled for today.  Hypothyroidism- Currently on synthroid. Denies any symptoms of hypothyroidism. Lab Results  Component Value Date   TSH 3.36 02/22/2018    HLD- well controlled with lipitor. Lab Results  Component Value Date   CHOL 183 02/22/2018   HDL 46.00 02/22/2018   LDLCALC 115 (H) 02/22/2018   TRIG 111.0 02/22/2018   CHOLHDL 4 02/22/2018   Lab Results  Component Value Date   ALT 42 (H) 02/22/2018   AST 31 02/22/2018   ALKPHOS 91 02/22/2018   BILITOT 0.6 02/22/2018      Current Outpatient Medications on File Prior to Visit  Medication Sig Dispense Refill  . aspirin 81 MG tablet Take 81 mg by mouth daily with supper.     . cholecalciferol (VITAMIN D) 1000 UNITS tablet Take 1,000 Units by mouth daily with supper.     . Multiple Vitamins-Minerals (ONE-A-DAY 50 PLUS) TABS Take 1 tablet by mouth daily with supper.     . naproxen sodium (ANAPROX) 220 MG tablet Take 220 mg by mouth daily as needed (pain).    . Omega-3 Fatty Acids (FISH OIL) 1200 MG CAPS Take 1,200 mg by mouth daily with supper.     . Triamcinolone Acetonide (NASACORT AQ NA) Place 1 spray into the nose at bedtime.      No current facility-administered medications on  file prior to visit.     No Known Allergies  Past Medical History:  Diagnosis Date  . Anemia    age 62  . Complication of anesthesia   . Diffuse cystic mastopathy   . Hyperlipidemia   . Hypothyroidism   . PONV (postoperative nausea and vomiting)    nausea  . Thyroid disease 2011    Past Surgical History:  Procedure Laterality Date  . ABDOMINAL HYSTERECTOMY     bladder attached  . BLADDER SURGERY    . BREAST SURGERY Right 04/01/10  . COLONOSCOPY  2011  . HEMORRHOID SURGERY N/A 03/12/2017   Procedure: INTERNAL AND EXTERNAL HEMORRHOIDECTOMY;  Surgeon: Christene Lye, MD;  Location: [facility] ORS;  Service: General;  Laterality: N/A;  . HEMORROIDECTOMY     lanced  . lipoma removal  1999   inner knee  . TOE SURGERY      Family History  Problem Relation Age of Onset  . Arthritis Mother   . Hypertension Mother   . Cancer Father        prostate and mouth  . Hyperlipidemia Father   . Diabetes Father   . Breast cancer Paternal Aunt   . Cancer Paternal Aunt     Social History   Socioeconomic History  . Marital status: Married  Spouse name: Not on file  . Number of children: Not on file  . Years of education: Not on file  . Highest education level: Not on file  Occupational History  . Not on file  Social Needs  . Financial resource strain: Not on file  . Food insecurity:    Worry: Not on file    Inability: Not on file  . Transportation needs:    Medical: Not on file    Non-medical: Not on file  Tobacco Use  . Smoking status: Never Smoker  . Smokeless tobacco: Never Used  Substance and Sexual Activity  . Alcohol use: No  . Drug use: No  . Sexual activity: Not on file  Lifestyle  . Physical activity:    Days per week: Not on file    Minutes per session: Not on file  . Stress: Not on file  Relationships  . Social connections:    Talks on phone: Not on file    Gets together: Not on file    Attends religious service: Not on file    Active member of  club or organization: Not on file    Attends meetings of clubs or organizations: Not on file    Relationship status: Not on file  . Intimate partner violence:    Fear of current or ex partner: Not on file    Emotionally abused: Not on file    Physically abused: Not on file    Forced sexual activity: Not on file  Other Topics Concern  . Not on file  Social History Narrative  . Not on file   The PMH, PSH, Social History, Family History, Medications, and allergies have been reviewed in [facility], and have been updated if relevant.   Review of Systems  Constitutional: Negative.   HENT: Negative.   Eyes: Negative.   Respiratory: Negative.   Cardiovascular: Negative.   Gastrointestinal: Negative.   Endocrine: Negative.   Genitourinary: Negative.   Musculoskeletal: Negative.   Allergic/Immunologic: Negative.   Neurological: Negative.   Hematological: Negative.   Psychiatric/Behavioral: Negative.   All other systems reviewed and are negative.      Objective:    BP 136/86 (BP Location: Left Arm, Patient Position: Sitting, Cuff Size: Normal)   Pulse 84   Temp 98.5 F (36.9 C) (Oral)   Ht 5' 2.5" (1.588 m)   Wt 187 lb 6.4 oz (85 kg)   SpO2 97%   BMI 33.73 kg/m    Physical Exam   General:  Well-developed,well-nourished,in no acute distress; alert,appropriate and cooperative throughout examination Head:  normocephalic and atraumatic.   Eyes:  vision grossly intact, PERRL Ears:  R ear normal and L ear normal externally, TMs clear bilaterally Nose:  no external deformity.   Mouth:  good dentition.   Neck:  No deformities, masses, or tenderness noted. Breasts:  No mass, nodules, thickening, tenderness, bulging, retraction, inflamation, nipple discharge or skin changes noted.   Lungs:  Normal respiratory effort, chest expands symmetrically. Lungs are clear to auscultation, no crackles or wheezes. Heart:  Normal rate and regular rhythm. S1 and S2 normal without gallop, murmur,  click, rub or other extra sounds. Abdomen:  Bowel sounds positive,abdomen soft and non-tender without masses, organomegaly or hernias noted. Msk:  No deformity or scoliosis noted of thoracic or lumbar spine.   Extremities:  No clubbing, cyanosis, edema, or deformity noted with normal full range of motion of all joints.   Neurologic:  alert & oriented X3 and gait normal.  Skin:  Intact without suspicious lesions or rashes Cervical Nodes:  No lymphadenopathy noted Axillary Nodes:  No palpable lymphadenopathy Psych:  Cognition and judgment appear intact. Alert and cooperative with normal attention span and concentration. No apparent delusions, illusions, hallucinations       Assessment & Plan:   Other specified hypothyroidism  Well woman exam without gynecological exam  Hyperlipidemia, unspecified hyperlipidemia type No follow-ups on file.

## 2018-03-01 NOTE — Telephone Encounter (Unsigned)
Copied from [place] [id number]. Topic: Appointment Scheduling - Scheduling Inquiry for Clinic >> Mar 01, 2018  4:53 PM Percell Belt A wrote: Reason for CRM:  Pt would like to transfer to Dr Terese Door from Dr Deborra Medina due to location.  Dr Deborra Medina already pt note in that she oked the transfer.    Please advise  [phone]

## 2018-03-01 NOTE — Assessment & Plan Note (Signed)
Well controlled on current dose of statin. Rx refilled today.

## 2018-03-02 ENCOUNTER — Encounter: Payer: Self-pay | Admitting: Family Medicine

## 2018-03-02 NOTE — Telephone Encounter (Signed)
Please advise 

## 2018-03-02 NOTE — Progress Notes (Signed)
[facility]/thx dmf

## 2018-03-02 NOTE — Telephone Encounter (Signed)
Ok to transfer please sch appt

## 2018-03-02 NOTE — Telephone Encounter (Signed)
Transfer of care

## 2018-03-12 ENCOUNTER — Encounter: Payer: Self-pay | Admitting: Family Medicine

## 2018-03-12 ENCOUNTER — Ambulatory Visit: Payer: 59 | Admitting: Family Medicine

## 2018-03-12 VITALS — BP 150/98 | HR 83 | Temp 98.0°F | Wt 188.6 lb

## 2018-03-12 DIAGNOSIS — R030 Elevated blood-pressure reading, without diagnosis of hypertension: Secondary | ICD-10-CM

## 2018-03-12 DIAGNOSIS — J029 Acute pharyngitis, unspecified: Secondary | ICD-10-CM | POA: Diagnosis not present

## 2018-03-12 DIAGNOSIS — M545 Low back pain, unspecified: Secondary | ICD-10-CM

## 2018-03-12 DIAGNOSIS — L819 Disorder of pigmentation, unspecified: Secondary | ICD-10-CM

## 2018-03-12 LAB — POCT RAPID STREP A (OFFICE): RAPID STREP A SCREEN: NEGATIVE

## 2018-03-12 MED ORDER — CYCLOBENZAPRINE HCL 10 MG PO TABS: 10.0000 mg | ORAL_TABLET | Freq: Three times a day (TID) | ORAL | 0 refills | Status: DC | PRN

## 2018-03-12 MED ORDER — PREDNISONE 20 MG PO TABS: 40.0000 mg | ORAL_TABLET | Freq: Every day | ORAL | 0 refills | Status: DC

## 2018-03-12 NOTE — Patient Instructions (Signed)
Nice to see you. We will treat your back pain with a short course of prednisone and a muscle relaxer.  The muscle relaxer may make you drowsy. You are likely contagious with your sore throat.  Please be aware of this. If your back pain worsens or you develop fevers, worsening sore throat, numbness, weakness, loss of bowel or bladder function, or any new or changing symptoms please seek medical attention immediately.

## 2018-03-13 DIAGNOSIS — J029 Acute pharyngitis, unspecified: Secondary | ICD-10-CM | POA: Insufficient documentation

## 2018-03-13 DIAGNOSIS — R030 Elevated blood-pressure reading, without diagnosis of hypertension: Secondary | ICD-10-CM | POA: Insufficient documentation

## 2018-03-13 DIAGNOSIS — M545 Low back pain, unspecified: Secondary | ICD-10-CM | POA: Insufficient documentation

## 2018-03-13 DIAGNOSIS — L819 Disorder of pigmentation, unspecified: Secondary | ICD-10-CM

## 2018-03-13 HISTORY — DX: Disorder of pigmentation, unspecified: L81.9

## 2018-03-13 HISTORY — DX: Low back pain, unspecified: M54.50

## 2018-03-13 LAB — BASIC METABOLIC PANEL
BUN: 13 mg/dL (ref 7–25)
CHLORIDE: 103 mmol/L (ref 98–110)
CO2: 30 mmol/L (ref 20–32)
Calcium: 9.7 mg/dL (ref 8.6–10.4)
Creat: 0.62 mg/dL (ref 0.50–0.99)
Glucose, Bld: 101 mg/dL — ABNORMAL HIGH (ref 65–99)
POTASSIUM: 4 mmol/L (ref 3.5–5.3)
Sodium: 141 mmol/L (ref 135–146)

## 2018-03-13 NOTE — Assessment & Plan Note (Addendum)
Acute issue.  Not responding to NSAIDs.  Could represent nerve impingement with radiation around her hip.  Suspect she at least has some muscular strain.  Will treat with a prednisone burst and Flexeril.  If not improving consider imaging.  Given return precautions.  I advised against excessive use of NSAIDs.  We will check renal function given her excessive use of NSAIDs yesterday.

## 2018-03-13 NOTE — Assessment & Plan Note (Signed)
This lesion is concerning for a melanoma.  We will attempt to get her dermatology appointment moved up to earlier next week.  I discussed the concern of this lesion with the patient.  I spoke with our referral coordinator and will send this note to her to try to get the patient's appointment moved up with Dr. Phillip Heal next week.

## 2018-03-13 NOTE — Assessment & Plan Note (Signed)
Elevated likely related to back discomfort.  We will have her return in 1 week for recheck with NP.

## 2018-03-13 NOTE — Progress Notes (Signed)
Tommi Rumps, MD Phone: [phone]  Lindsay Carney is a 62 y.o. female who presents today for same day visit.   CC: back pain, sore throat, toe nail lesion  Low back pain: Notes this started about a week ago.  She notes she twisted her knee previously and had been walking awkwardly and thinks that may have set her back off.  Her knee issue has resolved though her left low back as continued to bother her.  Radiates around her left hip.  Feels like her left leg will give out on her at times.  Over the last couple of days it has been fairly significant discomfort.  She was alternating ibuprofen maximum dose and Aleve maximum dose yesterday.  She notes no numbness.  No saddle anesthesia or bowel or bladder incontinence.  No urinary symptoms.  Sore throat: Patient notes started this morning.  Feels as though she sees a blister on the left side posterior aspect of her oropharynx.  Some cough.  No postnasal drip.  No fevers.  Some phlegm in her throat.  No congestion in her head.  Possible sick contacts.  No history of allergies.  Patient reports she noticed a hyperpigmented lesion underneath her right great toenail in February.  She just called a dermatologist a couple of weeks ago to get set up with them and has an appointment next Wednesday.  Social History   Tobacco Use  Smoking Status Never Smoker  Smokeless Tobacco Never Used     ROS see history of present illness  Objective  Physical Exam Vitals:   03/12/18 1553  BP: (!) 150/98  Pulse: 83  Temp: 98 F (36.7 C)  SpO2: 98%    BP Readings from Last 3 Encounters:  03/12/18 (!) 150/98  03/01/18 136/86  04/29/17 (!) 148/82   Wt Readings from Last 3 Encounters:  03/12/18 188 lb 9.6 oz (85.5 kg)  03/01/18 187 lb 6.4 oz (85 kg)  04/29/17 184 lb (83.5 kg)    Physical Exam  Constitutional: No distress.  HENT:  Head: Normocephalic and atraumatic.  Mild posterior oropharyngeal erythema, no apparent exudate or blistering,  normal TMs  Eyes: Pupils are equal, round, and reactive to light. Conjunctivae are normal.  Neck: Neck supple.  Cardiovascular: Normal rate, regular rhythm and normal heart sounds.  Pulmonary/Chest: Effort normal and breath sounds normal.  Abdominal: Soft. She exhibits no distension. There is no tenderness.  Examined sitting up due to back discomfort  Musculoskeletal: She exhibits no edema.  No midline spine tenderness, no midline spine step-off, there is left lower lumbar muscular tenderness and left SI joint tenderness, no apparent overlying skin changes, 5/5 strength bilateral quads, hamstrings, plantar flexion, and dorsiflexion, sensation to light touch intact bilateral lower extremities, when patient was asked to lay back on the exam table she had fairly significant pain in her left low back with that motion  Lymphadenopathy:    She has no cervical adenopathy.  Neurological: She is alert.  Skin: Skin is warm and dry. She is not diaphoretic.  Right great toenail with hyperpigmented brown/black lesion underneath the toenail with toenail thickening     Assessment/Plan: Please see individual problem list.  Sore throat Negative rapid strep test.  Suspect viral pharyngitis.  Discussed that this is likely contagious.  We are treating her back with prednisone and that may help with some of her symptoms of sore throat.  She will monitor.  Elevated BP without diagnosis of hypertension Elevated likely related to back discomfort.  We will have her return in 1 week for recheck with NP.  Low back pain Acute issue.  Not responding to NSAIDs.  Could represent nerve impingement with radiation around her hip.  Suspect she at least has some muscular strain.  Will treat with a prednisone burst and Flexeril.  If not improving consider imaging.  Given return precautions.  I advised against excessive use of NSAIDs.  We will check renal function given her excessive use of NSAIDs yesterday.  Hyperpigmented  toenail lesion This lesion is concerning for a melanoma.  We will attempt to get her dermatology appointment moved up to earlier next week.  I discussed the concern of this lesion with the patient.  I spoke with our referral coordinator and will send this note to her to try to get the patient's appointment moved up with Dr. Phillip Heal next week.   Orders Placed This Encounter  Procedures  . Basic Metabolic Panel (BMET)  . POCT rapid strep A    Meds ordered this encounter  Medications  . DISCONTD: predniSONE (DELTASONE) 20 MG tablet    Sig: Take 2 tablets (40 mg total) by mouth daily with breakfast.    Dispense:  10 tablet    Refill:  0  . DISCONTD: cyclobenzaprine (FLEXERIL) 10 MG tablet    Sig: Take 1 tablet (10 mg total) by mouth 3 (three) times daily as needed for muscle spasms.    Dispense:  30 tablet    Refill:  0  . cyclobenzaprine (FLEXERIL) 10 MG tablet    Sig: Take 1 tablet (10 mg total) by mouth 3 (three) times daily as needed for muscle spasms.    Dispense:  30 tablet    Refill:  0  . predniSONE (DELTASONE) 20 MG tablet    Sig: Take 2 tablets (40 mg total) by mouth daily with breakfast.    Dispense:  10 tablet    Refill:  0     Tommi Rumps, MD [place]

## 2018-03-13 NOTE — Assessment & Plan Note (Signed)
Negative rapid strep test.  Suspect viral pharyngitis.  Discussed that this is likely contagious.  We are treating her back with prednisone and that may help with some of her symptoms of sore throat.  She will monitor.

## 2018-03-14 DIAGNOSIS — M5416 Radiculopathy, lumbar region: Secondary | ICD-10-CM | POA: Diagnosis not present

## 2018-03-14 DIAGNOSIS — M519 Unspecified thoracic, thoracolumbar and lumbosacral intervertebral disc disorder: Secondary | ICD-10-CM | POA: Diagnosis not present

## 2018-03-15 ENCOUNTER — Telehealth: Payer: Self-pay | Admitting: Family Medicine

## 2018-03-15 DIAGNOSIS — M4716 Other spondylosis with myelopathy, lumbar region: Secondary | ICD-10-CM | POA: Diagnosis not present

## 2018-03-15 NOTE — Telephone Encounter (Signed)
Noted. Thank you for getting her appointment moved up. She should keep the appointment with Lauren.

## 2018-03-15 NOTE — Telephone Encounter (Signed)
Moved pt's appt up with Dr. Phillip Heal to tomorrow (8/13) @ 9:10. She states that she went to [facility] yesterday because her back was still hurting badly. They took xray and said that it looked like degenerative disc disease. They gave her the CD and told her to go [facility] today. She also states that her bp was still high (150/90) but said it was that high because she was in pain. She is coming back to [facility] on Friday.

## 2018-03-16 DIAGNOSIS — M7981 Nontraumatic hematoma of soft tissue: Secondary | ICD-10-CM | POA: Diagnosis not present

## 2018-03-17 ENCOUNTER — Telehealth: Payer: Self-pay | Admitting: Family Medicine

## 2018-03-17 NOTE — Telephone Encounter (Signed)
I called pt to confirm appt on 08/14 and pt stated that she was having some issues with her left leg and left side of her back. Pt came in and saw Dr Caryl Bis he told pt that if she was having some more issues to have an xray pt did have xray. Pt had xray at [facility] and pt was sent to [facility] on Monday and [facility] said next step is PT which is next Wednesday. Pt is going to [facility]. Now pt step up with left foot and it gave out and now pain is not as bad per pt. Pt was also given medication for inflammation and [facility] gave pt pills for pain. I offered pt a sooner appt pt declined and stated she will wait for 08/16.

## 2018-03-19 ENCOUNTER — Encounter: Payer: Self-pay | Admitting: Family Medicine

## 2018-03-19 ENCOUNTER — Ambulatory Visit: Payer: 59 | Admitting: Family Medicine

## 2018-03-19 VITALS — BP 142/88 | HR 106 | Temp 98.4°F | Ht 62.56 in | Wt 186.6 lb

## 2018-03-19 DIAGNOSIS — R030 Elevated blood-pressure reading, without diagnosis of hypertension: Secondary | ICD-10-CM

## 2018-03-19 DIAGNOSIS — M545 Low back pain, unspecified: Secondary | ICD-10-CM

## 2018-03-19 NOTE — Patient Instructions (Signed)
Great to meet you!  Use meloxicam for next month and do PT, then see how you are off of the meloxicam

## 2018-03-19 NOTE — Progress Notes (Signed)
   Subjective:    Patient ID: Lindsay Carney, female    DOB: 05-27-1956, 62 y.o.   MRN: [id number]  HPI  Patient presents to clinic for BP recheck and follow up on low back pain. She is currently using mobic 7.5mg  daily, steroid burst and flexeril as needed.  States her low back pain has improved with this medication, feels the mobic has been the most helpful.  She will be starting PT on 03/24/18 for low back pain.  Notices the pain the most when laying flat on back, sitting up or standing does not seem to exacerbate the pain.   Patient Active Problem List   Diagnosis Date Noted  . Elevated BP without diagnosis of hypertension 03/13/2018  . Sore throat 03/13/2018  . Low back pain 03/13/2018  . Hyperpigmented toenail lesion 03/13/2018  . Well woman exam without gynecological exam 03/01/2018  . Hypothyroidism 07/24/2014  . HLD (hyperlipidemia) 07/24/2014  . Constipation 07/24/2014  . Hot flashes 07/24/2014  . History of pituitary disease 07/24/2014  . Obesity (BMI 30-39.9) 07/24/2014  . History of fibrocystic disease of breast 03/01/2013   Social History   Tobacco Use  . Smoking status: Never Smoker  . Smokeless tobacco: Never Used  Substance Use Topics  . Alcohol use: No   Review of Systems Review of Systems  Constitutional: Negative for chills, fatigue and fever.  HENT: Negative for congestion, ear pain, sinus pain and sore throat.   Eyes: Negative.   Respiratory: Negative for cough, shortness of breath and wheezing.   Cardiovascular: Negative for chest pain, palpitations and leg swelling.  Gastrointestinal: Negative for abdominal pain, diarrhea, nausea and vomiting.  Genitourinary: Negative for dysuria, frequency and urgency.  Musculoskeletal: Low back pain, improving Skin: Negative for color change, pallor and rash.  Neurological: Negative for syncope, light-headedness and headaches.  Psychiatric/Behavioral: The patient is not nervous/anxious.       Objective:   Physical Exam  Constitutional: She is oriented to person, place, and time. She appears well-developed and well-nourished. No distress.  Eyes: EOM are normal. No scleral icterus.  Cardiovascular: Normal rate and regular rhythm.  Pulmonary/Chest: Effort normal and breath sounds normal. No respiratory distress.  Musculoskeletal:  Mild tenderness with palpation of left low back into the buttock  Neurological: She is alert and oriented to person, place, and time. No cranial nerve deficit.  Gait normal  Skin: Skin is warm and dry. No pallor.  Psychiatric: She has a normal mood and affect. Her behavior is normal.  Nursing note and vitals reviewed.     Vitals:   03/19/18 1313  BP: (!) 142/88  Pulse: (!) 106  Temp: 98.4 F (36.9 C)  SpO2: 95%   BP Readings from Last 3 Encounters:  03/19/18 (!) 142/88  03/12/18 (!) 150/98  03/01/18 136/86    Assessment & Plan:    Back pain -low back pain has improved.  Patient will continue Mobic for next month and also do physical therapy.  Discussed that we try to avoid having patients be on long-term NSAIDs due to risk of cardiovascular kidney issues.  Blood pressure- BP reading is improved when compared to last visit.  We will continue to monitor blood pressure.  Keep appt as scheduled for regular follow up

## 2018-03-24 DIAGNOSIS — M545 Low back pain: Secondary | ICD-10-CM | POA: Diagnosis not present

## 2018-03-29 DIAGNOSIS — M545 Low back pain: Secondary | ICD-10-CM | POA: Diagnosis not present

## 2018-03-31 DIAGNOSIS — M545 Low back pain: Secondary | ICD-10-CM | POA: Diagnosis not present

## 2018-04-07 DIAGNOSIS — M545 Low back pain: Secondary | ICD-10-CM | POA: Diagnosis not present

## 2018-04-08 DIAGNOSIS — M545 Low back pain: Secondary | ICD-10-CM | POA: Diagnosis not present

## 2018-05-09 ENCOUNTER — Other Ambulatory Visit: Payer: Self-pay | Admitting: Family Medicine

## 2018-05-21 ENCOUNTER — Encounter: Payer: Self-pay | Admitting: Internal Medicine

## 2018-05-21 ENCOUNTER — Ambulatory Visit: Payer: 59 | Admitting: Internal Medicine

## 2018-05-21 VITALS — BP 128/78 | HR 91 | Temp 97.9°F | Ht 62.56 in | Wt 183.0 lb

## 2018-05-21 DIAGNOSIS — E559 Vitamin D deficiency, unspecified: Secondary | ICD-10-CM | POA: Diagnosis not present

## 2018-05-21 DIAGNOSIS — R739 Hyperglycemia, unspecified: Secondary | ICD-10-CM | POA: Diagnosis not present

## 2018-05-21 DIAGNOSIS — E669 Obesity, unspecified: Secondary | ICD-10-CM

## 2018-05-21 DIAGNOSIS — Z23 Encounter for immunization: Secondary | ICD-10-CM | POA: Diagnosis not present

## 2018-05-21 DIAGNOSIS — R748 Abnormal levels of other serum enzymes: Secondary | ICD-10-CM | POA: Insufficient documentation

## 2018-05-21 DIAGNOSIS — E785 Hyperlipidemia, unspecified: Secondary | ICD-10-CM

## 2018-05-21 DIAGNOSIS — D179 Benign lipomatous neoplasm, unspecified: Secondary | ICD-10-CM | POA: Insufficient documentation

## 2018-05-21 DIAGNOSIS — Z1159 Encounter for screening for other viral diseases: Secondary | ICD-10-CM

## 2018-05-21 DIAGNOSIS — Z1231 Encounter for screening mammogram for malignant neoplasm of breast: Secondary | ICD-10-CM | POA: Diagnosis not present

## 2018-05-21 DIAGNOSIS — L578 Other skin changes due to chronic exposure to nonionizing radiation: Secondary | ICD-10-CM | POA: Diagnosis not present

## 2018-05-21 DIAGNOSIS — L819 Disorder of pigmentation, unspecified: Secondary | ICD-10-CM

## 2018-05-21 DIAGNOSIS — D1720 Benign lipomatous neoplasm of skin and subcutaneous tissue of unspecified limb: Secondary | ICD-10-CM | POA: Diagnosis not present

## 2018-05-21 DIAGNOSIS — Z1283 Encounter for screening for malignant neoplasm of skin: Secondary | ICD-10-CM | POA: Diagnosis not present

## 2018-05-21 DIAGNOSIS — Z0184 Encounter for antibody response examination: Secondary | ICD-10-CM

## 2018-05-21 DIAGNOSIS — E039 Hypothyroidism, unspecified: Secondary | ICD-10-CM

## 2018-05-21 DIAGNOSIS — Z1389 Encounter for screening for other disorder: Secondary | ICD-10-CM

## 2018-05-21 HISTORY — DX: Abnormal levels of other serum enzymes: R74.8

## 2018-05-21 HISTORY — DX: Benign lipomatous neoplasm, unspecified: D17.9

## 2018-05-21 MED ORDER — ATORVASTATIN CALCIUM 10 MG PO TABS: 10.0000 mg | ORAL_TABLET | Freq: Every day | ORAL | 3 refills | Status: DC

## 2018-05-21 MED ORDER — LEVOTHYROXINE SODIUM 75 MCG PO TABS: ORAL_TABLET | ORAL | 3 refills | Status: DC

## 2018-05-21 NOTE — Patient Instructions (Addendum)
Fatty Liver Fatty liver, also called hepatic steatosis or steatohepatitis, is a condition in which too much fat has built up in your liver cells. The liver removes harmful substances from your bloodstream. It produces fluids your body needs. It also helps your body use and store energy from the food you eat. In many cases, fatty liver does not cause symptoms or problems. It is often diagnosed when tests are being done for other reasons. However, over time, fatty liver can cause inflammation that may lead to more serious liver problems, such as scarring of the liver (cirrhosis). What are the causes? Causes of fatty liver may include:  Drinking too much alcohol.  Poor nutrition.  Obesity.  Cushing syndrome.  Diabetes.  Hyperlipidemia.  Pregnancy.  Certain drugs.  Poisons.  Some viral infections.  What increases the risk? You may be more likely to develop fatty liver if you:  Abuse alcohol.  Are pregnant.  Are overweight.  Have diabetes.  Have hepatitis.  Have a high triglyceride level.  What are the signs or symptoms? Fatty liver often does not cause any symptoms. In cases where symptoms develop, they can include:  Fatigue.  Weakness.  Weight loss.  Confusion.  Abdominal pain.  Yellowing of your skin and the white parts of your eyes (jaundice).  Nausea and vomiting.  How is this diagnosed? Fatty liver may be diagnosed by:  Physical exam and medical history.  Blood tests.  Imaging tests, such as an ultrasound, CT scan, or MRI.  Liver biopsy. A small sample of liver tissue is removed using a needle. The sample is then looked at under a microscope.  How is this treated? Fatty liver is often caused by other health conditions. Treatment for fatty liver may involve medicines and lifestyle changes to manage conditions such as:  Alcoholism.  High cholesterol.  Diabetes.  Being overweight or obese.  Follow these instructions at home:  Eat a  healthy diet as directed by your health care provider.  Exercise regularly. This can help you lose weight and control your cholesterol and diabetes. Talk to your health care provider about an exercise plan and which activities are best for you.  Do not drink alcohol.  Take medicines only as directed by your health care provider. Contact a health care provider if: You have difficulty controlling your:  Blood sugar.  Cholesterol.  Alcohol consumption.  Get help right away if:  You have abdominal pain.  You have jaundice.  You have nausea and vomiting. This information is not intended to replace advice given to you by your health care provider. Make sure you discuss any questions you have with your health care provider. Document Released: 09/05/2005 Document Revised: 12/27/2015 Document Reviewed: 11/30/2013 Elsevier Interactive Patient Education  2018 [employer].  Cholesterol Cholesterol is a white, waxy, fat-like substance that is needed by the human body in small amounts. The liver makes all the cholesterol we need. Cholesterol is carried from the liver by the blood through the blood vessels. Deposits of cholesterol (plaques) may build up on blood vessel (artery) walls. Plaques make the arteries narrower and stiffer. Cholesterol plaques increase the risk for heart attack and stroke. You cannot feel your cholesterol level even if it is very high. The only way to know that it is high is to have a blood test. Once you know your cholesterol levels, you should keep a record of the test results. Work with your health care provider to keep your levels in the desired range.  What do the results mean?  Total cholesterol is a rough measure of all the cholesterol in your blood.  LDL (low-density lipoprotein) is the "bad" cholesterol. This is the type that causes plaque to build up on the artery walls. You want this level to be low.  HDL (high-density lipoprotein) is the "good" cholesterol  because it cleans the arteries and carries the LDL away. You want this level to be high.  Triglycerides are fat that the body can either burn for energy or store. High levels are closely linked to heart disease. What are the desired levels of cholesterol?  Total cholesterol below 200.  LDL below 100 for people who are at risk, below 70 for people at very high risk.  HDL above 40 is good. A level of 60 or higher is considered to be protective against heart disease.  Triglycerides below 150. How can I lower my cholesterol? Diet Follow your diet program as told by your health care provider.  Choose fish or white meat chicken and [place], roasted or baked. Limit fatty cuts of red meat, fried foods, and processed meats, such as sausage and lunch meats.  Eat lots of fresh fruits and vegetables.  Choose whole grains, beans, pasta, potatoes, and cereals.  Choose olive oil, corn oil, or canola oil, and use only small amounts.  Avoid butter, mayonnaise, shortening, or palm kernel oils.  Avoid foods with trans fats.  Drink skim or nonfat milk and eat low-fat or nonfat yogurt and cheeses. Avoid whole milk, cream, ice cream, egg yolks, and full-fat cheeses.  Healthier desserts include angel food cake, ginger snaps, animal crackers, hard candy, popsicles, and low-fat or nonfat frozen yogurt. Avoid pastries, cakes, pies, and cookies.  Exercise  Follow your exercise program as told by your health care provider. A regular program: ? Helps to decrease LDL and raise HDL. ? Helps with weight control.  Do things that increase your activity level, such as gardening, walking, and taking the stairs.  Ask your health care provider about ways that you can be more active in your daily life.  Medicine  Take over-the-counter and prescription medicines only as told by your health care provider. ? Medicine may be prescribed by your health care provider to help lower cholesterol and decrease the risk for  heart disease. This is usually done if diet and exercise have failed to bring down cholesterol levels. ? If you have several risk factors, you may need medicine even if your levels are normal.  This information is not intended to replace advice given to you by your health care provider. Make sure you discuss any questions you have with your health care provider. Document Released: 04/15/2001 Document Revised: 02/16/2016 Document Reviewed: 01/19/2016 Elsevier Interactive Patient Education  2018 [employer].  Fatty Liver Fatty liver, also called hepatic steatosis or steatohepatitis, is a condition in which too much fat has built up in your liver cells. The liver removes harmful substances from your bloodstream. It produces fluids your body needs. It also helps your body use and store energy from the food you eat. In many cases, fatty liver does not cause symptoms or problems. It is often diagnosed when tests are being done for other reasons. However, over time, fatty liver can cause inflammation that may lead to more serious liver problems, such as scarring of the liver (cirrhosis). What are the causes? Causes of fatty liver may include:  Drinking too much alcohol.  Poor nutrition.  Obesity.  Cushing syndrome.  Diabetes.  Hyperlipidemia.  Pregnancy.  Certain drugs.  Poisons.  Some viral infections.  What increases the risk? You may be more likely to develop fatty liver if you:  Abuse alcohol.  Are pregnant.  Are overweight.  Have diabetes.  Have hepatitis.  Have a high triglyceride level.  What are the signs or symptoms? Fatty liver often does not cause any symptoms. In cases where symptoms develop, they can include:  Fatigue.  Weakness.  Weight loss.  Confusion.  Abdominal pain.  Yellowing of your skin and the white parts of your eyes (jaundice).  Nausea and vomiting.  How is this diagnosed? Fatty liver may be diagnosed by:  Physical exam and  medical history.  Blood tests.  Imaging tests, such as an ultrasound, CT scan, or MRI.  Liver biopsy. A small sample of liver tissue is removed using a needle. The sample is then looked at under a microscope.  How is this treated? Fatty liver is often caused by other health conditions. Treatment for fatty liver may involve medicines and lifestyle changes to manage conditions such as:  Alcoholism.  High cholesterol.  Diabetes.  Being overweight or obese.  Follow these instructions at home:  Eat a healthy diet as directed by your health care provider.  Exercise regularly. This can help you lose weight and control your cholesterol and diabetes. Talk to your health care provider about an exercise plan and which activities are best for you.  Do not drink alcohol.  Take medicines only as directed by your health care provider. Contact a health care provider if: You have difficulty controlling your:  Blood sugar.  Cholesterol.  Alcohol consumption.  Get help right away if:  You have abdominal pain.  You have jaundice.  You have nausea and vomiting. This information is not intended to replace advice given to you by your health care provider. Make sure you discuss any questions you have with your health care provider. Document Released: 09/05/2005 Document Revised: 12/27/2015 Document Reviewed: 11/30/2013 Elsevier Interactive Patient Education  2018 [employer].  Shingrix (x2 doses) Recombinant Zoster (Shingles) Vaccine, RZV: What You Need to Know 1. Why get vaccinated? Shingles (also called herpes zoster, or just zoster) is a painful skin rash, often with blisters. Shingles is caused by the varicella zoster virus, the same virus that causes chickenpox. After you have chickenpox, the virus stays in your body and can cause shingles later in life. You can't catch shingles from another person. However, a person who has never had chickenpox (or chickenpox vaccine) could get  chickenpox from someone with shingles. A shingles rash usually appears on one side of the face or body and heals within 2 to 4 weeks. Its main symptom is pain, which can be severe. Other symptoms can include fever, headache, chills and upset stomach. Very rarely, a shingles infection can lead to pneumonia, hearing problems, blindness, brain inflammation (encephalitis), or death. For about 1 person in 5, severe pain can continue even long after the rash has cleared up. This long-lasting pain is called post-herpetic neuralgia (PHN). Shingles is far more common in people 44 years of age and older than in younger people, and the risk increases with age. It is also more common in people whose immune system is weakened because of a disease such as cancer, or by drugs such as steroids or chemotherapy. At least 1 million people a year in the [place] get shingles. 2. Shingles vaccine (recombinant) Recombinant shingles vaccine was approved by FDA in 2017 for the  prevention of shingles. In clinical trials, it was more than 90% effective in preventing shingles. It can also reduce the likelihood of PHN. Two doses, 2 to 6 months apart, are recommended for adults 43 and older. This vaccine is also recommended for people who have already gotten the live shingles vaccine (Zostavax). There is no live virus in this vaccine. 3. Some people should not get this vaccine Tell your vaccine provider if you:  Have any severe, life-threatening allergies. A person who has ever had a life-threatening allergic reaction after a dose of recombinant shingles vaccine, or has a severe allergy to any component of this vaccine, may be advised not to be vaccinated. Ask your health care provider if you want information about vaccine components.  Are pregnant or breastfeeding. There is not much information about use of recombinant shingles vaccine in pregnant or nursing women. Your healthcare provider might recommend delaying  vaccination.  Are not feeling well. If you have a mild illness, such as a cold, you can probably get the vaccine today. If you are moderately or severely ill, you should probably wait until you recover. Your doctor can advise you.  4. Risks of a vaccine reaction With any medicine, including vaccines, there is a chance of reactions. After recombinant shingles vaccination, a person might experience:  Pain, redness, soreness, or swelling at the site of the injection  Headache, muscle aches, fever, shivering, fatigue  In clinical trials, most people got a sore arm with mild or moderate pain after vaccination, and some also had redness and swelling where they got the shot. Some people felt tired, had muscle pain, a headache, shivering, fever, stomach pain, or nausea. About 1 out of 6 people who got recombinant zoster vaccine experienced side effects that prevented them from doing regular activities. Symptoms went away on their own in about 2 to 3 days. Side effects were more common in younger people. You should still get the second dose of recombinant zoster vaccine even if you had one of these reactions after the first dose. Other things that could happen after this vaccine:  People sometimes faint after medical procedures, including vaccination. Sitting or lying down for about 15 minutes can help prevent fainting and injuries caused by a fall. Tell your provider if you feel dizzy or have vision changes or ringing in the ears.  Some people get shoulder pain that can be more severe and longer-lasting than routine soreness that can follow injections. This happens very rarely.  Any medication can cause a severe allergic reaction. Such reactions to a vaccine are estimated at about 1 in a million doses, and would happen within a few minutes to a few hours after the vaccination. As with any medicine, there is a very remote chance of a vaccine causing a serious injury or death. The safety of vaccines is  always being monitored. For more information, visit: http://www.aguilar.org/ 5. What if there is a serious problem? What should I look for?  Look for anything that concerns you, such as signs of a severe allergic reaction, very high fever, or unusual behavior. Signs of a severe allergic reaction can include hives, swelling of the face and throat, difficulty breathing, a fast heartbeat, dizziness, and weakness. These would usually start a few minutes to a few hours after the vaccination. What should I do?  If you think it is a severe allergic reaction or other emergency that can't wait, call 9-1-1 and get to the nearest hospital. Otherwise, call your health care  provider. Afterward, the reaction should be reported to the Vaccine Adverse Event Reporting System (VAERS). Your doctor should file this report, or you can do it yourself through the VAERS web site atwww.vaers.https://www.bray.com/ by calling (413)203-1115. VAERS does not give medical advice. 6. How can I learn more?  Ask your healthcare provider. He or she can give you the vaccine package insert or suggest other sources of information.  Call your local or state health department.  Contact the Centers for Disease Control and Prevention (CDC): ? Call (618)670-3680 (1-800-CDC-INFO) or ? Visit the CDC's website at http://hunter.com/ CDC Vaccine Information Statement (VIS) Recombinant Zoster Vaccine (09/15/2016) This information is not intended to replace advice given to you by your health care provider. Make sure you discuss any questions you have with your health care provider. Document Released: 09/30/2016 Document Revised: 09/30/2016 Document Reviewed: 09/30/2016 Elsevier Interactive Patient Education  [employer].

## 2018-05-21 NOTE — Progress Notes (Signed)
Chief Complaint  Patient presents with  . Establish Care    transfer care/   TOC   1. HLD she can only tolerate lipitor brand as generic Is not effect on lipitor 10 mg qhs  02/22/2018 Cholesterol: 183 HDL Cholesterol: 46.00 LDL (calc): 115 (H) NonHDL: 136.76 Triglycerides: 111.0 VLDL: 22.2  2. Hypothyroidism on levo 75 mcg 02/22/18 TSH 3.36  3. H/o elevated lfts no h/o fatty liver will do [place] to w/u  4. H/o lipomas right ankle swelling and pt thinks it is another lipoma growth and she also has one on her right back that retired Dr. Jamal Collin was watching and measuring. It is not sx'matic for her    Review of Systems  Constitutional: Negative for weight loss.  HENT: Negative for hearing loss.   Eyes: Negative for blurred vision.  Respiratory: Negative for shortness of breath.   Cardiovascular: Negative for chest pain.  Gastrointestinal: Negative for abdominal pain.  Musculoskeletal: Negative for falls.  Skin: Negative for rash.       +lipoma right back and ankle?  Neurological: Negative for headaches.  Psychiatric/Behavioral: Negative for depression.   Past Medical History:  Diagnosis Date  . Anemia    age 62  . Complication of anesthesia   . Diffuse cystic mastopathy   . Hyperlipidemia   . Hypothyroidism   . PONV (postoperative nausea and vomiting)    nausea  . Thyroid disease 2011   Past Surgical History:  Procedure Laterality Date  . ABDOMINAL HYSTERECTOMY     bladder attached  . BLADDER SURGERY    . BREAST SURGERY Right 04/01/10  . COLONOSCOPY  2011  . HEMORRHOID SURGERY N/A 03/12/2017   Procedure: INTERNAL AND EXTERNAL HEMORRHOIDECTOMY;  Surgeon: Christene Lye, MD;  Location: [facility] ORS;  Service: General;  Laterality: N/A;  . HEMORROIDECTOMY     lanced  . lipoma removal  1999   inner knee  . TOE SURGERY     Family History  Problem Relation Age of Onset  . Arthritis Mother   . Hypertension Mother   . Cancer Father        prostate and mouth  .  Hyperlipidemia Father   . Diabetes Father   . Breast cancer Paternal Aunt   . Cancer Paternal Aunt    Social History   Socioeconomic History  . Marital status: Married    Spouse name: Not on file  . Number of children: Not on file  . Years of education: Not on file  . Highest education level: Not on file  Occupational History  . Not on file  Social Needs  . Financial resource strain: Not on file  . Food insecurity:    Worry: Not on file    Inability: Not on file  . Transportation needs:    Medical: Not on file    Non-medical: Not on file  Tobacco Use  . Smoking status: Never Smoker  . Smokeless tobacco: Never Used  Substance and Sexual Activity  . Alcohol use: No  . Drug use: No  . Sexual activity: Not on file  Lifestyle  . Physical activity:    Days per week: Not on file    Minutes per session: Not on file  . Stress: Not on file  Relationships  . Social connections:    Talks on phone: Not on file    Gets together: Not on file    Attends religious service: Not on file    Active member of club or organization:  Not on file    Attends meetings of clubs or organizations: Not on file    Relationship status: Not on file  . Intimate partner violence:    Fear of current or ex partner: Not on file    Emotionally abused: Not on file    Physically abused: Not on file    Forced sexual activity: Not on file  Other Topics Concern  . Not on file  Social History Narrative  . Not on file   Current Meds  Medication Sig  . aspirin 81 MG tablet Take 81 mg by mouth daily with supper.   . cholecalciferol (VITAMIN D) 1000 UNITS tablet Take 1,000 Units by mouth daily with supper.   . levothyroxine (SYNTHROID, LEVOTHROID) 75 MCG tablet TAKE 1 TABLET BY MOUTH ON EMPTY STOMACH 30-60 MIN BEFORE BREAKFAST  . LIPITOR 10 MG tablet TAKE 1 TABLET(10 MG) BY MOUTH DAILY  . meloxicam (MOBIC) 15 MG tablet TK 1 T PO QD  . Multiple Vitamins-Minerals (ONE-A-DAY 50 PLUS) TABS Take 1 tablet by  mouth daily with supper.   . Omega-3 Fatty Acids (FISH OIL) 1200 MG CAPS Take 1,200 mg by mouth daily with supper.   . polyethylene glycol powder (MIRALAX) powder Take 1 Container by mouth once.  . Triamcinolone Acetonide (NASACORT AQ NA) Place 1 spray into the nose at bedtime.   . [DISCONTINUED] cyclobenzaprine (FLEXERIL) 10 MG tablet Take 1 tablet (10 mg total) by mouth 3 (three) times daily as needed for muscle spasms.  . [DISCONTINUED] LIPITOR 10 MG tablet TAKE 1 TABLET(10 MG) BY MOUTH DAILY  . [DISCONTINUED] naproxen sodium (ANAPROX) 220 MG tablet Take 220 mg by mouth daily as needed (pain).  . [DISCONTINUED] predniSONE (DELTASONE) 20 MG tablet Take 2 tablets (40 mg total) by mouth daily with breakfast.   No Known Allergies Recent Results (from the past 2160 hour(s))  T4, free     Status: None   Collection Time: 02/22/18  7:41 AM  Result Value Ref Range   Free T4 0.95 0.60 - 1.60 ng/dL    Comment: Specimens from patients who are undergoing biotin therapy and /or ingesting biotin supplements may contain high levels of biotin.  The higher biotin concentration in these specimens interferes with this Free T4 assay.  Specimens that contain high levels  of biotin may cause false high results for this Free T4 assay.  Please interpret results in light of the total clinical presentation of the patient.    TSH     Status: None   Collection Time: 02/22/18  7:41 AM  Result Value Ref Range   TSH 3.36 0.35 - 4.50 uIU/mL  Lipid Profile     Status: Abnormal   Collection Time: 02/22/18  7:41 AM  Result Value Ref Range   Cholesterol 183 0 - 200 mg/dL    Comment: ATP III Classification       Desirable:  < 200 mg/dL               Borderline High:  200 - 239 mg/dL          High:  > = 240 mg/dL   Triglycerides 111.0 0.0 - 149.0 mg/dL    Comment: Normal:  <150 mg/dLBorderline High:  150 - 199 mg/dL   HDL 46.00 >39.00 mg/dL   VLDL 22.2 0.0 - 40.0 mg/dL   LDL Cholesterol 115 (H) 0 - 99 mg/dL   Total  CHOL/HDL Ratio 4     Comment:  Men          Women1/2 Average Risk     3.4          3.3Average Risk          5.0          4.42X Average Risk          9.6          7.13X Average Risk          15.0          11.0                       NonHDL 136.76     Comment: NOTE:  Non-HDL goal should be 30 mg/dL higher than patient's LDL goal (i.e. LDL goal of < 70 mg/dL, would have non-HDL goal of < 100 mg/dL)  CBC w/Diff     Status: None   Collection Time: 02/22/18  7:41 AM  Result Value Ref Range   WBC 5.8 4.0 - 10.5 K/uL   RBC 4.84 3.87 - 5.11 Mil/uL   Hemoglobin 14.3 12.0 - 15.0 g/dL   HCT 41.3 36.0 - 46.0 %   MCV 85.3 78.0 - 100.0 fl   MCHC 34.5 30.0 - 36.0 g/dL   RDW 13.8 11.5 - 15.5 %   Platelets 249.0 150.0 - 400.0 K/uL   Neutrophils Relative % 64.0 43.0 - 77.0 %   Lymphocytes Relative 26.4 12.0 - 46.0 %   Monocytes Relative 7.5 3.0 - 12.0 %   Eosinophils Relative 1.7 0.0 - 5.0 %   Basophils Relative 0.4 0.0 - 3.0 %   Neutro Abs 3.7 1.4 - 7.7 K/uL   Lymphs Abs 1.5 0.7 - 4.0 K/uL   Monocytes Absolute 0.4 0.1 - 1.0 K/uL   Eosinophils Absolute 0.1 0.0 - 0.7 K/uL   Basophils Absolute 0.0 0.0 - 0.1 K/uL  Comp Met (CMET)     Status: Abnormal   Collection Time: 02/22/18  7:41 AM  Result Value Ref Range   Sodium 140 135 - 145 mEq/L   Potassium 3.7 3.5 - 5.1 mEq/L   Chloride 104 96 - 112 mEq/L   CO2 28 19 - 32 mEq/L   Glucose, Bld 102 (H) 70 - 99 mg/dL   BUN 11 6 - 23 mg/dL   Creatinine, Ser 0.59 0.40 - 1.20 mg/dL   Total Bilirubin 0.6 0.2 - 1.2 mg/dL   Alkaline Phosphatase 91 39 - 117 U/L   AST 31 0 - 37 U/L   ALT 42 (H) 0 - 35 U/L   Total Protein 6.8 6.0 - 8.3 g/dL   Albumin 4.3 3.5 - 5.2 g/dL   Calcium 8.9 8.4 - 10.5 mg/dL   GFR 110.51 >60.00 mL/min  HM MAMMOGRAPHY     Status: None   Collection Time: 03/01/18 12:00 AM  Result Value Ref Range   HM Mammogram 0-4 Bi-Rad 0-4 Bi-Rad, Self Reported Normal    Comment: WNL-RTN in 1 year  Basic Metabolic Panel (BMET)      Status: Abnormal   Collection Time: 03/12/18  4:30 PM  Result Value Ref Range   Glucose, Bld 101 (H) 65 - 99 mg/dL    Comment: .            Fasting reference interval . For someone without known diabetes, a glucose value between 100 and 125 mg/dL is consistent with prediabetes and should be confirmed with a follow-up test. .    BUN  13 7 - 25 mg/dL   Creat 0.62 0.50 - 0.99 mg/dL    Comment: For patients >19 years of age, the reference limit for Creatinine is approximately 13% higher for people identified as African-American. .    BUN/Creatinine Ratio NOT APPLICABLE 6 - 22 (calc)   Sodium 141 135 - 146 mmol/L   Potassium 4.0 3.5 - 5.3 mmol/L   Chloride 103 98 - 110 mmol/L   CO2 30 20 - 32 mmol/L   Calcium 9.7 8.6 - 10.4 mg/dL  POCT rapid strep A     Status: None   Collection Time: 03/12/18  4:31 PM  Result Value Ref Range   Rapid Strep A Screen Negative Negative   Objective  Body mass index is 32.87 kg/m. Wt Readings from Last 3 Encounters:  05/21/18 183 lb (83 kg)  03/19/18 186 lb 9.6 oz (84.6 kg)  03/12/18 188 lb 9.6 oz (85.5 kg)   Temp Readings from Last 3 Encounters:  05/21/18 97.9 F (36.6 C) (Oral)  03/19/18 98.4 F (36.9 C) (Oral)  03/12/18 98 F (36.7 C) (Oral)   BP Readings from Last 3 Encounters:  05/21/18 128/78  03/19/18 (!) 142/88  03/12/18 (!) 150/98   Pulse Readings from Last 3 Encounters:  05/21/18 91  03/19/18 (!) 106  03/12/18 83    Physical Exam  Constitutional: She is oriented to person, place, and time. Vital signs are normal. She appears well-developed and well-nourished. She is cooperative.  HENT:  Head: Normocephalic and atraumatic.  Mouth/Throat: Oropharynx is clear and moist and mucous membranes are normal.  Eyes: Pupils are equal, round, and reactive to light. Conjunctivae are normal.  Cardiovascular: Normal rate, regular rhythm and normal heart sounds.  Pulmonary/Chest: Effort normal and breath sounds normal.  Neurological:  She is alert and oriented to person, place, and time. Gait normal.  Skin: Skin is warm and dry.     Psychiatric: She has a normal mood and affect. Her speech is normal and behavior is normal. Judgment and thought content normal. Cognition and memory are normal.  Nursing note and vitals reviewed.   Assessment   1. HLD  2. Hypothyroidism  3. H/o elevated lfts  4. HM Plan   1. Of note on lipitor 10 mg qhs needs BRAND only to be effective  2. Refilled Levo 75 mcg  3. US abdomen r/o fatty liver  4.  Flu shot given today  Tdap had 03/03/11  Disc shingrix today  Per pt had MMR does not want checked  rec hep B vaccine and if + fatty liver hep A/B vaccines   mammo 03/02/18 neg [place] re-ordered for next year  Pap s/p hysterectomy ovaries still intact not had pap x 5-6 years. Hysterectomy for bladder prolapse and endometriosis  Colonoscopy Dr. Alain Marion 03/13/10 logged in chart though no report due again 03/13/2020  S/p hemorrhoid surgery with Dr. Jamal Collin prolapsed hemorrhoids 2018 per pt DEXA will do age 45  Reviewed labs 02/22/18 CMET, CBC, TSH normal. Check A1C, vitamin D, hep B, UA today  HCV neg 05/01/15  Follows with Dr. Elmer Ramp bruise to toenail and tbse saw 05/21/18 will f/u in 1 year tbse  Provider: Dr. Olivia Mackie McLean-Scocuzza-Internal Medicine

## 2018-05-22 LAB — HEPATITIS B SURFACE ANTIBODY, QUANTITATIVE

## 2018-05-22 LAB — URINALYSIS, ROUTINE W REFLEX MICROSCOPIC
BILIRUBIN URINE: NEGATIVE
GLUCOSE, UA: NEGATIVE
Hgb urine dipstick: NEGATIVE
Ketones, ur: NEGATIVE
LEUKOCYTES UA: NEGATIVE
Nitrite: NEGATIVE
Protein, ur: NEGATIVE
Specific Gravity, Urine: 1.014 (ref 1.001–1.03)
pH: 6.5 (ref 5.0–8.0)

## 2018-05-22 LAB — HEMOGLOBIN A1C
EAG (MMOL/L): 6.3 (calc)
Hgb A1c MFr Bld: 5.6 % of total Hgb (ref <5.7)
Mean Plasma Glucose: 114 (calc)

## 2018-05-22 LAB — VITAMIN D 25 HYDROXY (VIT D DEFICIENCY, FRACTURES): Vit D, 25-Hydroxy: 31 ng/mL (ref 30–100)

## 2018-05-24 ENCOUNTER — Encounter: Payer: Self-pay | Admitting: Internal Medicine

## 2018-06-04 ENCOUNTER — Ambulatory Visit
Admission: RE | Admit: 2018-06-04 | Discharge: 2018-06-04 | Disposition: A | Discharge status: Home / Self Care | Payer: 59 | Source: Ambulatory Visit | Attending: Internal Medicine | Admitting: Internal Medicine

## 2018-06-04 DIAGNOSIS — R748 Abnormal levels of other serum enzymes: Secondary | ICD-10-CM

## 2018-06-14 ENCOUNTER — Telehealth: Payer: Self-pay | Admitting: *Deleted

## 2018-06-14 NOTE — Telephone Encounter (Signed)
Copied from [place] [id number]. Topic: General - Other >> Jun 14, 2018 12:30 PM Marin Olp L wrote: Reason for CRM: Please take a look at Dr. Oleh Genin' template for next year 0761. I scheduled the below CPE as an office visit because the template is only allowing one CPE, but One Note says 2 per session. Please reach out to me if there is something I am missing.  Name: Lindsay, Carney MRN: [id number] Date: 03/11/2019 Status: Sch Time: 9:00 AM Length: 30 Visit Type: OFFICE VISIT [1004] Copay: $35.00 Provider: McLean-Scocuzza, Nino Glow, MD Department: [facility] Referring Provider: Delorise Jackson CSN: [id number] Notes: cpe (last 03/01/2018) (template only allowing one CPE and it should be 2, message sent to flow) Made On: 06/14/2018 12:28 PM By: Valla Leaver

## 2018-09-08 ENCOUNTER — Ambulatory Visit: Payer: 59 | Admitting: Internal Medicine

## 2018-09-08 ENCOUNTER — Encounter: Payer: Self-pay | Admitting: Internal Medicine

## 2018-09-08 VITALS — BP 140/86 | HR 89 | Temp 98.1°F | Ht 62.5 in | Wt 176.4 lb

## 2018-09-08 DIAGNOSIS — I10 Essential (primary) hypertension: Secondary | ICD-10-CM

## 2018-09-08 MED ORDER — AMLODIPINE BESYLATE 2.5 MG PO TABS: 2.5000 mg | ORAL_TABLET | Freq: Every day | ORAL | 3 refills | Status: DC

## 2018-09-08 NOTE — Progress Notes (Signed)
Pre visit review using our clinic review tool, if applicable. No additional management support is needed unless otherwise documented below in the visit note. 

## 2018-09-08 NOTE — Progress Notes (Signed)
Chief Complaint  Patient presents with  . Hypertension   F/u  1. 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 Stress cat dying, mom needs extra care and bedridden dad 61 y.o work stress    Review of Systems  Constitutional: Negative for weight loss.  HENT: Negative for hearing loss.   Eyes: Negative for blurred vision.  Respiratory: Negative for shortness of breath.   Cardiovascular: Negative for chest pain.  Musculoskeletal: Negative for falls.  Skin: Negative for rash.  Neurological: Negative for headaches.  Psychiatric/Behavioral: Negative for depression.   Past Medical History:  Diagnosis Date  . Anemia    age 28  . Complication of anesthesia   . Diffuse cystic mastopathy   . Hiatal hernia   . Hyperlipidemia   . Hypothyroidism   . PONV (postoperative nausea and vomiting)    nausea  . Thyroid disease 2011   Past Surgical History:  Procedure Laterality Date  . ABDOMINAL HYSTERECTOMY     bladder attached  . BLADDER SURGERY    . BREAST SURGERY Right 04/01/10  . COLONOSCOPY  2011  . HEMORRHOID SURGERY N/A 03/12/2017   Procedure: INTERNAL AND EXTERNAL HEMORRHOIDECTOMY;  Surgeon: Christene Lye, MD;  Location: [facility] ORS;  Service: General;  Laterality: N/A;  . HEMORROIDECTOMY     lanced  . lipoma removal  1999   inner knee  . TOE SURGERY     Family History  Problem Relation Age of Onset  . Arthritis Mother   . Hypertension Mother   . Cancer Father        prostate and mouth  . Hyperlipidemia Father   . Diabetes Father   . Breast cancer Paternal Aunt   . Cancer Paternal Aunt    Social History   Socioeconomic History  . Marital status: Married    Spouse name: Not on file  . Number of children: Not on file  . Years of education: Not on file  . Highest education level: Not on file  Occupational History  . Not on file  Social Needs  . Financial resource strain: Not on file  . Food insecurity:    Worry: Not on file    Inability: Not on  file  . Transportation needs:    Medical: Not on file    Non-medical: Not on file  Tobacco Use  . Smoking status: Never Smoker  . Smokeless tobacco: Never Used  Substance and Sexual Activity  . Alcohol use: No  . Drug use: No  . Sexual activity: Not on file  Lifestyle  . Physical activity:    Days per week: Not on file    Minutes per session: Not on file  . Stress: Not on file  Relationships  . Social connections:    Talks on phone: Not on file    Gets together: Not on file    Attends religious service: Not on file    Active member of club or organization: Not on file    Attends meetings of clubs or organizations: Not on file    Relationship status: Not on file  . Intimate partner violence:    Fear of current or ex partner: Not on file    Emotionally abused: Not on file    Physically abused: Not on file    Forced sexual activity: Not on file  Other Topics Concern  . Not on file  Social History Narrative  . Not on file   Current Meds  Medication  Sig  . aspirin 81 MG tablet Take 81 mg by mouth daily with supper.   Marland Kitchen atorvastatin (LIPITOR) 10 MG tablet Take 1 tablet (10 mg total) by mouth daily at 6 PM.  . cholecalciferol (VITAMIN D) 1000 UNITS tablet Take 1,000 Units by mouth daily with supper.   . levothyroxine (SYNTHROID, LEVOTHROID) 75 MCG tablet TAKE 1 TABLET BY MOUTH ON EMPTY STOMACH 30-60 MIN BEFORE BREAKFAST  . meloxicam (MOBIC) 15 MG tablet TK 1 T PO QD  . Multiple Vitamins-Minerals (ONE-A-DAY 50 PLUS) TABS Take 1 tablet by mouth daily with supper.   . Omega-3 Fatty Acids (FISH OIL) 1200 MG CAPS Take 1,200 mg by mouth daily with supper.   . polyethylene glycol powder (MIRALAX) powder Take 1 Container by mouth once.  . Triamcinolone Acetonide (NASACORT AQ NA) Place 1 spray into the nose at bedtime.    No Known Allergies No results found for this or any previous visit (from the past 2160 hour(s)). Objective  Body mass index is 31.75 kg/m. Wt Readings from Last 3  Encounters:  09/08/18 176 lb 6.4 oz (80 kg)  05/21/18 183 lb (83 kg)  03/19/18 186 lb 9.6 oz (84.6 kg)   Temp Readings from Last 3 Encounters:  09/08/18 98.1 F (36.7 C) (Oral)  05/21/18 97.9 F (36.6 C) (Oral)  03/19/18 98.4 F (36.9 C) (Oral)   BP Readings from Last 3 Encounters:  09/08/18 140/86  05/21/18 128/78  03/19/18 (!) 142/88   Pulse Readings from Last 3 Encounters:  09/08/18 89  05/21/18 91  03/19/18 (!) 106    Physical Exam Vitals signs and nursing note reviewed.  Constitutional:      Appearance: Normal appearance. She is well-developed and well-groomed. She is obese.  HENT:     Head: Normocephalic and atraumatic.     Nose: Nose normal.     Mouth/Throat:     Mouth: Mucous membranes are moist.     Pharynx: Oropharynx is clear.  Eyes:     Conjunctiva/sclera: Conjunctivae normal.     Pupils: Pupils are equal, round, and reactive to light.  Cardiovascular:     Rate and Rhythm: Normal rate and regular rhythm.     Heart sounds: Murmur present.     Comments: ? mumur  Pulmonary:     Effort: Pulmonary effort is normal.     Breath sounds: Normal breath sounds.  Skin:    General: Skin is warm and dry.  Neurological:     General: No focal deficit present.     Mental Status: She is alert and oriented to person, place, and time. Mental status is at baseline.     Gait: Gait normal.  Psychiatric:        Attention and Perception: Attention and perception normal.        Mood and Affect: Mood and affect normal.        Speech: Speech normal.        Behavior: Behavior normal. Behavior is cooperative.        Thought Content: Thought content normal.        Cognition and Memory: Cognition and memory normal.        Judgment: Judgment normal.     Assessment   1. HTN  2. Stressors  3. HM Plan   1. Add norvasc 2.5 mg qd  BP log  F/u in 2 weeks  2. Reduce stress  3.  Flu shot utd  Tdap had 03/03/11  Disc shingrix today  Per  pt had MMR does not want checked   rec hep B vaccine and if + fatty liver hep A/B vaccines   mammo 03/02/18 neg [place] re-ordered for next year  Pap s/p hysterectomy ovaries still intact not had pap x 5-6 years. Hysterectomy for bladder prolapse and endometriosis  Colonoscopy Dr. Alain Marion 03/13/10 logged in chart though no report due again 03/13/2020  S/p hemorrhoid surgery with Dr. Jamal Collin prolapsed hemorrhoids 2018 per pt DEXA will do age 84  Reviewed labs 02/22/18 CMET, CBC, TSH normal. Check A1C, vitamin D, hep B, UA today  HCV neg 05/01/15  Follows with Dr. Elmer Ramp bruise to toenail and tbse saw 05/21/18 will f/u in 1 year tbse 05/2019  Provider: Dr. Olivia Mackie McLean-Scocuzza-Internal Medicine

## 2018-09-08 NOTE — Patient Instructions (Signed)
Hypertension Hypertension, commonly called high blood pressure, is when the force of blood pumping through the arteries is too strong. The arteries are the blood vessels that carry blood from the heart throughout the body. Hypertension forces the heart to work harder to pump blood and may cause arteries to become narrow or stiff. Having untreated or uncontrolled hypertension can cause heart attacks, strokes, kidney disease, and other problems. A blood pressure reading consists of a higher number over a lower number. Ideally, your blood pressure should be below 120/80. The first ("top") number is called the systolic pressure. It is a measure of the pressure in your arteries as your heart beats. The second ("bottom") number is called the diastolic pressure. It is a measure of the pressure in your arteries as the heart relaxes. What are the causes? The cause of this condition is not known. What increases the risk? Some risk factors for high blood pressure are under your control. Others are not. Factors you can change  Smoking.  Having type 2 diabetes mellitus, high cholesterol, or both.  Not getting enough exercise or physical activity.  Being overweight.  Having too much fat, sugar, calories, or salt (sodium) in your diet.  Drinking too much alcohol. Factors that are difficult or impossible to change  Having chronic kidney disease.  Having a family history of high blood pressure.  Age. Risk increases with age.  Race. You may be at higher risk if you are African-American.  Gender. Men are at higher risk than women before age 45. After age 65, women are at higher risk than men.  Having obstructive sleep apnea.  Stress. What are the signs or symptoms? Extremely high blood pressure (hypertensive crisis) may cause:  Headache.  Anxiety.  Shortness of breath.  Nosebleed.  Nausea and vomiting.  Severe chest pain.  Jerky movements you cannot control (seizures). How is this  diagnosed? This condition is diagnosed by measuring your blood pressure while you are seated, with your arm resting on a surface. The cuff of the blood pressure monitor will be placed directly against the skin of your upper arm at the level of your heart. It should be measured at least twice using the same arm. Certain conditions can cause a difference in blood pressure between your right and left arms. Certain factors can cause blood pressure readings to be lower or higher than normal (elevated) for a short period of time:  When your blood pressure is higher when you are in a health care provider's office than when you are at home, this is called white coat hypertension. Most people with this condition do not need medicines.  When your blood pressure is higher at home than when you are in a health care provider's office, this is called masked hypertension. Most people with this condition may need medicines to control blood pressure. If you have a high blood pressure reading during one visit or you have normal blood pressure with other risk factors:  You may be asked to return on a different day to have your blood pressure checked again.  You may be asked to monitor your blood pressure at home for 1 week or longer. If you are diagnosed with hypertension, you may have other blood or imaging tests to help your health care provider understand your overall risk for other conditions. How is this treated? This condition is treated by making healthy lifestyle changes, such as eating healthy foods, exercising more, and reducing your alcohol intake. Your health care provider   may prescribe medicine if lifestyle changes are not enough to get your blood pressure under control, and if:  Your systolic blood pressure is above 130.  Your diastolic blood pressure is above 80. Your personal target blood pressure may vary depending on your medical conditions, your age, and other factors. Follow these instructions  at home: Eating and drinking   Eat a diet that is high in fiber and potassium, and low in sodium, added sugar, and fat. An example eating plan is called the DASH (Dietary Approaches to Stop Hypertension) diet. To eat this way: ? Eat plenty of fresh fruits and vegetables. Try to fill half of your plate at each meal with fruits and vegetables. ? Eat whole grains, such as whole wheat pasta, brown rice, or whole grain bread. Fill about one quarter of your plate with whole grains. ? Eat or drink low-fat dairy products, such as skim milk or low-fat yogurt. ? Avoid fatty cuts of meat, processed or cured meats, and poultry with skin. Fill about one quarter of your plate with lean proteins, such as fish, chicken without skin, beans, eggs, and tofu. ? Avoid premade and processed foods. These tend to be higher in sodium, added sugar, and fat.  Reduce your daily sodium intake. Most people with hypertension should eat less than 1,500 mg of sodium a day.  Limit alcohol intake to no more than 1 drink a day for nonpregnant women and 2 drinks a day for men. One drink equals 12 oz of beer, 5 oz of wine, or 1 oz of hard liquor. Lifestyle   Work with your health care provider to maintain a healthy body weight or to lose weight. Ask what an ideal weight is for you.  Get at least 30 minutes of exercise that causes your heart to beat faster (aerobic exercise) most days of the week. Activities may include walking, swimming, or biking.  Include exercise to strengthen your muscles (resistance exercise), such as pilates or lifting weights, as part of your weekly exercise routine. Try to do these types of exercises for 30 minutes at least 3 days a week.  Do not use any products that contain nicotine or tobacco, such as cigarettes and e-cigarettes. If you need help quitting, ask your health care provider.  Monitor your blood pressure at home as told by your health care provider.  Keep all follow-up visits as told by  your health care provider. This is important. Medicines  Take over-the-counter and prescription medicines only as told by your health care provider. Follow directions carefully. Blood pressure medicines must be taken as prescribed.  Do not skip doses of blood pressure medicine. Doing this puts you at risk for problems and can make the medicine less effective.  Ask your health care provider about side effects or reactions to medicines that you should watch for. Contact a health care provider if:  You think you are having a reaction to a medicine you are taking.  You have headaches that keep coming back (recurring).  You feel dizzy.  You have swelling in your ankles.  You have trouble with your vision. Get help right away if:  You develop a severe headache or confusion.  You have unusual weakness or numbness.  You feel faint.  You have severe pain in your chest or abdomen.  You vomit repeatedly.  You have trouble breathing. Summary  Hypertension is when the force of blood pumping through your arteries is too strong. If this condition is not controlled, it   may put you at risk for serious complications.  Your personal target blood pressure may vary depending on your medical conditions, your age, and other factors. For most people, a normal blood pressure is less than 120/80.  Hypertension is treated with lifestyle changes, medicines, or a combination of both. Lifestyle changes include weight loss, eating a healthy, low-sodium diet, exercising more, and limiting alcohol. This information is not intended to replace advice given to you by your health care provider. Make sure you discuss any questions you have with your health care provider. Document Released: 07/21/2005 Document Revised: 06/18/2016 Document Reviewed: 06/18/2016 Elsevier Interactive Patient Education  2019 [place] DASH stands for "Dietary Approaches to Stop Hypertension." The DASH eating  plan is a healthy eating plan that has been shown to reduce high blood pressure (hypertension). It may also reduce your risk for type 2 diabetes, heart disease, and stroke. The DASH eating plan may also help with weight loss. What are tips for following this plan?  General guidelines  Avoid eating more than 2,300 mg (milligrams) of salt (sodium) a day. If you have hypertension, you may need to reduce your sodium intake to 1,500 mg a day.  Limit alcohol intake to no more than 1 drink a day for nonpregnant women and 2 drinks a day for men. One drink equals 12 oz of beer, 5 oz of wine, or 1 oz of hard liquor.  Work with your health care provider to maintain a healthy body weight or to lose weight. Ask what an ideal weight is for you.  Get at least 30 minutes of exercise that causes your heart to beat faster (aerobic exercise) most days of the week. Activities may include walking, swimming, or biking.  Work with your health care provider or diet and nutrition specialist (dietitian) to adjust your eating plan to your individual calorie needs. Reading food labels   Check food labels for the amount of sodium per serving. Choose foods with less than 5 percent of the Daily Value of sodium. Generally, foods with less than 300 mg of sodium per serving fit into this eating plan.  To find whole grains, look for the word "whole" as the first word in the ingredient list. Shopping  Buy products labeled as "low-sodium" or "no salt added."  Buy fresh foods. Avoid canned foods and premade or frozen meals. Cooking  Avoid adding salt when cooking. Use salt-free seasonings or herbs instead of table salt or sea salt. Check with your health care provider or pharmacist before using salt substitutes.  Do not fry foods. Cook foods using healthy methods such as baking, boiling, grilling, and broiling instead.  Cook with heart-healthy oils, such as olive, canola, soybean, or sunflower oil. Meal planning  Eat a  balanced diet that includes: ? 5 or more servings of fruits and vegetables each day. At each meal, try to fill half of your plate with fruits and vegetables. ? Up to 6-8 servings of whole grains each day. ? Less than 6 oz of lean meat, poultry, or fish each day. A 3-oz serving of meat is about the same size as a deck of cards. One egg equals 1 oz. ? 2 servings of low-fat dairy each day. ? A serving of nuts, seeds, or beans 5 times each week. ? Heart-healthy fats. Healthy fats called Omega-3 fatty acids are found in foods such as flaxseeds and coldwater fish, like sardines, salmon, and mackerel.  Limit how much you eat of the  following: ? Canned or prepackaged foods. ? Food that is high in trans fat, such as fried foods. ? Food that is high in saturated fat, such as fatty meat. ? Sweets, desserts, sugary drinks, and other foods with added sugar. ? Full-fat dairy products.  Do not salt foods before eating.  Try to eat at least 2 vegetarian meals each week.  Eat more home-cooked food and less restaurant, buffet, and fast food.  When eating at a restaurant, ask that your food be prepared with less salt or no salt, if possible. What foods are recommended? The items listed may not be a complete list. Talk with your dietitian about what dietary choices are best for you. Grains Whole-grain or whole-wheat bread. Whole-grain or whole-wheat pasta. Brown rice. [place]. Bulgur. Whole-grain and low-sodium cereals. Pita bread. Low-fat, low-sodium crackers. Whole-wheat flour tortillas. Vegetables Fresh or frozen vegetables (raw, steamed, roasted, or grilled). Low-sodium or reduced-sodium tomato and vegetable juice. Low-sodium or reduced-sodium tomato sauce and tomato paste. Low-sodium or reduced-sodium canned vegetables. Fruits All fresh, dried, or frozen fruit. Canned fruit in natural juice (without added sugar). Meat and other protein foods Skinless chicken or [place]. Ground chicken or  [place]. Pork with fat trimmed off. Fish and seafood. Egg whites. Dried beans, peas, or lentils. Unsalted nuts, nut butters, and seeds. Unsalted canned beans. Lean cuts of beef with fat trimmed off. Low-sodium, lean deli meat. Dairy Low-fat (1%) or fat-free (skim) milk. Fat-free, low-fat, or reduced-fat cheeses. Nonfat, low-sodium ricotta or cottage cheese. Low-fat or nonfat yogurt. Low-fat, low-sodium cheese. Fats and oils Soft margarine without trans fats. Vegetable oil. Low-fat, reduced-fat, or light mayonnaise and salad dressings (reduced-sodium). Canola, safflower, olive, soybean, and sunflower oils. Avocado. Seasoning and other foods Herbs. Spices. Seasoning mixes without salt. Unsalted popcorn and pretzels. Fat-free sweets. What foods are not recommended? The items listed may not be a complete list. Talk with your dietitian about what dietary choices are best for you. Grains Baked goods made with fat, such as croissants, muffins, or some breads. Dry pasta or rice meal packs. Vegetables Creamed or fried vegetables. Vegetables in a cheese sauce. Regular canned vegetables (not low-sodium or reduced-sodium). Regular canned tomato sauce and paste (not low-sodium or reduced-sodium). Regular tomato and vegetable juice (not low-sodium or reduced-sodium). Angie Fava. Olives. Fruits Canned fruit in a light or heavy syrup. Fried fruit. Fruit in cream or butter sauce. Meat and other protein foods Fatty cuts of meat. Ribs. Fried meat. Berniece Salines. Sausage. Bologna and other processed lunch meats. Salami. Fatback. Hotdogs. Bratwurst. Salted nuts and seeds. Canned beans with added salt. Canned or smoked fish. Whole eggs or egg yolks. Chicken or [place] with skin. Dairy Whole or 2% milk, cream, and half-and-half. Whole or full-fat cream cheese. Whole-fat or sweetened yogurt. Full-fat cheese. Nondairy creamers. Whipped toppings. Processed cheese and cheese spreads. Fats and oils Butter. Stick margarine. Lard.  Shortening. Ghee. Bacon fat. Tropical oils, such as coconut, palm kernel, or palm oil. Seasoning and other foods Salted popcorn and pretzels. Onion salt, garlic salt, seasoned salt, table salt, and sea salt. Worcestershire sauce. Tartar sauce. Barbecue sauce. Teriyaki sauce. Soy sauce, including reduced-sodium. Steak sauce. Canned and packaged gravies. Fish sauce. Oyster sauce. Cocktail sauce. Horseradish that you find on the shelf. Ketchup. Mustard. Meat flavorings and tenderizers. Bouillon cubes. Hot sauce and Tabasco sauce. Premade or packaged marinades. Premade or packaged taco seasonings. Relishes. Regular salad dressings. Where to find more information:  National Heart, Lung, and [place]: https://wilson-eaton.com/  American Heart Association: www.heart.org  Summary  The DASH eating plan is a healthy eating plan that has been shown to reduce high blood pressure (hypertension). It may also reduce your risk for type 2 diabetes, heart disease, and stroke.  With the DASH eating plan, you should limit salt (sodium) intake to 2,300 mg a day. If you have hypertension, you may need to reduce your sodium intake to 1,500 mg a day.  When on the DASH eating plan, aim to eat more fresh fruits and vegetables, whole grains, lean proteins, low-fat dairy, and heart-healthy fats.  Work with your health care provider or diet and nutrition specialist (dietitian) to adjust your eating plan to your individual calorie needs. This information is not intended to replace advice given to you by your health care provider. Make sure you discuss any questions you have with your health care provider. Document Released: 07/10/2011 Document Revised: 07/14/2016 Document Reviewed: 07/14/2016 Elsevier Interactive Patient Education  2019 [employer]  Amlodipine tablets What is this medicine? AMLODIPINE (am LOE di peen) is a calcium-channel blocker. It affects the amount of calcium found in your heart and muscle cells. This  relaxes your blood vessels, which can reduce the amount of work the heart has to do. This medicine is used to lower high blood pressure. It is also used to prevent chest pain. This medicine may be used for other purposes; ask your health care provider or pharmacist if you have questions. COMMON BRAND NAME(S): Norvasc What should I tell my health care provider before I take this medicine? They need to know if you have any of these conditions: -heart disease -liver disease -an unusual or allergic reaction to amlodipine, other medicines, foods, dyes, or preservatives -pregnant or trying to get pregnant -breast-feeding How should I use this medicine? Take this medicine by mouth with a glass of water. Follow the directions on the prescription label. You can take it with or without food. If it upsets your stomach, take it with food. Take your medicine at regular intervals. Do not take it more often than directed. Do not stop taking except on your doctor's advice. Talk to your pediatrician regarding the use of this medicine in children. While this drug may be prescribed for children as young as 6 years for selected conditions, precautions do apply. Patients over 16 years of age may have a stronger reaction and need a smaller dose. Overdosage: If you think you have taken too much of this medicine contact a poison control center or emergency room at once. NOTE: This medicine is only for you. Do not share this medicine with others. What if I miss a dose? If you miss a dose, take it as soon as you can. If it is almost time for your next dose, take only that dose. Do not take double or extra doses. What may interact with this medicine? Do not take this medicine with any of the following medications: -tranylcypromine This medicine may also interact with the following medications: -clarithromycin -cyclosporine -diltiazem -itraconazole -simvastatin -tacrolimus This list may not describe all possible  interactions. Give your health care provider a list of all the medicines, herbs, non-prescription drugs, or dietary supplements you use. Also tell them if you smoke, drink alcohol, or use illegal drugs. Some items may interact with your medicine. What should I watch for while using this medicine? Visit your healthcare professional for regular checks on your progress. Check your blood pressure as directed. Ask your healthcare professional what your blood pressure should be and when you should  contact him or her. Do not treat yourself for coughs, colds, or pain while you are using this medicine without asking your healthcare professional for advice. Some medicines may increase your blood pressure. You may get dizzy. Do not drive, use machinery, or do anything that needs mental alertness until you know how this medicine affects you. Do not stand or sit up quickly, especially if you are an older patient. This reduces the risk of dizzy or fainting spells. Avoid alcoholic drinks; they can make you dizzier. What side effects may I notice from receiving this medicine? Side effects that you should report to your doctor or health care professional as soon as possible: -allergic reactions like skin rash, itching or hives; swelling of the face, lips, or tongue -fast, irregular heartbeat -signs and symptoms of low blood pressure like dizziness; feeling faint or lightheaded, falls; unusually weak or tired -swelling of ankles, feet, hands Side effects that usually do not require medical attention (report these to your doctor or health care professional if they continue or are bothersome): -dry mouth -facial flushing -headache -stomach pain -tiredness This list may not describe all possible side effects. Call your doctor for medical advice about side effects. You may report side effects to FDA at 1-800-FDA-1088. Where should I keep my medicine? Keep out of the reach of children. Store at room temperature between  59 and 86 degrees F (15 and 30 degrees C). Throw away any unused medicine after the expiration date. NOTE: This sheet is a summary. It may not cover all possible information. If you have questions about this medicine, talk to your doctor, pharmacist, or health care provider.  2019 Elsevier/[employer] Standard (2018-02-12 15:07:10)

## 2018-09-24 ENCOUNTER — Encounter: Payer: Self-pay | Admitting: Internal Medicine

## 2018-09-24 ENCOUNTER — Ambulatory Visit: Payer: 59 | Admitting: Internal Medicine

## 2018-09-24 DIAGNOSIS — I10 Essential (primary) hypertension: Secondary | ICD-10-CM

## 2018-09-24 MED ORDER — AMLODIPINE BESYLATE 2.5 MG PO TABS: 5.0000 mg | ORAL_TABLET | Freq: Every day | ORAL | 3 refills | Status: DC

## 2018-09-24 NOTE — Progress Notes (Signed)
Chief Complaint  Patient presents with  . Follow-up   HTN f/u on norvasc 2.5 mg qd trend 150s to 140s to 130s/80-90s but overall trending down tolerating meds no side effects     Review of Systems  Constitutional: Negative for weight loss.  HENT: Negative for hearing loss.   Eyes: Negative for blurred vision.  Respiratory: Negative for shortness of breath.   Cardiovascular: Negative for chest pain and leg swelling.  Skin: Negative for rash.  Neurological: Negative for headaches.  Psychiatric/Behavioral: Negative for depression.   Past Medical History:  Diagnosis Date  . Anemia    age 63  . Complication of anesthesia   . Diffuse cystic mastopathy   . Hiatal hernia   . Hyperlipidemia   . Hypothyroidism   . PONV (postoperative nausea and vomiting)    nausea  . Thyroid disease 2011   Past Surgical History:  Procedure Laterality Date  . ABDOMINAL HYSTERECTOMY     bladder attached  . BLADDER SURGERY    . BREAST SURGERY Right 04/01/10  . COLONOSCOPY  2011  . HEMORRHOID SURGERY N/A 03/12/2017   Procedure: INTERNAL AND EXTERNAL HEMORRHOIDECTOMY;  Surgeon: Christene Lye, MD;  Location: [facility] ORS;  Service: General;  Laterality: N/A;  . HEMORROIDECTOMY     lanced  . lipoma removal  1999   inner knee  . TOE SURGERY     Family History  Problem Relation Age of Onset  . Arthritis Mother   . Hypertension Mother   . Cancer Father        prostate and mouth  . Hyperlipidemia Father   . Diabetes Father   . Breast cancer Paternal Aunt   . Cancer Paternal Aunt    Social History   Socioeconomic History  . Marital status: Married    Spouse name: Not on file  . Number of children: Not on file  . Years of education: Not on file  . Highest education level: Not on file  Occupational History  . Not on file  Social Needs  . Financial resource strain: Not on file  . Food insecurity:    Worry: Not on file    Inability: Not on file  . Transportation needs:    Medical: Not  on file    Non-medical: Not on file  Tobacco Use  . Smoking status: Never Smoker  . Smokeless tobacco: Never Used  Substance and Sexual Activity  . Alcohol use: No  . Drug use: No  . Sexual activity: Not on file  Lifestyle  . Physical activity:    Days per week: Not on file    Minutes per session: Not on file  . Stress: Not on file  Relationships  . Social connections:    Talks on phone: Not on file    Gets together: Not on file    Attends religious service: Not on file    Active member of club or organization: Not on file    Attends meetings of clubs or organizations: Not on file    Relationship status: Not on file  . Intimate partner violence:    Fear of current or ex partner: Not on file    Emotionally abused: Not on file    Physically abused: Not on file    Forced sexual activity: Not on file  Other Topics Concern  . Not on file  Social History Narrative  . Not on file   Current Meds  Medication Sig  . amLODipine (NORVASC) 2.5 MG tablet  Take 1 tablet (2.5 mg total) by mouth daily.  Marland Kitchen aspirin 81 MG tablet Take 81 mg by mouth daily with supper.   Marland Kitchen atorvastatin (LIPITOR) 10 MG tablet Take 1 tablet (10 mg total) by mouth daily at 6 PM.  . cholecalciferol (VITAMIN D) 1000 UNITS tablet Take 1,000 Units by mouth daily with supper.   . levothyroxine (SYNTHROID, LEVOTHROID) 75 MCG tablet TAKE 1 TABLET BY MOUTH ON EMPTY STOMACH 30-60 MIN BEFORE BREAKFAST  . meloxicam (MOBIC) 15 MG tablet TK 1 T PO QD  . Multiple Vitamins-Minerals (ONE-A-DAY 50 PLUS) TABS Take 1 tablet by mouth daily with supper.   . Omega-3 Fatty Acids (FISH OIL) 1200 MG CAPS Take 1,200 mg by mouth daily with supper.   . polyethylene glycol powder (MIRALAX) powder Take 1 Container by mouth once.  . Triamcinolone Acetonide (NASACORT AQ NA) Place 1 spray into the nose at bedtime.    No Known Allergies No results found for this or any previous visit (from the past 2160 hour(s)). Objective  Body mass index is  31.53 kg/m. Wt Readings from Last 3 Encounters:  09/24/18 175 lb 3.2 oz (79.5 kg)  09/08/18 176 lb 6.4 oz (80 kg)  05/21/18 183 lb (83 kg)   Temp Readings from Last 3 Encounters:  09/24/18 98.2 F (36.8 C) (Oral)  09/08/18 98.1 F (36.7 C) (Oral)  05/21/18 97.9 F (36.6 C) (Oral)   BP Readings from Last 3 Encounters:  09/24/18 136/88  09/08/18 140/86  05/21/18 128/78   Pulse Readings from Last 3 Encounters:  09/24/18 82  09/08/18 89  05/21/18 91    Physical Exam Vitals signs and nursing note reviewed.  Constitutional:      Appearance: Normal appearance. She is well-developed and well-groomed.  HENT:     Head: Normocephalic and atraumatic.     Nose: Nose normal.     Mouth/Throat:     Mouth: Mucous membranes are moist.     Pharynx: Oropharynx is clear.  Eyes:     Conjunctiva/sclera: Conjunctivae normal.     Pupils: Pupils are equal, round, and reactive to light.  Cardiovascular:     Rate and Rhythm: Normal rate and regular rhythm.     Heart sounds: Normal heart sounds. No murmur.  Pulmonary:     Effort: Pulmonary effort is normal.     Breath sounds: Normal breath sounds.  Skin:    General: Skin is warm and dry.  Neurological:     General: No focal deficit present.     Mental Status: She is alert and oriented to person, place, and time. Mental status is at baseline.     Gait: Gait normal.  Psychiatric:        Attention and Perception: Attention and perception normal.        Mood and Affect: Mood and affect normal.        Speech: Speech normal.        Behavior: Behavior normal. Behavior is cooperative.        Thought Content: Thought content normal.        Cognition and Memory: Cognition and memory normal.        Judgment: Judgment normal.     Assessment   1. HTN improving still not at goal  2. HM Plan   1. Increase norvasc to 5 mg qam  Next visit send 5 mg to [facility] at f/u and given letter for work for BP improvement 2.  Flu shot utd  Tdap had  03/03/11   Disc shingrix today  Per pt had MMR does not want checked  rec hep B vaccine and if + fatty liver hep A/B vaccines   mammo 03/02/18 neg [place] re-ordered for next year  Pap s/p hysterectomy ovaries still intact not had pap x 5-6 years. Hysterectomy for bladder prolapse and endometriosis  Colonoscopy Dr. Alain Marion 03/13/10 logged in chart though no report due again 03/13/2020  S/p hemorrhoid surgery with Dr. Jamal Collin prolapsed hemorrhoids 2018 per pt DEXA will do age 65  Reviewed labs 02/22/18 CMET, CBC, TSH normal. Check A1C, vitamin D, hep B, UA today  HCV neg 05/01/15  Follows with Dr. Elmer Ramp bruise to toenail and tbse saw 05/21/18 will f/u in 1 year tbse10/2020  Provider: Dr. Olivia Mackie McLean-Scocuzza-Internal Medicine

## 2018-09-24 NOTE — Patient Instructions (Signed)

## 2018-09-24 NOTE — Progress Notes (Signed)
Pre visit review using our clinic review tool, if applicable. No additional management support is needed unless otherwise documented below in the visit note. 

## 2018-10-08 ENCOUNTER — Encounter: Payer: Self-pay | Admitting: Internal Medicine

## 2018-10-08 ENCOUNTER — Ambulatory Visit (INDEPENDENT_AMBULATORY_CARE_PROVIDER_SITE_OTHER): Payer: BLUE CROSS/BLUE SHIELD | Admitting: Internal Medicine

## 2018-10-08 VITALS — BP 130/78 | HR 76 | Temp 97.8°F | Ht 62.5 in | Wt 174.0 lb

## 2018-10-08 DIAGNOSIS — Z1389 Encounter for screening for other disorder: Secondary | ICD-10-CM

## 2018-10-08 DIAGNOSIS — E785 Hyperlipidemia, unspecified: Secondary | ICD-10-CM

## 2018-10-08 DIAGNOSIS — R748 Abnormal levels of other serum enzymes: Secondary | ICD-10-CM

## 2018-10-08 DIAGNOSIS — E038 Other specified hypothyroidism: Secondary | ICD-10-CM | POA: Diagnosis not present

## 2018-10-08 DIAGNOSIS — I10 Essential (primary) hypertension: Secondary | ICD-10-CM

## 2018-10-08 DIAGNOSIS — Z1329 Encounter for screening for other suspected endocrine disorder: Secondary | ICD-10-CM

## 2018-10-08 MED ORDER — AMLODIPINE BESYLATE 5 MG PO TABS: 5.0000 mg | ORAL_TABLET | Freq: Every day | ORAL | 3 refills | Status: DC

## 2018-10-08 NOTE — Progress Notes (Addendum)
Chief Complaint  Patient presents with  . Hypertension   F/u HTN  1. HTN BP 132/70 repeat 130/78 on norvasc 5 mg qd BP improved from 09/2018 when was 150/90 She has started working out and feeling well    Review of Systems  Constitutional: Negative for weight loss.  HENT: Negative for hearing loss.   Eyes: Negative for blurred vision.  Respiratory: Negative for shortness of breath.   Cardiovascular: Negative for chest pain.  Gastrointestinal: Negative for abdominal pain.  Musculoskeletal: Negative for falls.  Skin: Negative for rash.  Neurological: Negative for headaches.  Psychiatric/Behavioral: Negative for depression.   Past Medical History:  Diagnosis Date  . Anemia    age 63  . Complication of anesthesia   . Diffuse cystic mastopathy   . Hiatal hernia   . Hyperlipidemia   . Hypertension   . Hypothyroidism   . PONV (postoperative nausea and vomiting)    nausea  . Thyroid disease 2011   Past Surgical History:  Procedure Laterality Date  . ABDOMINAL HYSTERECTOMY     bladder attached  . BLADDER SURGERY    . BREAST SURGERY Right 04/01/10  . COLONOSCOPY  2011  . HEMORRHOID SURGERY N/A 03/12/2017   Procedure: INTERNAL AND EXTERNAL HEMORRHOIDECTOMY;  Surgeon: Christene Lye, MD;  Location: [facility] OR[facility];  Service: General;  Laterality: N/A;  . HEMORROIDECTOMY     lanced  . lipoma removal  1999   inner knee  . TOE SURGERY     Family History  Problem Relation Age of Onset  . Arthritis Mother   . Hypertension Mother   . Cancer Father        prostate and mouth  . Hyperlipidemia Father   . Diabetes Father   . Breast cancer Paternal Aunt   . Cancer Paternal Aunt    Social History   Socioeconomic History  . Marital status: Married    Spouse name: Not on file  . Number of children: Not on file  . Years of education: Not on file  . Highest education level: Not on file  Occupational History  . Not on file  Social Needs  . Financial resource strain: Not on  file  . Food insecurity:    Worry: Not on file    Inability: Not on file  . Transportation needs:    Medical: Not on file    Non-medical: Not on file  Tobacco Use  . Smoking status: Never Smoker  . Smokeless tobacco: Never Used  Substance and Sexual Activity  . Alcohol use: No  . Drug use: No  . Sexual activity: Not on file  Lifestyle  . Physical activity:    Days per week: Not on file    Minutes per session: Not on file  . Stress: Not on file  Relationships  . Social connections:    Talks on phone: Not on file    Gets together: Not on file    Attends religious service: Not on file    Active member of club or organization: Not on file    Attends meetings of clubs or organizations: Not on file    Relationship status: Not on file  . Intimate partner violence:    Fear of current or ex partner: Not on file    Emotionally abused: Not on file    Physically abused: Not on file    Forced sexual activity: Not on file  Other Topics Concern  . Not on file  Social History Narrative  Married    No outpatient medications have been marked as taking for the 10/08/18 encounter (Office Visit) with McLean-Scocuzza, Nino Glow, MD.   No Known Allergies No results found for this or any previous visit (from the past 2160 hour(s)). Objective  Body mass index is 31.32 kg/m. Wt Readings from Last 3 Encounters:  10/08/18 174 lb (78.9 kg)  09/24/18 175 lb 3.2 oz (79.5 kg)  09/08/18 176 lb 6.4 oz (80 kg)   Temp Readings from Last 3 Encounters:  10/08/18 97.8 F (36.6 C)  09/24/18 98.2 F (36.8 C) (Oral)  09/08/18 98.1 F (36.7 C) (Oral)   BP Readings from Last 3 Encounters:  10/08/18 130/78  09/24/18 136/88  09/08/18 140/86   Pulse Readings from Last 3 Encounters:  10/08/18 76  09/24/18 82  09/08/18 89    Physical Exam Vitals signs and nursing note reviewed.  Constitutional:      Appearance: Normal appearance.  Neurological:     Mental Status: She is alert.     Assessment    1. HTN 2. HM Plan   1. Rx Norvasc 5 mg qd doing well BP 1teens to 120s/70s-80s at goal  Given note for work  2. Flu shotutd Tdap had 03/03/11  Disc shingrix today  Per pt had MMR does not want checked  rec hep B vaccine  mammo 03/02/18 neg [place] re-ordered for next year  Pap s/p hysterectomy ovaries still intact not had pap x 5-6 years. Hysterectomy for bladder prolapse and endometriosis  Colonoscopy Dr. Alain Marion 03/13/10 logged in chart though no report due again 03/13/2020  S/p hemorrhoid surgery with Dr. Jamal Collin prolapsed hemorrhoids 2018 per pt DEXA will do age 48  HCV neg 05/01/15  Follows with Dr. Elmer Ramp bruise to toenail and tbse saw 05/21/18 will f/u in 1 year tbse10/2020 sch fasting labs    [facility] f/u 03/22/19 Dr. Sabra Heck closed fx left wrist and fx tibial plateau out of work until f/u 04/14/19   05/13/19 [facility] PF stress syndrome right and left knee rec PT 2x per week  Closed fx of tibial plauteau right upper right light duty sitting only no driving 4 hrs per day x 2 weeks then 8 hrs per day x 2 weeks no driving until f/u in 4 weeks   Left wrist fracture hand   [facility] f/u 06/09/2019 Dr. Sabra Heck patellofemoral stress syndrome right and left knee. Closed fx of tibial plateau right, closed fx left wrist ocnt PT resume regular duty at work 06/13/2019 continue PT f/u in 8 weeks and do Xray right knee tunnel view   [facility] 08/04/19 Dr. Migdalia Dk 4.5 months s/p right knee lateral tibial plateau repair left wrist fx doing well mild pain right knee and rom improving rec home exercises and use brace walking on uneven ground rtc in 2 months xray   [facility] 09/30/19 fu in 3 months increase activity as tolerated Xray right knee with tunnel view Dr. Earnestine Leys  Provider: Dr. Olivia Mackie McLean-Scocuzza-[facility]

## 2018-10-08 NOTE — Patient Instructions (Signed)
shingrix vaccine  X 2 doses   Recombinant Zoster (Shingles) Vaccine, RZV: What You Need to Know 1. Why get vaccinated? Shingles (also called herpes zoster, or just zoster) is a painful skin rash, often with blisters. Shingles is caused by the varicella zoster virus, the same virus that causes chickenpox. After you have chickenpox, the virus stays in your body and can cause shingles later in life. You can't catch shingles from another person. However, a person who has never had chickenpox (or chickenpox vaccine) could get chickenpox from someone with shingles. A shingles rash usually appears on one side of the face or body and heals within 2 to 4 weeks. Its main symptom is pain, which can be severe. Other symptoms can include fever, headache, chills, and upset stomach. Very rarely, a shingles infection can lead to pneumonia, hearing problems, blindness, brain inflammation (encephalitis), or death. For about 1 person in 5, severe pain can continue even long after the rash has cleared up. This long-lasting pain is called post-herpetic neuralgia (PHN). Shingles is far more common in people 61 years of age and older than in younger people, and the risk increases with age. It is also more common in people whose immune system is weakened because of a disease such as cancer, or by drugs such as steroids or chemotherapy. At least 1 million people a year in the [place] get shingles. 2. Shingles vaccine (recombinant) Recombinant shingles vaccine was approved by FDA in 2017 for the prevention of shingles. In clinical trials, it was more than 90% effective in preventing shingles. It can also reduce the likelihood of PHN. Two doses, 2 to 6 months apart, are recommended for adults 45 and older. This vaccine is also recommended for people who have already gotten the live shingles vaccine (Zostavax). There is no live virus in this vaccine. 3. Some people should not get this vaccine Tell your vaccine provider if  you:  Have any severe, life-threatening allergies. A person who has ever had a life-threatening allergic reaction after a dose of recombinant shingles vaccine, or has a severe allergy to any component of this vaccine, may be advised not to be vaccinated. Ask your health care provider if you want information about vaccine components.  Are pregnant or breastfeeding. There is not much information about use of recombinant shingles vaccine in pregnant or nursing women. Your healthcare provider might recommend delaying vaccination.  Are not feeling well. If you have a mild illness, such as a cold, you can probably get the vaccine today. If you are moderately or severely ill, you should probably wait until you recover. Your doctor can advise you. 4. Risks of a vaccine reaction With any medicine, including vaccines, there is a chance of reactions. After recombinant shingles vaccination, a person might experience:  Pain, redness, soreness, or swelling at the site of the injection  Headache, muscle aches, fever, shivering, fatigue In clinical trials, most people got a sore arm with mild or moderate pain after vaccination, and some also had redness and swelling where they got the shot. Some people felt tired, had muscle pain, a headache, shivering, fever, stomach pain, or nausea. About 1 out of 6 people who got recombinant zoster vaccine experienced side effects that prevented them from doing regular activities. Symptoms went away on their own in about 2 to 3 days. Side effects were more common in younger people. You should still get the second dose of recombinant zoster vaccine even if you had one of these reactions after  the first dose. Other things that could happen after this vaccine:  People sometimes faint after medical procedures, including vaccination. Sitting or lying down for about 15 minutes can help prevent fainting and injuries caused by a fall. Tell your provider if you feel dizzy or have vision  changes or ringing in the ears.  Some people get shoulder pain that can be more severe and longer-lasting than routine soreness that can follow injections. This happens very rarely.  Any medication can cause a severe allergic reaction. Such reactions to a vaccine are estimated at about 1 in a million doses, and would happen within a few minutes to a few hours after the vaccination. As with any medicine, there is a very remote chance of a vaccine causing a serious injury or death. The safety of vaccines is always being monitored. For more information, visit: http://www.aguilar.org/ 5. What if there is a serious problem? What should I look for?  Look for anything that concerns you, such as signs of a severe allergic reaction, very high fever, or unusual behavior. Signs of a severe allergic reaction can include hives, swelling of the face and throat, difficulty breathing, a fast heartbeat, dizziness, and weakness. These would usually start a few minutes to a few hours after the vaccination. What should I do?  If you think it is a severe allergic reaction or other emergency that can't wait, call 9-1-1 or get to the nearest hospital. Otherwise, call your health care provider. Afterward, the reaction should be reported to the Vaccine Adverse Event Reporting System (VAERS). Your doctor should file this report, or you can do it yourself through the VAERS website at www.vaers.SamedayNews.es, or by calling (417)256-3533. VAERS does not give medical advice. 6. How can I learn more?  Ask your health care provider. He or she can give you the vaccine package insert or suggest other sources of information.  Call your local or state health department.  Contact the Centers for Disease Control and Prevention (CDC): ? Call (229) 242-3752 (1-800-CDC-INFO) or ? Visit CDC's vaccines website at http://hunter.com/ CDC Vaccine Information Statement Recombinant Zoster Vaccine (09/15/2016) This information is not  intended to replace advice given to you by your health care provider. Make sure you discuss any questions you have with your health care provider. Document Released: 09/30/2016 Document Revised: 02/24/2018 Document Reviewed: 02/24/2018 Elsevier Interactive Patient Education  2019 [employer].

## 2018-11-26 ENCOUNTER — Ambulatory Visit: Payer: 59 | Admitting: Internal Medicine

## 2019-03-04 ENCOUNTER — Ambulatory Visit
Admission: RE | Admit: 2019-03-04 | Discharge: 2019-03-04 | Disposition: A | Discharge status: Home / Self Care | Payer: BC Managed Care – PPO | Source: Ambulatory Visit | Attending: Internal Medicine | Admitting: Internal Medicine

## 2019-03-04 ENCOUNTER — Other Ambulatory Visit (INDEPENDENT_AMBULATORY_CARE_PROVIDER_SITE_OTHER): Payer: BC Managed Care – PPO

## 2019-03-04 ENCOUNTER — Other Ambulatory Visit: Payer: Self-pay

## 2019-03-04 DIAGNOSIS — Z1329 Encounter for screening for other suspected endocrine disorder: Secondary | ICD-10-CM | POA: Diagnosis not present

## 2019-03-04 DIAGNOSIS — I10 Essential (primary) hypertension: Secondary | ICD-10-CM

## 2019-03-04 DIAGNOSIS — E785 Hyperlipidemia, unspecified: Secondary | ICD-10-CM | POA: Diagnosis not present

## 2019-03-04 DIAGNOSIS — R748 Abnormal levels of other serum enzymes: Secondary | ICD-10-CM

## 2019-03-04 DIAGNOSIS — Z1389 Encounter for screening for other disorder: Secondary | ICD-10-CM

## 2019-03-04 DIAGNOSIS — Z1231 Encounter for screening mammogram for malignant neoplasm of breast: Secondary | ICD-10-CM | POA: Insufficient documentation

## 2019-03-04 LAB — COMPREHENSIVE METABOLIC PANEL
ALT: 14 U/L (ref 0–35)
AST: 14 U/L (ref 0–37)
Albumin: 4.6 g/dL (ref 3.5–5.2)
Alkaline Phosphatase: 92 U/L (ref 39–117)
BUN: 12 mg/dL (ref 6–23)
CO2: 30 mEq/L (ref 19–32)
Calcium: 9.6 mg/dL (ref 8.4–10.5)
Chloride: 104 mEq/L (ref 96–112)
Creatinine, Ser: 0.62 mg/dL (ref 0.40–1.20)
GFR: 97.86 mL/min (ref >60.00)
Glucose, Bld: 95 mg/dL (ref 70–99)
Potassium: 3.8 mEq/L (ref 3.5–5.1)
Sodium: 141 mEq/L (ref 135–145)
Total Bilirubin: 0.7 mg/dL (ref 0.2–1.2)
Total Protein: 6.8 g/dL (ref 6.0–8.3)

## 2019-03-04 LAB — CBC WITH DIFFERENTIAL/PLATELET
Basophils Absolute: 0 10*3/uL (ref 0.0–0.1)
Basophils Relative: 0.4 % (ref 0.0–3.0)
Eosinophils Absolute: 0.1 10*3/uL (ref 0.0–0.7)
Eosinophils Relative: 1.6 % (ref 0.0–5.0)
HCT: 42.4 % (ref 36.0–46.0)
Hemoglobin: 14.3 g/dL (ref 12.0–15.0)
Lymphocytes Relative: 27.1 % (ref 12.0–46.0)
Lymphs Abs: 1.5 10*3/uL (ref 0.7–4.0)
MCHC: 33.8 g/dL (ref 30.0–36.0)
MCV: 88.6 fl (ref 78.0–100.0)
Monocytes Absolute: 0.4 10*3/uL (ref 0.1–1.0)
Monocytes Relative: 7.1 % (ref 3.0–12.0)
Neutro Abs: 3.5 10*3/uL (ref 1.4–7.7)
Neutrophils Relative %: 63.8 % (ref 43.0–77.0)
Platelets: 266 10*3/uL (ref 150.0–400.0)
RBC: 4.79 Mil/uL (ref 3.87–5.11)
RDW: 13.7 % (ref 11.5–15.5)
WBC: 5.6 10*3/uL (ref 4.0–10.5)

## 2019-03-04 LAB — TSH: TSH: 2.6 u[IU]/mL (ref 0.35–4.50)

## 2019-03-04 LAB — LIPID PANEL
Cholesterol: 178 mg/dL (ref 0–200)
HDL: 47.9 mg/dL (ref >39.00)
LDL Cholesterol: 108 mg/dL — ABNORMAL HIGH (ref 0–99)
NonHDL: 130.54
Total CHOL/HDL Ratio: 4
Triglycerides: 114 mg/dL (ref 0.0–149.0)
VLDL: 22.8 mg/dL (ref 0.0–40.0)

## 2019-03-04 LAB — T4, FREE: Free T4: 1.14 ng/dL (ref 0.60–1.60)

## 2019-03-05 LAB — URINALYSIS, ROUTINE W REFLEX MICROSCOPIC
Bilirubin Urine: NEGATIVE
Glucose, UA: NEGATIVE
Hgb urine dipstick: NEGATIVE
Ketones, ur: NEGATIVE
Leukocytes,Ua: NEGATIVE
Nitrite: NEGATIVE
Protein, ur: NEGATIVE
Specific Gravity, Urine: 1.004 (ref 1.001–1.03)
pH: 7.5 (ref 5.0–8.0)

## 2019-03-07 ENCOUNTER — Inpatient Hospital Stay
Admission: EM | Admit: 2019-03-07 | Discharge: 2019-03-11 | DRG: 493 | Disposition: A | Discharge status: Home Health | Payer: BC Managed Care – PPO | Attending: Internal Medicine | Admitting: Internal Medicine

## 2019-03-07 ENCOUNTER — Emergency Department: Payer: BC Managed Care – PPO

## 2019-03-07 ENCOUNTER — Encounter: Payer: Self-pay | Admitting: Emergency Medicine

## 2019-03-07 ENCOUNTER — Other Ambulatory Visit: Payer: Self-pay

## 2019-03-07 DIAGNOSIS — K449 Diaphragmatic hernia without obstruction or gangrene: Secondary | ICD-10-CM | POA: Diagnosis not present

## 2019-03-07 DIAGNOSIS — Y92019 Unspecified place in single-family (private) house as the place of occurrence of the external cause: Secondary | ICD-10-CM

## 2019-03-07 DIAGNOSIS — I10 Essential (primary) hypertension: Secondary | ICD-10-CM | POA: Diagnosis not present

## 2019-03-07 DIAGNOSIS — R5381 Other malaise: Secondary | ICD-10-CM | POA: Diagnosis not present

## 2019-03-07 DIAGNOSIS — S52572A Other intraarticular fracture of lower end of left radius, initial encounter for closed fracture: Secondary | ICD-10-CM | POA: Diagnosis not present

## 2019-03-07 DIAGNOSIS — Z7401 Bed confinement status: Secondary | ICD-10-CM | POA: Diagnosis not present

## 2019-03-07 DIAGNOSIS — W010XXA Fall on same level from slipping, tripping and stumbling without subsequent striking against object, initial encounter: Secondary | ICD-10-CM | POA: Diagnosis present

## 2019-03-07 DIAGNOSIS — Z7951 Long term (current) use of inhaled steroids: Secondary | ICD-10-CM | POA: Diagnosis not present

## 2019-03-07 DIAGNOSIS — Z79890 Hormone replacement therapy (postmenopausal): Secondary | ICD-10-CM

## 2019-03-07 DIAGNOSIS — Z8349 Family history of other endocrine, nutritional and metabolic diseases: Secondary | ICD-10-CM | POA: Diagnosis not present

## 2019-03-07 DIAGNOSIS — S82144A Nondisplaced bicondylar fracture of right tibia, initial encounter for closed fracture: Secondary | ICD-10-CM | POA: Diagnosis not present

## 2019-03-07 DIAGNOSIS — R52 Pain, unspecified: Secondary | ICD-10-CM | POA: Diagnosis not present

## 2019-03-07 DIAGNOSIS — S82121D Displaced fracture of lateral condyle of right tibia, subsequent encounter for closed fracture with routine healing: Secondary | ICD-10-CM | POA: Diagnosis not present

## 2019-03-07 DIAGNOSIS — S82121A Displaced fracture of lateral condyle of right tibia, initial encounter for closed fracture: Secondary | ICD-10-CM

## 2019-03-07 DIAGNOSIS — S82141A Displaced bicondylar fracture of right tibia, initial encounter for closed fracture: Secondary | ICD-10-CM | POA: Diagnosis not present

## 2019-03-07 DIAGNOSIS — M2550 Pain in unspecified joint: Secondary | ICD-10-CM | POA: Diagnosis not present

## 2019-03-07 DIAGNOSIS — Z833 Family history of diabetes mellitus: Secondary | ICD-10-CM | POA: Diagnosis not present

## 2019-03-07 DIAGNOSIS — Z419 Encounter for procedure for purposes other than remedying health state, unspecified: Secondary | ICD-10-CM

## 2019-03-07 DIAGNOSIS — S82201A Unspecified fracture of shaft of right tibia, initial encounter for closed fracture: Secondary | ICD-10-CM | POA: Diagnosis present

## 2019-03-07 DIAGNOSIS — Z20828 Contact with and (suspected) exposure to other viral communicable diseases: Secondary | ICD-10-CM | POA: Diagnosis present

## 2019-03-07 DIAGNOSIS — E039 Hypothyroidism, unspecified: Secondary | ICD-10-CM | POA: Diagnosis not present

## 2019-03-07 DIAGNOSIS — E785 Hyperlipidemia, unspecified: Secondary | ICD-10-CM | POA: Diagnosis present

## 2019-03-07 DIAGNOSIS — S82131A Displaced fracture of medial condyle of right tibia, initial encounter for closed fracture: Secondary | ICD-10-CM

## 2019-03-07 DIAGNOSIS — Z7982 Long term (current) use of aspirin: Secondary | ICD-10-CM

## 2019-03-07 DIAGNOSIS — R609 Edema, unspecified: Secondary | ICD-10-CM | POA: Diagnosis not present

## 2019-03-07 DIAGNOSIS — W19XXXA Unspecified fall, initial encounter: Secondary | ICD-10-CM | POA: Diagnosis not present

## 2019-03-07 DIAGNOSIS — S82141D Displaced bicondylar fracture of right tibia, subsequent encounter for closed fracture with routine healing: Secondary | ICD-10-CM | POA: Diagnosis not present

## 2019-03-07 DIAGNOSIS — S52502A Unspecified fracture of the lower end of left radius, initial encounter for closed fracture: Secondary | ICD-10-CM | POA: Diagnosis not present

## 2019-03-07 DIAGNOSIS — S62102A Fracture of unspecified carpal bone, left wrist, initial encounter for closed fracture: Secondary | ICD-10-CM

## 2019-03-07 DIAGNOSIS — Z803 Family history of malignant neoplasm of breast: Secondary | ICD-10-CM | POA: Diagnosis not present

## 2019-03-07 DIAGNOSIS — Z8249 Family history of ischemic heart disease and other diseases of the circulatory system: Secondary | ICD-10-CM | POA: Diagnosis not present

## 2019-03-07 DIAGNOSIS — Z8261 Family history of arthritis: Secondary | ICD-10-CM

## 2019-03-07 DIAGNOSIS — Z791 Long term (current) use of non-steroidal anti-inflammatories (NSAID): Secondary | ICD-10-CM

## 2019-03-07 DIAGNOSIS — S52322A Displaced transverse fracture of shaft of left radius, initial encounter for closed fracture: Secondary | ICD-10-CM | POA: Diagnosis not present

## 2019-03-07 DIAGNOSIS — S82101D Unspecified fracture of upper end of right tibia, subsequent encounter for closed fracture with routine healing: Secondary | ICD-10-CM | POA: Diagnosis not present

## 2019-03-07 HISTORY — DX: Unspecified fracture of shaft of right tibia, initial encounter for closed fracture: S82.201A

## 2019-03-07 LAB — CBC WITH DIFFERENTIAL/PLATELET
Abs Immature Granulocytes: 0.07 10*3/uL (ref 0.00–0.07)
Basophils Absolute: 0 10*3/uL (ref 0.0–0.1)
Basophils Relative: 0 %
Eosinophils Absolute: 0 10*3/uL (ref 0.0–0.5)
Eosinophils Relative: 0 %
HCT: 43.8 % (ref 36.0–46.0)
Hemoglobin: 14.7 g/dL (ref 12.0–15.0)
Immature Granulocytes: 1 %
Lymphocytes Relative: 12 %
Lymphs Abs: 1.1 10*3/uL (ref 0.7–4.0)
MCH: 29.5 pg (ref 26.0–34.0)
MCHC: 33.6 g/dL (ref 30.0–36.0)
MCV: 88 fL (ref 80.0–100.0)
Monocytes Absolute: 0.4 10*3/uL (ref 0.1–1.0)
Monocytes Relative: 4 %
Neutro Abs: 8 10*3/uL — ABNORMAL HIGH (ref 1.7–7.7)
Neutrophils Relative %: 83 %
Platelets: 262 10*3/uL (ref 150–400)
RBC: 4.98 MIL/uL (ref 3.87–5.11)
RDW: 13.3 % (ref 11.5–15.5)
WBC: 9.5 10*3/uL (ref 4.0–10.5)
nRBC: 0 % (ref 0.0–0.2)

## 2019-03-07 LAB — COMPREHENSIVE METABOLIC PANEL
ALT: 20 U/L (ref 0–44)
AST: 23 U/L (ref 15–41)
Albumin: 4.5 g/dL (ref 3.5–5.0)
Alkaline Phosphatase: 95 U/L (ref 38–126)
Anion gap: 11 (ref 5–15)
BUN: 12 mg/dL (ref 8–23)
CO2: 24 mmol/L (ref 22–32)
Calcium: 9 mg/dL (ref 8.9–10.3)
Chloride: 105 mmol/L (ref 98–111)
Creatinine, Ser: 0.39 mg/dL — ABNORMAL LOW (ref 0.44–1.00)
GFR calc Af Amer: >60 mL/min (ref >60)
GFR calc non Af Amer: >60 mL/min (ref >60)
Glucose, Bld: 103 mg/dL — ABNORMAL HIGH (ref 70–99)
Potassium: 3.4 mmol/L — ABNORMAL LOW (ref 3.5–5.1)
Sodium: 140 mmol/L (ref 135–145)
Total Bilirubin: 0.7 mg/dL (ref 0.3–1.2)
Total Protein: 7.4 g/dL (ref 6.5–8.1)

## 2019-03-07 LAB — SARS CORONAVIRUS 2 BY RT PCR (HOSPITAL ORDER, PERFORMED IN ~~LOC~~ HOSPITAL LAB): SARS Coronavirus 2: NEGATIVE

## 2019-03-07 LAB — PROTIME-INR
INR: 1 (ref 0.8–1.2)
Prothrombin Time: 13.3 seconds (ref 11.4–15.2)

## 2019-03-07 LAB — TYPE AND SCREEN
ABO/RH(D): O POS
Antibody Screen: NEGATIVE

## 2019-03-07 MED ORDER — AMLODIPINE BESYLATE 5 MG PO TABS
5.0000 mg | ORAL_TABLET | Freq: Every day | ORAL | Status: DC
  Administered 2019-03-09 – 2019-03-11 (×3): 5 mg via ORAL
  Filled 2019-03-07 (×3): qty 1

## 2019-03-07 MED ORDER — ADULT MULTIVITAMIN W/MINERALS CH
1.0000 | ORAL_TABLET | Freq: Every day | ORAL | Status: DC
  Administered 2019-03-07 – 2019-03-10 (×3): 1 via ORAL
  Filled 2019-03-07 (×3): qty 1

## 2019-03-07 MED ORDER — HYDROCODONE-ACETAMINOPHEN 5-325 MG PO TABS
1.0000 | ORAL_TABLET | ORAL | Status: DC | PRN
  Administered 2019-03-07 – 2019-03-08 (×3): 1 via ORAL
  Filled 2019-03-07 (×3): qty 1

## 2019-03-07 MED ORDER — MELOXICAM 7.5 MG PO TABS
15.0000 mg | ORAL_TABLET | Freq: Every day | ORAL | Status: DC | PRN
  Filled 2019-03-07: qty 2

## 2019-03-07 MED ORDER — CLINDAMYCIN PHOSPHATE 600 MG/50ML IV SOLN
600.0000 mg | INTRAVENOUS | Status: AC
  Filled 2019-03-07: qty 50

## 2019-03-07 MED ORDER — CEFAZOLIN SODIUM-DEXTROSE 1-4 GM/50ML-% IV SOLN
1.0000 g | INTRAVENOUS | Status: AC
  Filled 2019-03-07 (×2): qty 50

## 2019-03-07 MED ORDER — VITAMIN D3 25 MCG (1000 UNIT) PO TABS
1000.0000 [IU] | ORAL_TABLET | Freq: Every day | ORAL | Status: DC
  Administered 2019-03-07 – 2019-03-10 (×3): 1000 [IU] via ORAL
  Filled 2019-03-07 (×8): qty 1

## 2019-03-07 MED ORDER — MORPHINE SULFATE (PF) 2 MG/ML IV SOLN: 1.0000 mg | INTRAVENOUS | Status: DC | PRN

## 2019-03-07 MED ORDER — ATORVASTATIN CALCIUM 10 MG PO TABS
10.0000 mg | ORAL_TABLET | Freq: Every day | ORAL | Status: DC
  Filled 2019-03-07 (×3): qty 1

## 2019-03-07 MED ORDER — LEVOTHYROXINE SODIUM 50 MCG PO TABS
75.0000 ug | ORAL_TABLET | Freq: Every day | ORAL | Status: DC
  Administered 2019-03-08 – 2019-03-11 (×4): 75 ug via ORAL
  Filled 2019-03-07 (×3): qty 1
  Filled 2019-03-07: qty 2

## 2019-03-07 MED ORDER — ONDANSETRON HCL 4 MG/2ML IJ SOLN: 4.0000 mg | Freq: Three times a day (TID) | INTRAMUSCULAR | Status: DC | PRN

## 2019-03-07 MED ORDER — POLYETHYLENE GLYCOL 3350 17 G PO PACK
17.0000 g | PACK | Freq: Every day | ORAL | Status: DC
  Administered 2019-03-09 – 2019-03-11 (×2): 17 g via ORAL
  Filled 2019-03-07 (×2): qty 1

## 2019-03-07 MED ORDER — SODIUM CHLORIDE 0.9 % IV SOLN
INTRAVENOUS | Status: DC
  Administered 2019-03-07: 16:00:00 via INTRAVENOUS

## 2019-03-07 MED ORDER — FLUTICASONE PROPIONATE 50 MCG/ACT NA SUSP
1.0000 | Freq: Every day | NASAL | Status: DC
  Filled 2019-03-07: qty 16

## 2019-03-07 NOTE — ED Notes (Signed)
Awaiting ortho call back

## 2019-03-07 NOTE — H&P (Addendum)
Lindsay Carney at [place] NAME: Lindsay Carney    MR#:  [id number]  DATE OF BIRTH:  08-29-1955  DATE OF ADMISSION:  03/07/2019  PRIMARY CARE PHYSICIAN: McLean-Scocuzza, Nino Glow, MD   REQUESTING/REFERRING PHYSICIAN: Mardee Postin, PA-C  CHIEF COMPLAINT:   Chief Complaint  Patient presents with   Fall   HISTORY OF PRESENT ILLNESS:  63 y.o. female with pertinent past medical history of hyperlipidemia, hypertension, hypothyroidism, and hiatal hernia presenting to the ED with a left wrist and right knee pain secondary to a slip and fall.  Patient states she saw a spider on the floor and sprayed it with an insecticide then mopped the area with a wet rag.  She was walking past the wet area when she suddenly slipped and fell without hitting her head on the floor.  Patient states she was unable to get up from the floor she therefore called her husband for help.  She noticed that her right leg was rotated and left wrist swelling.  She was unable to bear weight on her right leg therefore EMS was called and patient transported to the ED for further evaluation.  On arrival to the ED, she was afebrile with blood pressure 158/100 mm Hg and pulse rate 78 beats/min. There were no focal neurological deficits; he was alert and oriented x4, and he did not demonstrate any memory deficits.  Initial labs revealed potassium 3.4 with unremarkable CBC.  X-ray of left wrist shows fracture of the distal left radius.  X-ray and CT of the right knee shows comminuted impacted fracture of the lateral tibial plateau, small avulsion from the medial tibial spine with surrounding edema of the MCL.  PAST MEDICAL HISTORY:   Past Medical History:  Diagnosis Date   Anemia    age 70   Complication of anesthesia    Diffuse cystic mastopathy    Hiatal hernia    Hyperlipidemia    Hypertension    Hypothyroidism    PONV (postoperative nausea and vomiting)    nausea   Thyroid  disease 2011    PAST SURGICAL HISTORY:   Past Surgical History:  Procedure Laterality Date   ABDOMINAL HYSTERECTOMY     bladder attached   BLADDER SURGERY     BREAST CYST ASPIRATION Right    BREAST SURGERY Right 04/01/10   COLONOSCOPY  2011   HEMORRHOID SURGERY N/A 03/12/2017   Procedure: INTERNAL AND EXTERNAL HEMORRHOIDECTOMY;  Surgeon: Christene Lye, MD;  Location: [facility] ORS;  Service: General;  Laterality: N/A;   HEMORROIDECTOMY     lanced   lipoma removal  1999   inner knee   TOE SURGERY      SOCIAL HISTORY:   Social History   Tobacco Use   Smoking status: Never Smoker   Smokeless tobacco: Never Used  Substance Use Topics   Alcohol use: No    FAMILY HISTORY:   Family History  Problem Relation Age of Onset   Arthritis Mother    Hypertension Mother    Cancer Father        prostate and mouth   Hyperlipidemia Father    Diabetes Father    Breast cancer Paternal Aunt    Cancer Paternal Aunt     DRUG ALLERGIES:  No Known Allergies  REVIEW OF SYSTEMS:   Review of Systems  Constitutional: Negative for chills, fever, malaise/fatigue and weight loss.  HENT: Negative for congestion, hearing loss and sore throat.   Eyes: Negative  for blurred vision and double vision.  Respiratory: Negative for cough, shortness of breath and wheezing.   Cardiovascular: Negative for chest pain, palpitations, orthopnea and leg swelling.  Gastrointestinal: Negative for abdominal pain, diarrhea, nausea and vomiting.  Genitourinary: Negative for dysuria and urgency.  Musculoskeletal: Positive for falls. Negative for myalgias.       Right knee and left wrist pain  Skin: Negative for rash.  Neurological: Negative for dizziness, sensory change, speech change, focal weakness and headaches.  Psychiatric/Behavioral: Negative for depression.   MEDICATIONS AT HOME:   Prior to Admission medications   Medication Sig Start Date End Date Taking? Authorizing Provider    amLODipine (NORVASC) 5 MG tablet Take 1 tablet (5 mg total) by mouth daily. 10/08/18  Yes McLean-Scocuzza, Nino Glow, MD  aspirin 81 MG tablet Take 81 mg by mouth daily with supper.    Yes [provider]  atorvastatin (LIPITOR) 10 MG tablet Take 1 tablet (10 mg total) by mouth daily at 6 PM. 05/21/18  Yes McLean-Scocuzza, Nino Glow, MD  cholecalciferol (VITAMIN D) 1000 UNITS tablet Take 1,000 Units by mouth daily with supper.    Yes [provider]  levothyroxine (SYNTHROID, LEVOTHROID) 75 MCG tablet TAKE 1 TABLET BY MOUTH ON EMPTY STOMACH 30-60 MIN BEFORE BREAKFAST 05/21/18  Yes McLean-Scocuzza, Nino Glow, MD  meloxicam (MOBIC) 15 MG tablet Take 15 mg by mouth daily as needed for pain.  03/15/18  Yes [provider]  Multiple Vitamins-Minerals (ONE-A-DAY 50 PLUS) TABS Take 1 tablet by mouth daily with supper.    Yes [provider]  Omega-3 Fatty Acids (FISH OIL) 1200 MG CAPS Take 1,200 mg by mouth daily with supper.    Yes [provider]  polyethylene glycol powder (MIRALAX) powder Take 1 Container by mouth once.   Yes [provider]  Triamcinolone Acetonide (NASACORT AQ NA) Place 1 spray into the nose at bedtime.    Yes [provider]      VITAL SIGNS:  Blood pressure 124/69, pulse 76, temperature 98.2 F (36.8 C), resp. rate 18, height 5\' 3"  (1.6 m), weight 79.4 kg, SpO2 98 %.  PHYSICAL EXAMINATION:   Physical Exam  GENERAL:  63 y.o.-year-old patient lying in the bed with no acute distress.  EYES: Pupils equal, round, reactive to light and accommodation. No scleral icterus. Extraocular muscles intact.  HEENT: Head atraumatic, normocephalic. Oropharynx and nasopharynx clear.  NECK:  Supple, no jugular venous distention. No thyroid enlargement, no tenderness.  LUNGS: Normal breath sounds bilaterally, no wheezing, rales,rhonchi or crepitation. No use of accessory muscles of respiration.  CARDIOVASCULAR: S1, S2 normal. No murmurs,  rubs, or gallops.  ABDOMEN: Soft, nontender, nondistended. Bowel sounds present. No organomegaly or mass.  EXTREMITIES: No pedal edema, cyanosis, or clubbing. No rash or lesions. + pedal pulses MUSCULOSKELETAL: Normal bulk, and power was 5+ grip and elbow, knee, and ankle flexion and extension of the left leg and right arm. Swelling of left wrist splinted. Right knee immobilizer in place. NEUROLOGIC: Alert and oriented x 3. CN 2-12 intact. Sensation to light touch and cold stimuli intact bilaterally. Babinski is downgoing. DTR's (biceps, patellar, and achilles) 2+ and symmetric throughout. Gait not tested due to safety concern. PSYCHIATRIC: The patient is alert and oriented x 3.  SKIN: No obvious rash, lesion, or ulcer.   DATA REVIEWED:  LABORATORY PANEL:   CBC Recent Labs  Lab 03/07/19 1102  WBC 9.5  HGB 14.7  HCT 43.8  PLT 262   ------------------------------------------------------------------------------------------------------------------  Chemistries  Recent Labs  Lab 03/07/19 1102  NA 140  K 3.4*  CL 105  CO2 24  GLUCOSE 103*  BUN 12  CREATININE 0.39*  CALCIUM 9.0  AST 23  ALT 20  ALKPHOS 95  BILITOT 0.7   ------------------------------------------------------------------------------------------------------------------  Cardiac Enzymes No results for input(s): TROPONINI in the last 168 hours. ------------------------------------------------------------------------------------------------------------------  RADIOLOGY:  Dg Wrist Complete Left  Result Date: 03/07/2019 CLINICAL DATA:  LEFT wrist pain post recent fall EXAM: LEFT WRIST - COMPLETE 3+ VIEW COMPARISON:  None FINDINGS: Osseous demineralization. Impacted transverse metaphyseal fracture of the distal LEFT radius. Dorsal tilt of distal radial articular surface. Intra-articular extension at radiocarpal joint seen. No additional fracture, dislocation, or bone destruction. Advanced degenerative changes first  CMC joint. IMPRESSION: Impacted transverse metaphyseal fracture of the distal LEFT radius with intra-articular extension at radiocarpal joint and dorsal tilt of distal radial articular surface. Electronically Signed   By: Lavonia Dana M.D.   On: 03/07/2019 09:02   Ct Knee Right Wo Contrast  Result Date: 03/07/2019 CLINICAL DATA:  Proximal right tibia fracture secondary to a fall. EXAM: CT OF THE RIGHT KNEE WITHOUT CONTRAST TECHNIQUE: Multidetector CT imaging of the right knee was performed according to the standard protocol. Multiplanar CT image reconstructions were also generated. COMPARISON:  Radiographs dated 03/07/2019 FINDINGS: Bones/Joint/Cartilage There is a comminuted impacted fracture of the lateral tibial plateau. Maximum depression is approximately 12 mm. There are coronal and sagittal components to the fracture extending into the proximal lateral tibial metaphysis. There is a small avulsion from the medial tibial spine. The medial tibial plateau is intact. Distal femur and proximal fibula are intact. Large hemarthrosis. Ligaments: Cruciate ligaments appear to be intact. Lateral collateral ligament is intact. There is edema around the medial collateral ligament which could represent a sprain of the MCL. Suboptimally assessed by CT. Muscles and Tendons Negative. Soft tissues Negative. IMPRESSION: 1. Comminuted impacted fracture of the lateral tibial plateau as described. 2. Small avulsion from the medial tibial spine. 3. Edema around the medial collateral ligament which could represent a sprain of the MCL. Electronically Signed   By: Lorriane Shire M.D.   On: 03/07/2019 10:49   Dg Knee Complete 4 Views Right  Result Date: 03/07/2019 CLINICAL DATA:  Pain secondary to recent fall, RIGHT lateral knee pain EXAM: RIGHT KNEE - COMPLETE 4+ VIEW COMPARISON:  None FINDINGS: Osseous demineralization. Joint spaces preserved. Small joint effusion. Comminuted depressed fracture of the lateral plateau. Margins of  fracture fragments appears slightly indistinct suggesting this is a subacute injury. Avulsion fracture at tip of medial tibial spine. No additional fracture, dislocation or bone destruction. IMPRESSION: Comminuted depressed fracture of the RIGHT lateral tibial plateau, question subacute. Tiny avulsion fracture at medial tibial plateau. Electronically Signed   By: Lavonia Dana M.D.   On: 03/07/2019 09:01    EKG:  EKG: normal EKG, normal sinus rhythm, unchanged from previous tracings. Vent. rate 79 BPM PR interval 168 ms QRS duration 90 ms QT/QTc 368/421 ms P-R-T axes 52 5 13 IMPRESSION AND PLAN:   63 y.o. female with pertinent past medical history of hyperlipidemia, hypertension, hypothyroidism, and hiatal hernia presenting to the ED with a left wrist and right knee pain secondary to a slip and fall.  1.  Right tibial fracture -secondary to mechanical fall - CT right knee reviewed and shows comminuted impacted fracture of the lateral tibial plateau with surrounding edema of the MCL - Admit to orthopedic unit - Orthopedic consult, case discussed with  on-call orthopedic Dr. Sabra Heck who recommends surgery possibly tomorrow. - keep n.p.o. after midnight - Knee immobilizer - PRN pain management - From medical standpoint patient cleared for surgery  2.  Left wrist fracture-secondary to mechanical fall - X-ray shows compact transverse metaphyseal fracture of the left distal radius - Management as above  3. HLD - Continue atorvastatin 10mg  PO qhs  4. HTN  - Continue amlodipine  5. Hypothyroidism - continue Synthroid  6. DVT prophylaxis - Hold anti-coagulation  pending procedure   Patient seen and examined, agree with detailed note below. Patient presentation and plan discussed with APP.    All the records are reviewed and case discussed with ED provider. Management plans discussed with the patient, family and they are in agreement.  CODE STATUS: FULL  TOTAL TIME TAKING CARE OF THIS  PATIENT: 50 minutes.    on 03/07/2019 at 3:04 PM  Rufina Falco, DNP, FNP-BC [facility] Nurse Practitioner Between 7am to 6pm - Pager [phone]  After 6pm go to [email or web address] - password [email or web address] [facility]  Office  [phone]  CC: Primary care physician; McLean-Scocuzza, Nino Glow, MD

## 2019-03-07 NOTE — ED Notes (Signed)
ED TO INPATIENT HANDOFF REPORT  ED Nurse Name and Phone #: Bodie Abernethy [phone]  S Name/Age/Gender Lindsay Carney 63 y.o. female Room/Bed: ED41A/ED41A  Code Status   Code Status: Full Code  Home/SNF/Other home patient oriented x 4 Is this baseline? yes  Triage Complete: Triage complete  Chief Complaint Fall   Triage Note No notes on file   Allergies No Known Allergies  Level of Care/Admitting Diagnosis ED Disposition    ED Disposition Condition [place]: [facility] [100120]  Level of Care: Med-Surg [16]  Covid Evaluation: Person Under Investigation (PUI)  Diagnosis: Right tibial fracture [176160]  Admitting Physician: Eula Flax  Attending Physician: Rufina Falco ACHIENG [phone]  Estimated length of stay: past midnight tomorrow  Certification:: I certify this patient will need inpatient services for at least 2 midnights  PT Class (Do Not Modify): Inpatient [101]  PT Acc Code (Do Not Modify): Private [1]       B Medical/Surgery History Past Medical History:  Diagnosis Date  . Anemia    age 58  . Complication of anesthesia   . Diffuse cystic mastopathy   . Hiatal hernia   . Hyperlipidemia   . Hypertension   . Hypothyroidism   . PONV (postoperative nausea and vomiting)    nausea  . Thyroid disease 2011   Past Surgical History:  Procedure Laterality Date  . ABDOMINAL HYSTERECTOMY     bladder attached  . BLADDER SURGERY    . BREAST CYST ASPIRATION Right   . BREAST SURGERY Right 04/01/10  . COLONOSCOPY  2011  . HEMORRHOID SURGERY N/A 03/12/2017   Procedure: INTERNAL AND EXTERNAL HEMORRHOIDECTOMY;  Surgeon: Christene Lye, MD;  Location: [facility] ORS;  Service: General;  Laterality: N/A;  . HEMORROIDECTOMY     lanced  . lipoma removal  1999   inner knee  . TOE SURGERY       A IV Location/Drains/Wounds Patient Lines/Drains/Airways Status   Active Line/Drains/Airways    Name:    Placement date:   Placement time:   Site:   Days:   Peripheral IV 03/07/19 Right Wrist   03/07/19    1111    Wrist   less than 1   Incision (Closed) 03/12/17 Rectum   03/12/17    1337     725          Intake/Output Last 24 hours No intake or output data in the 24 hours ending 03/07/19 1212  Labs/Imaging Results for orders placed or performed during the hospital encounter of 03/07/19 (from the past 48 hour(s))  Comprehensive metabolic panel     Status: Abnormal   Collection Time: 03/07/19 11:02 AM  Result Value Ref Range   Sodium 140 135 - 145 mmol/L   Potassium 3.4 (L) 3.5 - 5.1 mmol/L   Chloride 105 98 - 111 mmol/L   CO2 24 22 - 32 mmol/L   Glucose, Bld 103 (H) 70 - 99 mg/dL   BUN 12 8 - 23 mg/dL   Creatinine, Ser 0.39 (L) 0.44 - 1.00 mg/dL   Calcium 9.0 8.9 - 10.3 mg/dL   Total Protein 7.4 6.5 - 8.1 g/dL   Albumin 4.5 3.5 - 5.0 g/dL   AST 23 15 - 41 U/L   ALT 20 0 - 44 U/L   Alkaline Phosphatase 95 38 - 126 U/L   Total Bilirubin 0.7 0.3 - 1.2 mg/dL   GFR calc non Af Amer >60 >60 mL/min  GFR calc Af Amer >60 >60 mL/min   Anion gap 11 5 - 15    Comment: Performed at [facility], [address] [place], [place] [address]  CBC with Differential     Status: Abnormal   Collection Time: 03/07/19 11:02 AM  Result Value Ref Range   WBC 9.5 4.0 - 10.5 K/uL   RBC 4.98 3.87 - 5.11 MIL/uL   Hemoglobin 14.7 12.0 - 15.0 g/dL   HCT 43.8 36.0 - 46.0 %   MCV 88.0 80.0 - 100.0 fL   MCH 29.5 26.0 - 34.0 pg   MCHC 33.6 30.0 - 36.0 g/dL   RDW 13.3 11.5 - 15.5 %   Platelets 262 150 - 400 K/uL   nRBC 0.0 0.0 - 0.2 %   Neutrophils Relative % 83 %   Neutro Abs 8.0 (H) 1.7 - 7.7 K/uL   Lymphocytes Relative 12 %   Lymphs Abs 1.1 0.7 - 4.0 K/uL   Monocytes Relative 4 %   Monocytes Absolute 0.4 0.1 - 1.0 K/uL   Eosinophils Relative 0 %   Eosinophils Absolute 0.0 0.0 - 0.5 K/uL   Basophils Relative 0 %   Basophils Absolute 0.0 0.0 - 0.1 K/uL   Immature Granulocytes 1 %   Abs  Immature Granulocytes 0.07 0.00 - 0.07 K/uL    Comment: Performed at [facility], [address] [place], [place] [address]  Protime-INR     Status: None   Collection Time: 03/07/19 11:02 AM  Result Value Ref Range   Prothrombin Time 13.3 11.4 - 15.2 seconds   INR 1.0 0.8 - 1.2    Comment: (NOTE) INR goal varies based on device and disease states. Performed at [facility], [address] [place], [place] [address]    Dg Wrist Complete Left  Result Date: 03/07/2019 CLINICAL DATA:  LEFT wrist pain post recent fall EXAM: LEFT WRIST - COMPLETE 3+ VIEW COMPARISON:  None FINDINGS: Osseous demineralization. Impacted transverse metaphyseal fracture of the distal LEFT radius. Dorsal tilt of distal radial articular surface. Intra-articular extension at radiocarpal joint seen. No additional fracture, dislocation, or bone destruction. Advanced degenerative changes first CMC joint. IMPRESSION: Impacted transverse metaphyseal fracture of the distal LEFT radius with intra-articular extension at radiocarpal joint and dorsal tilt of distal radial articular surface. Electronically Signed   By: Lavonia Dana M.D.   On: 03/07/2019 09:02   Ct Knee Right Wo Contrast  Result Date: 03/07/2019 CLINICAL DATA:  Proximal right tibia fracture secondary to a fall. EXAM: CT OF THE RIGHT KNEE WITHOUT CONTRAST TECHNIQUE: Multidetector CT imaging of the right knee was performed according to the standard protocol. Multiplanar CT image reconstructions were also generated. COMPARISON:  Radiographs dated 03/07/2019 FINDINGS: Bones/Joint/Cartilage There is a comminuted impacted fracture of the lateral tibial plateau. Maximum depression is approximately 12 mm. There are coronal and sagittal components to the fracture extending into the proximal lateral tibial metaphysis. There is a small avulsion from the medial tibial spine. The medial tibial plateau is intact. Distal femur and proximal fibula are intact. Large  hemarthrosis. Ligaments: Cruciate ligaments appear to be intact. Lateral collateral ligament is intact. There is edema around the medial collateral ligament which could represent a sprain of the MCL. Suboptimally assessed by CT. Muscles and Tendons Negative. Soft tissues Negative. IMPRESSION: 1. Comminuted impacted fracture of the lateral tibial plateau as described. 2. Small avulsion from the medial tibial spine. 3. Edema around the medial collateral ligament which could represent a sprain  of the MCL. Electronically Signed   By: Lorriane Shire M.D.   On: 03/07/2019 10:49   Dg Knee Complete 4 Views Right  Result Date: 03/07/2019 CLINICAL DATA:  Pain secondary to recent fall, RIGHT lateral knee pain EXAM: RIGHT KNEE - COMPLETE 4+ VIEW COMPARISON:  None FINDINGS: Osseous demineralization. Joint spaces preserved. Small joint effusion. Comminuted depressed fracture of the lateral plateau. Margins of fracture fragments appears slightly indistinct suggesting this is a subacute injury. Avulsion fracture at tip of medial tibial spine. No additional fracture, dislocation or bone destruction. IMPRESSION: Comminuted depressed fracture of the RIGHT lateral tibial plateau, question subacute. Tiny avulsion fracture at medial tibial plateau. Electronically Signed   By: Lavonia Dana M.D.   On: 03/07/2019 09:01    Pending Labs Unresulted Labs (From admission, onward)    Start     Ordered   03/07/19 1209  Type and screen  Once,   STAT     03/07/19 1209   03/07/19 1047  HIV antibody (Routine Testing)  Once,   STAT     03/07/19 1050   03/07/19 1024  SARS CORONAVIRUS 2 Nasal Swab Aptima Multi Swab  (Asymptomatic Patients Labs)  Once,   STAT    Question Answer Comment  Is this test for diagnosis or screening Screening   Symptomatic for COVID-19 as defined by CDC No   Hospitalized for COVID-19 No   Admitted to ICU for COVID-19 No   Previously tested for COVID-19 No   Resident in a congregate (group) care setting No    Employed in healthcare setting No   Pregnant No      03/07/19 1023          Vitals/Pain Today's Vitals   03/07/19 0824 03/07/19 0825 03/07/19 0836 03/07/19 1208  BP:   (!) 158/100 124/69  Pulse:   78 76  Resp:   18 18  Temp:   98 F (36.7 C) 98.2 F (36.8 C)  TempSrc:   Oral   SpO2:   97% 98%  Weight:  79.4 kg    Height:  5\' 3"  (1.6 m)    PainSc: 3        Isolation Precautions No active isolations  Medications Medications  0.9 %  sodium chloride infusion (has no administration in time range)  ceFAZolin (ANCEF) IVPB 1 g/50 mL premix (has no administration in time range)  clindamycin (CLEOCIN) IVPB 600 mg (has no administration in time range)  HYDROcodone-acetaminophen (NORCO/VICODIN) 5-325 MG per tablet 1 tablet (has no administration in time range)  morphine 2 MG/ML injection 1 mg (has no administration in time range)  amLODipine (NORVASC) tablet 5 mg (has no administration in time range)  atorvastatin (LIPITOR) tablet 10 mg (has no administration in time range)  levothyroxine (SYNTHROID) tablet 75 mcg (has no administration in time range)  polyethylene glycol powder (GLYCOLAX/MIRALAX) container 255 g (has no administration in time range)  cholecalciferol (VITAMIN D) tablet 1,000 Units (has no administration in time range)  One-A-Day 50 Plus TABS 1 tablet (has no administration in time range)  fluticasone (FLONASE) 50 MCG/ACT nasal spray 1 spray (has no administration in time range)  meloxicam (MOBIC) tablet 15 mg (has no administration in time range)    Mobility  Low fall risk   Focused Assessments    R Recommendations: See Admitting Provider Note  Report given to:   Additional Notes:

## 2019-03-07 NOTE — Consult Note (Signed)
ORTHOPAEDIC CONSULTATION  REQUESTING PHYSICIAN: Lang Snow,*  Chief Complaint: Right knee and left wrist pain  HPI: Lindsay Carney is a 63 y.o. female who complains of right knee and left wrist pain after a fall at home on a wet spot in her bathroom this morning.  Patient was seen in the emergency room where exam and x-rays revealed a depressed comminuted right lateral tibial plateau fracture and a nondisplaced left radial styloid fracture the patient.  The patient was admitted for operative fixation of the right lateral tibial plateau and splinting of the left wrist.  I have discussed treatment with the patient and her husband at length.  I recommended surgical fixation of the right lateral tibial plateau and they are in agreement with this.  The risks and benefits of surgery discussed with him at length.  Postop protocol were discussed.  We will proceed with this tomorrow morning.  Past Medical History:  Diagnosis Date  . Anemia    age 62  . Complication of anesthesia   . Diffuse cystic mastopathy   . Hiatal hernia   . Hyperlipidemia   . Hypertension   . Hypothyroidism   . PONV (postoperative nausea and vomiting)    nausea  . Thyroid disease 2011   Past Surgical History:  Procedure Laterality Date  . ABDOMINAL HYSTERECTOMY     bladder attached  . BLADDER SURGERY    . BREAST CYST ASPIRATION Right   . BREAST SURGERY Right 04/01/10  . COLONOSCOPY  2011  . HEMORRHOID SURGERY N/A 03/12/2017   Procedure: INTERNAL AND EXTERNAL HEMORRHOIDECTOMY;  Surgeon: Christene Lye, MD;  Location: [facility] ORS;  Service: General;  Laterality: N/A;  . HEMORROIDECTOMY     lanced  . lipoma removal  1999   inner knee  . TOE SURGERY     Social History   Socioeconomic History  . Marital status: Married    Spouse name: Not on file  . Number of children: Not on file  . Years of education: Not on file  . Highest education level: Not on file  Occupational History  . Not on file   Social Needs  . Financial resource strain: Not on file  . Food insecurity    Worry: Not on file    Inability: Not on file  . Transportation needs    Medical: Not on file    Non-medical: Not on file  Tobacco Use  . Smoking status: Never Smoker  . Smokeless tobacco: Never Used  Substance and Sexual Activity  . Alcohol use: No  . Drug use: No  . Sexual activity: Not on file  Lifestyle  . Physical activity    Days per week: Not on file    Minutes per session: Not on file  . Stress: Not on file  Relationships  . Social Herbalist on phone: Not on file    Gets together: Not on file    Attends religious service: Not on file    Active member of club or organization: Not on file    Attends meetings of clubs or organizations: Not on file    Relationship status: Not on file  Other Topics Concern  . Not on file  Social History Narrative   Married    Family History  Problem Relation Age of Onset  . Arthritis Mother   . Hypertension Mother   . Cancer Father        prostate and mouth  . Hyperlipidemia Father   .  Diabetes Father   . Breast cancer Paternal Aunt   . Cancer Paternal Aunt    No Known Allergies Prior to Admission medications   Medication Sig Start Date End Date Taking? Authorizing Provider  amLODipine (NORVASC) 5 MG tablet Take 1 tablet (5 mg total) by mouth daily. 10/08/18  Yes McLean-Scocuzza, Nino Glow, MD  aspirin 81 MG tablet Take 81 mg by mouth daily with supper.    Yes [provider]  atorvastatin (LIPITOR) 10 MG tablet Take 1 tablet (10 mg total) by mouth daily at 6 PM. 05/21/18  Yes McLean-Scocuzza, Nino Glow, MD  cholecalciferol (VITAMIN D) 1000 UNITS tablet Take 1,000 Units by mouth daily with supper.    Yes [provider]  levothyroxine (SYNTHROID, LEVOTHROID) 75 MCG tablet TAKE 1 TABLET BY MOUTH ON EMPTY STOMACH 30-60 MIN BEFORE BREAKFAST 05/21/18  Yes McLean-Scocuzza, Nino Glow, MD  meloxicam (MOBIC) 15 MG tablet Take 15 mg by  mouth daily as needed for pain.  03/15/18  Yes [provider]  Multiple Vitamins-Minerals (ONE-A-DAY 50 PLUS) TABS Take 1 tablet by mouth daily with supper.    Yes [provider]  Omega-3 Fatty Acids (FISH OIL) 1200 MG CAPS Take 1,200 mg by mouth daily with supper.    Yes [provider]  polyethylene glycol powder (MIRALAX) powder Take 1 Container by mouth once.   Yes [provider]  Triamcinolone Acetonide (NASACORT AQ NA) Place 1 spray into the nose at bedtime.    Yes [provider]   Dg Wrist Complete Left  Result Date: 03/07/2019 CLINICAL DATA:  LEFT wrist pain post recent fall EXAM: LEFT WRIST - COMPLETE 3+ VIEW COMPARISON:  None FINDINGS: Osseous demineralization. Impacted transverse metaphyseal fracture of the distal LEFT radius. Dorsal tilt of distal radial articular surface. Intra-articular extension at radiocarpal joint seen. No additional fracture, dislocation, or bone destruction. Advanced degenerative changes first CMC joint. IMPRESSION: Impacted transverse metaphyseal fracture of the distal LEFT radius with intra-articular extension at radiocarpal joint and dorsal tilt of distal radial articular surface. Electronically Signed   By: Lavonia Dana M.D.   On: 03/07/2019 09:02   Ct Knee Right Wo Contrast  Result Date: 03/07/2019 CLINICAL DATA:  Proximal right tibia fracture secondary to a fall. EXAM: CT OF THE RIGHT KNEE WITHOUT CONTRAST TECHNIQUE: Multidetector CT imaging of the right knee was performed according to the standard protocol. Multiplanar CT image reconstructions were also generated. COMPARISON:  Radiographs dated 03/07/2019 FINDINGS: Bones/Joint/Cartilage There is a comminuted impacted fracture of the lateral tibial plateau. Maximum depression is approximately 12 mm. There are coronal and sagittal components to the fracture extending into the proximal lateral tibial metaphysis. There is a small avulsion from the medial tibial spine. The  medial tibial plateau is intact. Distal femur and proximal fibula are intact. Large hemarthrosis. Ligaments: Cruciate ligaments appear to be intact. Lateral collateral ligament is intact. There is edema around the medial collateral ligament which could represent a sprain of the MCL. Suboptimally assessed by CT. Muscles and Tendons Negative. Soft tissues Negative. IMPRESSION: 1. Comminuted impacted fracture of the lateral tibial plateau as described. 2. Small avulsion from the medial tibial spine. 3. Edema around the medial collateral ligament which could represent a sprain of the MCL. Electronically Signed   By: Lorriane Shire M.D.   On: 03/07/2019 10:49   Dg Knee Complete 4 Views Right  Result Date: 03/07/2019 CLINICAL DATA:  Pain secondary to recent fall, RIGHT lateral knee pain EXAM: RIGHT KNEE - COMPLETE  4+ VIEW COMPARISON:  None FINDINGS: Osseous demineralization. Joint spaces preserved. Small joint effusion. Comminuted depressed fracture of the lateral plateau. Margins of fracture fragments appears slightly indistinct suggesting this is a subacute injury. Avulsion fracture at tip of medial tibial spine. No additional fracture, dislocation or bone destruction. IMPRESSION: Comminuted depressed fracture of the RIGHT lateral tibial plateau, question subacute. Tiny avulsion fracture at medial tibial plateau. Electronically Signed   By: Lavonia Dana M.D.   On: 03/07/2019 09:01    Positive ROS: All other systems have been reviewed and were otherwise negative with the exception of those mentioned in the HPI and as above.  Physical Exam: General: Alert, no acute distress Cardiovascular: No pedal edema Respiratory: No cyanosis, no use of accessory musculature GI: No organomegaly, abdomen is soft and non-tender Skin: No lesions in the area of chief complaint Neurologic: Sensation intact distally Psychiatric: Patient is competent for consent with normal mood and affect Lymphatic: No axillary or cervical  lymphadenopathy  MUSCULOSKELETAL: Patient is lying quietly in bed in minimal pain.  The left wrist shows tenderness over the radial styloid with slight swelling.  There is minimal bruising.  Neurovascular status is good.  She has a Velcro brace.  The right knee shows mild to moderate swelling.  There is no ecchymosis is yet.  Neurovascular status is good distally with no sign of compartment syndrome.  Hips and left knee are normal.  No other orthopedic injuries are noted.  Assessment: Depressed comminuted right lateral tibial plateau fracture Nondisplaced left radial styloid fracture  Plan: Open reduction internal fixation right lateral tibial plateau tomorrow. Casting of the left wrist.    Park Breed, MD [phone]   03/07/2019 6:29 PM

## 2019-03-07 NOTE — ED Provider Notes (Addendum)
[facility] Emergency Department Provider Note   ____________________________________________   First MD Initiated Contact with Patient 03/07/19 0831     (approximate)  I have reviewed the triage vital signs and the nursing notes.   HISTORY  Chief Complaint Fall    HPI Lindsay Carney is a 63 y.o. female patient presents with left wrist and right knee pain secondary to a slip and fall.  Patient arrived via EMS secondary to a slip and fall in the bathroom.  Patient state unable to bear weight to the right lower extremity secondary to pain.  Patient also has swelling to the distal radius of the left wrist.  Patient has full equal range of motion of the left wrist.  Patient denies loss of sensation.  Patient rates her pain as a 3/10.  Patient described pain is "achy".  No palliative measures prior to arrival.         Past Medical History:  Diagnosis Date  . Anemia    age 73  . Complication of anesthesia   . Diffuse cystic mastopathy   . Hiatal hernia   . Hyperlipidemia   . Hypertension   . Hypothyroidism   . PONV (postoperative nausea and vomiting)    nausea  . Thyroid disease 2011    Patient Active Problem List   Diagnosis Date Noted  . Essential hypertension 09/08/2018  . Elevated liver enzymes 05/21/2018  . Lipoma 05/21/2018  . Low back pain 03/13/2018  . Hyperpigmented toenail lesion 03/13/2018  . Well woman exam without gynecological exam 03/01/2018  . Hypothyroidism 07/24/2014  . HLD (hyperlipidemia) 07/24/2014  . Constipation 07/24/2014  . Hot flashes 07/24/2014  . History of pituitary disease 07/24/2014  . Obesity (BMI 30-39.9) 07/24/2014  . History of fibrocystic disease of breast 03/01/2013    Past Surgical History:  Procedure Laterality Date  . ABDOMINAL HYSTERECTOMY     bladder attached  . BLADDER SURGERY    . BREAST CYST ASPIRATION Right   . BREAST SURGERY Right 04/01/10  . COLONOSCOPY  2011  . HEMORRHOID SURGERY N/A  03/12/2017   Procedure: INTERNAL AND EXTERNAL HEMORRHOIDECTOMY;  Surgeon: Christene Lye, MD;  Location: [facility] ORS;  Service: General;  Laterality: N/A;  . HEMORROIDECTOMY     lanced  . lipoma removal  1999   inner knee  . TOE SURGERY      Prior to Admission medications   Medication Sig Start Date End Date Taking? Authorizing Provider  amLODipine (NORVASC) 5 MG tablet Take 1 tablet (5 mg total) by mouth daily. 10/08/18   McLean-Scocuzza, Nino Glow, MD  aspirin 81 MG tablet Take 81 mg by mouth daily with supper.     [provider]  atorvastatin (LIPITOR) 10 MG tablet Take 1 tablet (10 mg total) by mouth daily at 6 PM. 05/21/18   McLean-Scocuzza, Nino Glow, MD  cholecalciferol (VITAMIN D) 1000 UNITS tablet Take 1,000 Units by mouth daily with supper.     [provider]  levothyroxine (SYNTHROID, LEVOTHROID) 75 MCG tablet TAKE 1 TABLET BY MOUTH ON EMPTY STOMACH 30-60 MIN BEFORE BREAKFAST 05/21/18   McLean-Scocuzza, Nino Glow, MD  meloxicam (MOBIC) 15 MG tablet TK 1 T PO QD 03/15/18   [provider]  Multiple Vitamins-Minerals (ONE-A-DAY 50 PLUS) TABS Take 1 tablet by mouth daily with supper.     [provider]  Omega-3 Fatty Acids (FISH OIL) 1200 MG CAPS Take 1,200 mg by mouth daily with supper.  [provider]  polyethylene glycol powder (MIRALAX) powder Take 1 Container by mouth once.    [provider]  Triamcinolone Acetonide (NASACORT AQ NA) Place 1 spray into the nose at bedtime.     [provider]    Allergies Patient has no known allergies.  Family History  Problem Relation Age of Onset  . Arthritis Mother   . Hypertension Mother   . Cancer Father        prostate and mouth  . Hyperlipidemia Father   . Diabetes Father   . Breast cancer Paternal Aunt   . Cancer Paternal Aunt     Social History Social History   Tobacco Use  . Smoking status: Never Smoker  . Smokeless tobacco: Never Used  Substance Use  Topics  . Alcohol use: No  . Drug use: No    Review of Systems Constitutional: No fever/chills Eyes: No visual changes. ENT: No sore throat. Cardiovascular: Denies chest pain. Respiratory: Denies shortness of breath. Gastrointestinal: No abdominal pain.  No nausea, no vomiting.  No diarrhea.  No constipation. Genitourinary: Negative for dysuria. Musculoskeletal: Left wrist and right knee pain.   Skin: Negative for rash. Neurological: Negative for headaches, focal weakness or numbness. Endocrine:  Hyperlipidemia, hypertension, and hypothyroidism. Hematological/Lymphatic:  Anemia.   ____________________________________________   PHYSICAL EXAM:  VITAL SIGNS: ED Triage Vitals  Enc Vitals Group     BP --      Pulse --      Resp --      Temp --      Temp src --      SpO2 --      Weight 03/07/19 0825 175 lb (79.4 kg)     Height 03/07/19 0825 5\' 3"  (1.6 m)     Head Circumference --      Peak Flow --      Pain Score 03/07/19 0824 3     Pain Loc --      Pain Edu? --      Excl. in [place]? --     Constitutional: Alert and oriented. Well appearing and in no acute distress. Cardiovascular: Normal rate, regular rhythm. Grossly normal heart sounds.  Good peripheral circulation. Respiratory: Normal respiratory effort.  No retractions. Lungs CTAB. Musculoskeletal: No obvious deformity to the left wrist.  Moderate guarding palpation of distal radius.  No obvious deformity to the right knee.  No obvious effusion.  Patient has moderate guarding palpation anterior patella.   Neurologic:  Normal speech and language. No gross focal neurologic deficits are appreciated. No gait instability. Skin:  Skin is warm, dry and intact. No rash noted. Psychiatric: Mood and affect are normal. Speech and behavior are normal.  ____________________________________________   LABS (all labs ordered are listed, but only abnormal results are displayed)  Labs Reviewed  SARS CORONAVIRUS 2  COMPREHENSIVE  METABOLIC PANEL  CBC WITH DIFFERENTIAL/PLATELET  PROTIME-INR  TYPE AND SCREEN   ____________________________________________  EKG   ____________________________________________  RADIOLOGY  ED MD interpretation:    Official radiology report(s): Dg Wrist Complete Left  Result Date: 03/07/2019 CLINICAL DATA:  LEFT wrist pain post recent fall EXAM: LEFT WRIST - COMPLETE 3+ VIEW COMPARISON:  None FINDINGS: Osseous demineralization. Impacted transverse metaphyseal fracture of the distal LEFT radius. Dorsal tilt of distal radial articular surface. Intra-articular extension at radiocarpal joint seen. No additional fracture, dislocation, or bone destruction. Advanced degenerative changes first CMC joint. IMPRESSION: Impacted transverse metaphyseal fracture of the distal LEFT radius with intra-articular extension at  radiocarpal joint and dorsal tilt of distal radial articular surface. Electronically Signed   By: Lavonia Dana M.D.   On: 03/07/2019 09:02   Dg Knee Complete 4 Views Right  Result Date: 03/07/2019 CLINICAL DATA:  Pain secondary to recent fall, RIGHT lateral knee pain EXAM: RIGHT KNEE - COMPLETE 4+ VIEW COMPARISON:  None FINDINGS: Osseous demineralization. Joint spaces preserved. Small joint effusion. Comminuted depressed fracture of the lateral plateau. Margins of fracture fragments appears slightly indistinct suggesting this is a subacute injury. Avulsion fracture at tip of medial tibial spine. No additional fracture, dislocation or bone destruction. IMPRESSION: Comminuted depressed fracture of the RIGHT lateral tibial plateau, question subacute. Tiny avulsion fracture at medial tibial plateau. Electronically Signed   By: Lavonia Dana M.D.   On: 03/07/2019 09:01    ____________________________________________   PROCEDURES  Procedure(s) performed (including Critical Care):  .Splint Application  Date/Time: 03/07/2019 11:59 AM Performed by: Tenny Craw, NT Authorized by: Sable Feil, PA-C   Consent:    Consent obtained:  Verbal   Consent given by:  Patient   Risks discussed:  Numbness, pain and swelling Pre-procedure details:    Sensation:  Normal Procedure details:    Laterality:  Left   Location:  Wrist   Wrist:  L wrist   Splint type:  Wrist   Supplies:  Prefabricated splint Post-procedure details:    Pain:  Unchanged   Sensation:  Normal   Patient tolerance of procedure:  Tolerated well, no immediate complications  .Splint Application  Date/Time: 03/07/2019 12:01 PM Performed by: Sable Feil, PA-C Authorized by: Sable Feil, PA-C   Consent:    Consent obtained:  Verbal   Consent given by:  Patient   Risks discussed:  Numbness, pain and swelling Pre-procedure details:    Sensation:  Normal Procedure details:    Laterality:  Right   Location:  Knee   Knee:  R knee   Strapping: no     Splint type:  Knee immobilizer Post-procedure details:    Pain:  Unchanged   Sensation:  Normal   Patient tolerance of procedure:  Tolerated well, no immediate complications     ____________________________________________   INITIAL IMPRESSION / ASSESSMENT AND PLAN / ED COURSE  As part of my medical decision making, I reviewed the following data within the [place] was evaluated in Emergency Department on 03/07/2019 for the symptoms described in the history of present illness. She was evaluated in the context of the global COVID-19 pandemic, which necessitated consideration that the patient might be at risk for infection with the SARS-CoV-2 virus that causes COVID-19. Institutional protocols and algorithms that pertain to the evaluation of patients at risk for COVID-19 are in a state of rapid change based on information released by regulatory bodies including the CDC and federal and state organizations. These policies and algorithms were followed during the patient's care in the ED.  Patient presents  with left wrist pain and right knee pain secondary to a fall.  X-rays reveals compact transverse metaphyseal fracture of the left distal radius.  X-rays also reveals a comminuted displaced fracture of the lateral tibial plateau.  She also has an avulsion fracture tip of the medial tibial spine.  Discussed x-ray findings with on-call orthopedic doctor.  Patient will be admitted for surgery.       ____________________________________________   FINAL CLINICAL IMPRESSION(S) / ED DIAGNOSES  Final diagnoses:  Closed fracture of medial portion of right tibial plateau, initial encounter  Left wrist fracture, closed, initial encounter     ED Discharge Orders    None       Note:  This document was prepared using Dragon voice recognition software and may include unintentional dictation errors.    Sable Feil, PA-C 03/07/19 1026    Sable Feil, PA-C 03/07/19 1202    Arta Silence, MD 03/08/19 [phone]

## 2019-03-08 ENCOUNTER — Inpatient Hospital Stay: Payer: BC Managed Care – PPO

## 2019-03-08 ENCOUNTER — Inpatient Hospital Stay: Payer: BC Managed Care – PPO | Admitting: Certified Registered Nurse Anesthetist

## 2019-03-08 ENCOUNTER — Encounter
Admission: EM | Disposition: A | Discharge status: Home Health | Payer: Self-pay | Source: Home / Self Care | Attending: Internal Medicine

## 2019-03-08 HISTORY — PX: ORIF TIBIA PLATEAU: SHX2132

## 2019-03-08 HISTORY — PX: CAST APPLICATION: SHX380

## 2019-03-08 LAB — CBC
HCT: 41.2 % (ref 36.0–46.0)
Hemoglobin: 13.7 g/dL (ref 12.0–15.0)
MCH: 29.8 pg (ref 26.0–34.0)
MCHC: 33.3 g/dL (ref 30.0–36.0)
MCV: 89.6 fL (ref 80.0–100.0)
Platelets: 247 10*3/uL (ref 150–400)
RBC: 4.6 MIL/uL (ref 3.87–5.11)
RDW: 13.4 % (ref 11.5–15.5)
WBC: 11.6 10*3/uL — ABNORMAL HIGH (ref 4.0–10.5)
nRBC: 0 % (ref 0.0–0.2)

## 2019-03-08 LAB — CREATININE, SERUM
Creatinine, Ser: 0.55 mg/dL (ref 0.44–1.00)
GFR calc Af Amer: >60 mL/min (ref >60)
GFR calc non Af Amer: >60 mL/min (ref >60)

## 2019-03-08 LAB — HIV ANTIBODY (ROUTINE TESTING W REFLEX): HIV Screen 4th Generation wRfx: NONREACTIVE

## 2019-03-08 SURGERY — OPEN REDUCTION INTERNAL FIXATION (ORIF) TIBIAL PLATEAU
Anesthesia: Spinal | Site: Arm Lower | Laterality: Right

## 2019-03-08 MED ORDER — ENOXAPARIN SODIUM 40 MG/0.4ML ~~LOC~~ SOLN
40.0000 mg | SUBCUTANEOUS | Status: DC
  Administered 2019-03-09 – 2019-03-10 (×2): 40 mg via SUBCUTANEOUS
  Filled 2019-03-08 (×2): qty 0.4

## 2019-03-08 MED ORDER — LIDOCAINE HCL (PF) 2 % IJ SOLN
INTRAMUSCULAR | Status: AC
  Filled 2019-03-08: qty 10

## 2019-03-08 MED ORDER — METOCLOPRAMIDE HCL 10 MG PO TABS: 5.0000 mg | ORAL_TABLET | Freq: Three times a day (TID) | ORAL | Status: DC | PRN

## 2019-03-08 MED ORDER — FENTANYL CITRATE (PF) 100 MCG/2ML IJ SOLN
INTRAMUSCULAR | Status: DC | PRN
  Administered 2019-03-08: 50 ug via INTRAVENOUS

## 2019-03-08 MED ORDER — BISACODYL 10 MG RE SUPP: 10.0000 mg | Freq: Every day | RECTAL | Status: DC | PRN

## 2019-03-08 MED ORDER — CEFAZOLIN SODIUM-DEXTROSE 1-4 GM/50ML-% IV SOLN
INTRAVENOUS | Status: DC | PRN
  Administered 2019-03-08: 1 g via INTRAVENOUS

## 2019-03-08 MED ORDER — CELECOXIB 200 MG PO CAPS: 200.0000 mg | ORAL_CAPSULE | Freq: Two times a day (BID) | ORAL | Status: DC

## 2019-03-08 MED ORDER — MENTHOL 3 MG MT LOZG
1.0000 | LOZENGE | OROMUCOSAL | Status: DC | PRN
  Filled 2019-03-08: qty 9

## 2019-03-08 MED ORDER — BUPIVACAINE HCL (PF) 0.5 % IJ SOLN
INTRAMUSCULAR | Status: AC
  Filled 2019-03-08: qty 10

## 2019-03-08 MED ORDER — MIDAZOLAM HCL 5 MG/5ML IJ SOLN
INTRAMUSCULAR | Status: DC | PRN
  Administered 2019-03-08: 2 mg via INTRAVENOUS

## 2019-03-08 MED ORDER — ALUM & MAG HYDROXIDE-SIMETH 200-200-20 MG/5ML PO SUSP: 30.0000 mL | ORAL | Status: DC | PRN

## 2019-03-08 MED ORDER — ONDANSETRON HCL 4 MG/2ML IJ SOLN: 4.0000 mg | Freq: Four times a day (QID) | INTRAMUSCULAR | Status: DC | PRN

## 2019-03-08 MED ORDER — DEXAMETHASONE SODIUM PHOSPHATE 10 MG/ML IJ SOLN
INTRAMUSCULAR | Status: AC
  Filled 2019-03-08: qty 1

## 2019-03-08 MED ORDER — PHENYLEPHRINE HCL (PRESSORS) 10 MG/ML IV SOLN
INTRAVENOUS | Status: AC
  Filled 2019-03-08: qty 1

## 2019-03-08 MED ORDER — FERROUS SULFATE 325 (65 FE) MG PO TABS
325.0000 mg | ORAL_TABLET | Freq: Three times a day (TID) | ORAL | Status: DC
  Administered 2019-03-09 – 2019-03-11 (×7): 325 mg via ORAL
  Filled 2019-03-08 (×7): qty 1

## 2019-03-08 MED ORDER — ROCURONIUM BROMIDE 50 MG/5ML IV SOLN
INTRAVENOUS | Status: AC
  Filled 2019-03-08: qty 1

## 2019-03-08 MED ORDER — METHOCARBAMOL 1000 MG/10ML IJ SOLN
500.0000 mg | Freq: Four times a day (QID) | INTRAVENOUS | Status: DC | PRN
  Filled 2019-03-08: qty 5

## 2019-03-08 MED ORDER — FLEET ENEMA 7-19 GM/118ML RE ENEM: 1.0000 | ENEMA | Freq: Once | RECTAL | Status: DC | PRN

## 2019-03-08 MED ORDER — FENTANYL CITRATE (PF) 100 MCG/2ML IJ SOLN: 25.0000 ug | INTRAMUSCULAR | Status: DC | PRN

## 2019-03-08 MED ORDER — SODIUM CHLORIDE 0.9 % IV SOLN
INTRAVENOUS | Status: DC | PRN
  Administered 2019-03-08: 08:00:00 30 ug/min via INTRAVENOUS

## 2019-03-08 MED ORDER — DIPHENHYDRAMINE HCL 12.5 MG/5ML PO ELIX: 12.5000 mg | ORAL_SOLUTION | ORAL | Status: DC | PRN

## 2019-03-08 MED ORDER — NEOMYCIN-POLYMYXIN B GU 40-200000 IR SOLN
Status: AC
  Filled 2019-03-08: qty 20

## 2019-03-08 MED ORDER — BUPIVACAINE HCL (PF) 0.5 % IJ SOLN
INTRAMUSCULAR | Status: DC | PRN
  Administered 2019-03-08: 3 mL

## 2019-03-08 MED ORDER — PHENYLEPHRINE HCL (PRESSORS) 10 MG/ML IV SOLN
INTRAVENOUS | Status: DC | PRN
  Administered 2019-03-08: 100 ug via INTRAVENOUS
  Administered 2019-03-08: 200 ug via INTRAVENOUS
  Administered 2019-03-08 (×2): 100 ug via INTRAVENOUS

## 2019-03-08 MED ORDER — EPHEDRINE SULFATE 50 MG/ML IJ SOLN
INTRAMUSCULAR | Status: AC
  Filled 2019-03-08: qty 1

## 2019-03-08 MED ORDER — MORPHINE SULFATE (PF) 2 MG/ML IV SOLN
1.0000 mg | INTRAVENOUS | Status: DC | PRN
  Administered 2019-03-09 (×3): 1 mg via INTRAVENOUS
  Filled 2019-03-08 (×3): qty 1

## 2019-03-08 MED ORDER — CEFAZOLIN SODIUM-DEXTROSE 2-4 GM/100ML-% IV SOLN
2.0000 g | Freq: Three times a day (TID) | INTRAVENOUS | Status: AC
  Administered 2019-03-08 – 2019-03-09 (×3): 2 g via INTRAVENOUS
  Filled 2019-03-08 (×3): qty 100

## 2019-03-08 MED ORDER — SUCCINYLCHOLINE CHLORIDE 20 MG/ML IJ SOLN
INTRAMUSCULAR | Status: AC
  Filled 2019-03-08: qty 1

## 2019-03-08 MED ORDER — ONDANSETRON HCL 4 MG/2ML IJ SOLN
INTRAMUSCULAR | Status: AC
  Filled 2019-03-08: qty 2

## 2019-03-08 MED ORDER — HYDROCODONE-ACETAMINOPHEN 5-325 MG PO TABS
1.0000 | ORAL_TABLET | ORAL | Status: DC | PRN
  Administered 2019-03-08 – 2019-03-11 (×11): 1 via ORAL
  Filled 2019-03-08 (×11): qty 1
  Filled 2019-03-08: qty 2

## 2019-03-08 MED ORDER — METOCLOPRAMIDE HCL 5 MG/ML IJ SOLN: 5.0000 mg | Freq: Three times a day (TID) | INTRAMUSCULAR | Status: DC | PRN

## 2019-03-08 MED ORDER — ONDANSETRON HCL 4 MG/2ML IJ SOLN
INTRAMUSCULAR | Status: DC | PRN
  Administered 2019-03-08: 4 mg via INTRAVENOUS

## 2019-03-08 MED ORDER — FENTANYL CITRATE (PF) 100 MCG/2ML IJ SOLN
INTRAMUSCULAR | Status: AC
  Filled 2019-03-08: qty 2

## 2019-03-08 MED ORDER — CLINDAMYCIN PHOSPHATE 600 MG/50ML IV SOLN
INTRAVENOUS | Status: DC | PRN
  Administered 2019-03-08: 600 mg via INTRAVENOUS

## 2019-03-08 MED ORDER — DEXAMETHASONE SODIUM PHOSPHATE 10 MG/ML IJ SOLN
INTRAMUSCULAR | Status: DC | PRN
  Administered 2019-03-08: 10 mg via INTRAVENOUS

## 2019-03-08 MED ORDER — PHENOL 1.4 % MT LIQD
1.0000 | OROMUCOSAL | Status: DC | PRN
  Filled 2019-03-08: qty 177

## 2019-03-08 MED ORDER — ENOXAPARIN SODIUM 30 MG/0.3ML ~~LOC~~ SOLN: 30.0000 mg | SUBCUTANEOUS | Status: DC

## 2019-03-08 MED ORDER — NEOMYCIN-POLYMYXIN B GU 40-200000 IR SOLN
Status: DC | PRN
  Administered 2019-03-08: 4 mL

## 2019-03-08 MED ORDER — GABAPENTIN 300 MG PO CAPS
300.0000 mg | ORAL_CAPSULE | Freq: Three times a day (TID) | ORAL | Status: DC
  Administered 2019-03-08 – 2019-03-11 (×9): 300 mg via ORAL
  Filled 2019-03-08 (×9): qty 1

## 2019-03-08 MED ORDER — SODIUM CHLORIDE 0.45 % IV SOLN
INTRAVENOUS | Status: DC
  Administered 2019-03-08: 13:00:00 via INTRAVENOUS

## 2019-03-08 MED ORDER — ONDANSETRON HCL 4 MG PO TABS: 4.0000 mg | ORAL_TABLET | Freq: Four times a day (QID) | ORAL | Status: DC | PRN

## 2019-03-08 MED ORDER — MAGNESIUM HYDROXIDE 400 MG/5ML PO SUSP: 30.0000 mL | Freq: Every day | ORAL | Status: DC | PRN

## 2019-03-08 MED ORDER — ACETAMINOPHEN 325 MG PO TABS
325.0000 mg | ORAL_TABLET | Freq: Four times a day (QID) | ORAL | Status: DC | PRN
  Administered 2019-03-10: 650 mg via ORAL
  Filled 2019-03-08: qty 2

## 2019-03-08 MED ORDER — PROPOFOL 500 MG/50ML IV EMUL
INTRAVENOUS | Status: AC
  Filled 2019-03-08: qty 50

## 2019-03-08 MED ORDER — PROPOFOL 10 MG/ML IV BOLUS
INTRAVENOUS | Status: AC
  Filled 2019-03-08: qty 20

## 2019-03-08 MED ORDER — DOCUSATE SODIUM 100 MG PO CAPS
100.0000 mg | ORAL_CAPSULE | Freq: Two times a day (BID) | ORAL | Status: DC
  Administered 2019-03-08: 100 mg via ORAL
  Filled 2019-03-08: qty 1

## 2019-03-08 MED ORDER — ONDANSETRON HCL 4 MG/2ML IJ SOLN: 4.0000 mg | Freq: Once | INTRAMUSCULAR | Status: DC | PRN

## 2019-03-08 MED ORDER — MIDAZOLAM HCL 2 MG/2ML IJ SOLN
INTRAMUSCULAR | Status: AC
  Filled 2019-03-08: qty 2

## 2019-03-08 MED ORDER — METHOCARBAMOL 500 MG PO TABS
500.0000 mg | ORAL_TABLET | Freq: Four times a day (QID) | ORAL | Status: DC | PRN
  Administered 2019-03-11: 500 mg via ORAL
  Filled 2019-03-08: qty 1

## 2019-03-08 MED ORDER — BUPIVACAINE HCL (PF) 0.5 % IJ SOLN
INTRAMUSCULAR | Status: AC
  Filled 2019-03-08: qty 30

## 2019-03-08 MED ORDER — CLINDAMYCIN PHOSPHATE 600 MG/50ML IV SOLN
600.0000 mg | Freq: Three times a day (TID) | INTRAVENOUS | Status: AC
  Administered 2019-03-08 – 2019-03-09 (×2): 600 mg via INTRAVENOUS
  Filled 2019-03-08 (×3): qty 50

## 2019-03-08 MED ORDER — HYDROCODONE-ACETAMINOPHEN 7.5-325 MG PO TABS
1.0000 | ORAL_TABLET | ORAL | Status: DC | PRN
  Administered 2019-03-11: 1 via ORAL
  Filled 2019-03-08: qty 1

## 2019-03-08 MED ORDER — PROPOFOL 500 MG/50ML IV EMUL
INTRAVENOUS | Status: DC | PRN
  Administered 2019-03-08: 75 ug/kg/min via INTRAVENOUS

## 2019-03-08 SURGICAL SUPPLY — 65 items
BIT DRILL 100X2.5XANTM LCK (BIT) ×2 IMPLANT
BIT DRILL 2.5X2.75 QC CALB (BIT) ×3 IMPLANT
BIT DRILL CAL (BIT) ×2 IMPLANT
BIT DRL 100X2.5XANTM LCK (BIT) ×2
BNDG COHESIVE 4X5 TAN STRL (GAUZE/BANDAGES/DRESSINGS) ×3 IMPLANT
BNDG ESMARK 6X12 TAN STRL LF (GAUZE/BANDAGES/DRESSINGS) ×3 IMPLANT
BONE CORTICO CANC 40ML HUMAN T (Bone Implant) ×3 IMPLANT
CANISTER SUCT 1200ML W/VALVE (MISCELLANEOUS) ×3 IMPLANT
CHLORAPREP W/TINT 26 (MISCELLANEOUS) ×6 IMPLANT
COOLER POLAR GLACIER W/PUMP (MISCELLANEOUS) IMPLANT
COVER BACK TABLE REUSABLE LG (DRAPES) ×3 IMPLANT
COVER WAND RF STERILE (DRAPES) ×3 IMPLANT
CRADLE LAMINECT ARM (MISCELLANEOUS) ×3 IMPLANT
CUFF TOURN SGL QUICK 24 (TOURNIQUET CUFF)
CUFF TOURN SGL QUICK 30 (TOURNIQUET CUFF) ×1
CUFF TRNQT CYL 24X4X16.5-23 (TOURNIQUET CUFF) IMPLANT
CUFF TRNQT CYL 30X4X21-28X (TOURNIQUET CUFF) ×2 IMPLANT
DRAPE C-ARM XRAY 36X54 (DRAPES) ×3 IMPLANT
DRAPE C-ARMOR (DRAPES) ×3 IMPLANT
DRAPE INCISE IOBAN 66X45 STRL (DRAPES) ×3 IMPLANT
DRILL BIT 2.5MM (BIT) ×1
DRILL BIT CAL (BIT) ×3
DRSG AQUACEL AG ADV 3.5X10 (GAUZE/BANDAGES/DRESSINGS) IMPLANT
ELECT REM PT RETURN 9FT ADLT (ELECTROSURGICAL) ×3
ELECTRODE REM PT RTRN 9FT ADLT (ELECTROSURGICAL) ×2 IMPLANT
GAUZE SPONGE 4X4 12PLY STRL (GAUZE/BANDAGES/DRESSINGS) ×6 IMPLANT
GAUZE XEROFORM 1X8 LF (GAUZE/BANDAGES/DRESSINGS) ×6 IMPLANT
GLOVE BIO SURGEON STRL SZ8 (GLOVE) ×3 IMPLANT
GLOVE INDICATOR 8.0 STRL GRN (GLOVE) ×3 IMPLANT
GLOVE SURG ORTHO 8.5 STRL (GLOVE) ×6 IMPLANT
GOWN STRL REUS W/ TWL LRG LVL3 (GOWN DISPOSABLE) ×2 IMPLANT
GOWN STRL REUS W/TWL LRG LVL3 (GOWN DISPOSABLE) ×1
GOWN STRL REUS W/TWL LRG LVL4 (GOWN DISPOSABLE) ×12 IMPLANT
K-WIRE ACE 1.6X6 (WIRE) ×3
KIT TURNOVER KIT A (KITS) ×3 IMPLANT
KWIRE ACE 1.6X6 (WIRE) ×2 IMPLANT
NEEDLE SPNL 22GX3.5 QUINCKE BK (NEEDLE) ×6 IMPLANT
NS IRRIG 1000ML POUR BTL (IV SOLUTION) ×3 IMPLANT
PACK EXTREMITY ARMC (MISCELLANEOUS) ×3 IMPLANT
PAD CAST CTTN 4X4 STRL (SOFTGOODS) ×6 IMPLANT
PAD PREP 24X41 OB/GYN DISP (PERSONAL CARE ITEMS) ×3 IMPLANT
PAD WRAPON POLAR KNEE (MISCELLANEOUS) IMPLANT
PADDING CAST COTTON 4X4 STRL (SOFTGOODS) ×3
PLATE LOCK 5H STD RT PROX TIB (Plate) ×3 IMPLANT
SCREW CORTICAL 3.5MM  28MM (Screw) ×2 IMPLANT
SCREW CORTICAL 3.5MM  30MM (Screw) ×1 IMPLANT
SCREW CORTICAL 3.5MM 28MM (Screw) ×4 IMPLANT
SCREW CORTICAL 3.5MM 30MM (Screw) ×2 IMPLANT
SCREW LOCK CORT STAR 3.5X56 (Screw) ×3 IMPLANT
SCREW LOCK CORT STAR 3.5X60 (Screw) ×3 IMPLANT
SCREW LOCK CORT STAR 3.5X65 (Screw) ×9 IMPLANT
SCREW LOCK CORT STAR 3.5X70 (Screw) ×3 IMPLANT
SCREW LP 3.5X60MM (Screw) ×3 IMPLANT
SPONGE LAP 18X18 RF (DISPOSABLE) ×6 IMPLANT
STAPLER SKIN PROX 35W (STAPLE) ×3 IMPLANT
STOCKINETTE BIAS CUT 4 980044 (GAUZE/BANDAGES/DRESSINGS) IMPLANT
STOCKINETTE BIAS CUT 6 980064 (GAUZE/BANDAGES/DRESSINGS) ×3 IMPLANT
STOCKINETTE IMPERVIOUS 9X36 MD (GAUZE/BANDAGES/DRESSINGS) ×3 IMPLANT
SUT QUILL 0 20X36 (SUTURE) ×3 IMPLANT
SUT QUILL PDO 0 36 36 VIOLET (SUTURE) ×3 IMPLANT
SUT VIC AB 2-0 CT1 27 (SUTURE) ×1
SUT VIC AB 2-0 CT1 TAPERPNT 27 (SUTURE) ×2 IMPLANT
SUT VICRYL+ 3-0 36IN CT-1 (SUTURE) ×3 IMPLANT
TAPE TRANSPORE STRL 2 31045 (GAUZE/BANDAGES/DRESSINGS) IMPLANT
WRAPON POLAR PAD KNEE (MISCELLANEOUS)

## 2019-03-08 NOTE — Progress Notes (Signed)
Pharmacy Lovenox Dosing  63 y.o. female admitted with Fall . Patient ordered Lovenox 30 mg daily for VTE prophylaxis.   Filed Weights   03/07/19 0825  Weight: 175 lb (79.4 kg)    Body mass index is 31 kg/m.  Estimated Creatinine Clearance: 73.7 mL/min (by C-G formula based on SCr of 0.55 mg/dL).  Based on CrCl and BMI will adjust Lovenox dosing to 40 mg daily.   Benita Gutter, PharmD Pharmacy Resident 03/08/2019 4:08 PM

## 2019-03-08 NOTE — Progress Notes (Signed)
Glasses returned to patient.

## 2019-03-08 NOTE — Anesthesia Procedure Notes (Signed)
Spinal  Patient location during procedure: OR Start time: 03/08/2019 7:50 AM End time: 03/08/2019 8:01 AM Staffing Anesthesiologist: Molli Barrows, MD Resident/CRNA: Johnna Acosta, CRNA Performed: resident/CRNA  Preanesthetic Checklist Completed: patient identified, site marked, surgical consent, pre-op evaluation, timeout performed, IV checked, risks and benefits discussed and monitors and equipment checked Spinal Block Patient position: sitting Prep: ChloraPrep Patient monitoring: heart rate, continuous pulse ox, blood pressure and cardiac monitor Approach: midline Location: L3-4 Injection technique: single-shot Needle Needle type: Whitacre and Introducer  Needle gauge: 24 G Needle length: 9 cm Assessment Sensory level: T10 Additional Notes Negative paresthesia. Negative blood return. Positive free-flowing CSF. Expiration date of kit checked and confirmed. Patient tolerated procedure well, without complications.

## 2019-03-08 NOTE — Op Note (Signed)
03/08/2019  10:29 AM  PATIENT:  Lindsay Carney    PRE-OPERATIVE DIAGNOSIS:  Right Lateral Tibial Plateau Fracture                                                       Left distal radius fracture  POST-OPERATIVE DIAGNOSIS:  Same  PROCEDURE:  OPEN REDUCTION INTERNAL FIXATION (ORIF) TIBIAL PLATEAU, CAST APPLICATION LEFT WRIST  SURGEON:  Park Breed, MD  ASST: Carlynn Spry, [facility]   ANESTHESIA: Spinal  PREOPERATIVE INDICATIONS:  Lindsay Carney is a  63 y.o. female with a diagnosis of Right Lateral Tibial Plateau Fracture  and elected for surgical management.    The risks benefits and alternatives were discussed with the patient preoperatively including but not limited to the risks of infection, bleeding, nerve injury, cardiopulmonary complications, the need for revision surgery, among others, and the patient was willing to proceed.  OPERATIVE IMPLANTS: Biomet 4-hole proximal tibial plate and screws  OPERATIVE FINDINGS: Central depression right lateral tibial plateau  EBL: 20 cc  TOURNIQUET TIME: 91 MIN  COMPLICATIONS:   None  OPERATIVE PROCEDURE: The patient was brought to the operating room and underwent general LMA anesthesia in the supine position.  The leg was prepped and draped in a sterile fashion.  The left leg was exsanguinated and tourniquet inflated to 350 mmHg.  A hockey-stick-shaped lateral incision was made along the lateral joint line and anterior tibia.  Dissection was carried out sharply through subcutaneous tissue.  The fascia was divided lateral to the anterior cortex of the tibia and followed posteriorly.  Sharp dissection was used to incise the lateral capsule beneath the meniscus.  The meniscus was tagged with 2  0 Ethibond sutures while opening the joint in varus.  The fracture  was seen to be centrally depressed.  The lateral cortex was not violated.  Therefore holes were drilled and a small window made in the lateral cortex in the lateral flare of the plateau.  A  large bone tamp was introduced and tapped gradually up to  the lateral cortex which was then elevated.  Sequential fluoroscopy was used to follow the progress of this.  The lateral plateau was seen to elevate nicely parallel to the medial one.  Following this, cortical cancellus bone chips were introduced into the void to support the plateau.  The bone window was put back in place.  A 4-hole Biomet proximal lateral tibial plate was then placed on the lateral portion of the tibia and pinned in place.  Fluoroscopy showed good positioning.  The proximal holes of the plate were filled with screws of multiple length and the kickstand screws were put in place.  Three bicortical screws were placed distally.  Fluoroscopy showed the construct to be excellent and the lateral plateau to be in good position.  Wounds were irrigated.   The lateral fascia was then repaired with 2-0 Vicryl suture.  Irrigation was carried out at each step.  Subcutaneous tissue was closed with 0 Quill and the skin was closed with staples.  Half percent Marcaine was placed in the wounds.  Dry sterile dressing with Polar Care and knee immobilizer were applied.  The tourniquet had been deflated with good return of blood flow to the foot.  She was awakened and taken recovery in good condition.  Isidore Moos  Sabra Heck, MD

## 2019-03-08 NOTE — Transfer of Care (Signed)
Immediate Anesthesia Transfer of Care Note  Patient: Lindsay Carney  Procedure(s) Performed: OPEN REDUCTION INTERNAL FIXATION (ORIF) TIBIAL PLATEAU (Right ) CAST APPLICATION (Left Arm Lower)  Patient Location: PACU  Anesthesia Type:Spinal  Level of Consciousness: awake, alert  and oriented  Airway & Oxygen Therapy: Patient Spontanous Breathing and Patient connected to face mask oxygen  Post-op Assessment: Report given to RN and Post -op Vital signs reviewed and stable  Post vital signs: Reviewed and stable  Last Vitals:  Vitals Value Taken Time  BP 108/66 03/08/19 1032  Temp 36.4 C 03/08/19 1032  Pulse 65 03/08/19 1033  Resp 19 03/08/19 1033  SpO2 100 % 03/08/19 1033  Vitals shown include unvalidated device data.  Last Pain:  Vitals:   03/08/19 1032  TempSrc:   PainSc: 0-No pain      Patients Stated Pain Goal: 3 (02/77/41 2878)  Complications: No apparent anesthesia complications

## 2019-03-08 NOTE — Anesthesia Post-op Follow-up Note (Signed)
Anesthesia QCDR form completed.        

## 2019-03-08 NOTE — Anesthesia Procedure Notes (Signed)
Date/Time: 03/08/2019 7:50 AM Performed by: Johnna Acosta, CRNA Pre-anesthesia Checklist: Patient identified, Emergency Drugs available, Suction available, Patient being monitored and Timeout performed Patient Re-evaluated:Patient Re-evaluated prior to induction Oxygen Delivery Method: Simple face mask Preoxygenation: Pre-oxygenation with 100% oxygen

## 2019-03-08 NOTE — H&P (Signed)
THE PATIENT WAS SEEN PRIOR TO SURGERY TODAY.  HISTORY, ALLERGIES, HOME MEDICATIONS AND OPERATIVE PROCEDURE WERE REVIEWED. RISKS AND BENEFITS OF SURGERY DISCUSSED WITH PATIENT AGAIN.  NO CHANGES FROM INITIAL HISTORY AND PHYSICAL NOTED.    

## 2019-03-08 NOTE — Progress Notes (Signed)
15 minute call to floor. 

## 2019-03-08 NOTE — Progress Notes (Signed)
Left hand, fingers pink in color, warm to touch, has normal sensation and able to feel staff touching her hand.

## 2019-03-08 NOTE — Anesthesia Preprocedure Evaluation (Signed)
Anesthesia Evaluation  Patient identified by MRN, date of birth, ID band Patient awake    Reviewed: Allergy & Precautions, H&P , NPO status , Patient's Chart, lab work & pertinent test results, reviewed documented beta blocker date and time   History of Anesthesia Complications (+) PONV and history of anesthetic complications  Airway Mallampati: II   Neck ROM: full    Dental  (+) Poor Dentition   Pulmonary neg pulmonary ROS,    Pulmonary exam normal        Cardiovascular Exercise Tolerance: Good hypertension, On Medications negative cardio ROS Normal cardiovascular exam Rhythm:regular Rate:Normal     Neuro/Psych negative neurological ROS  negative psych ROS   GI/Hepatic negative GI ROS, Neg liver ROS, hiatal hernia,   Endo/Other  negative endocrine ROSHypothyroidism   Renal/GU negative Renal ROS  negative genitourinary   Musculoskeletal   Abdominal   Peds  Hematology negative hematology ROS (+) Blood dyscrasia, anemia ,   Anesthesia Other Findings Past Medical History: No date: Anemia     Comment:  age 63 No date: Complication of anesthesia No date: Diffuse cystic mastopathy No date: Hiatal hernia No date: Hyperlipidemia No date: Hypertension No date: Hypothyroidism No date: PONV (postoperative nausea and vomiting)     Comment:  nausea 2011: Thyroid disease Past Surgical History: No date: ABDOMINAL HYSTERECTOMY     Comment:  bladder attached No date: BLADDER SURGERY No date: BREAST CYST ASPIRATION; Right 04/01/10: BREAST SURGERY; Right 2011: COLONOSCOPY 03/12/2017: HEMORRHOID SURGERY; N/A     Comment:  Procedure: INTERNAL AND EXTERNAL HEMORRHOIDECTOMY;                Surgeon: Christene Lye, MD;  Location: [facility] ORS;              Service: General;  Laterality: N/A; No date: HEMORROIDECTOMY     Comment:  lanced 1999: lipoma removal     Comment:  inner knee No date: TOE SURGERY BMI    Body  Mass Index: 31.00 kg/m     Reproductive/Obstetrics negative OB ROS                             Anesthesia Physical Anesthesia Plan  ASA: III  Anesthesia Plan: General and Spinal   Post-op Pain Management:    Induction:   PONV Risk Score and Plan:   Airway Management Planned:   Additional Equipment:   Intra-op Plan:   Post-operative Plan:   Informed Consent: I have reviewed the patients History and Physical, chart, labs and discussed the procedure including the risks, benefits and alternatives for the proposed anesthesia with the patient or authorized representative who has indicated his/her understanding and acceptance.     Dental Advisory Given  Plan Discussed with: CRNA  Anesthesia Plan Comments:         Anesthesia Quick Evaluation

## 2019-03-08 NOTE — Progress Notes (Signed)
[facility] [place] NAME: Lindsay Carney    MR#:  [id number]  DATE OF BIRTH:  10/16/1955  SUBJECTIVE:  CHIEF COMPLAINT:   Chief Complaint  Patient presents with  . Fall  Pain is controlled status post surgery REVIEW OF SYSTEMS:  Review of Systems  Constitutional: Negative for diaphoresis, fever, malaise/fatigue and weight loss.  HENT: Negative for ear discharge, ear pain, hearing loss, nosebleeds, sore throat and tinnitus.   Eyes: Negative for blurred vision and pain.  Respiratory: Negative for cough, hemoptysis, shortness of breath and wheezing.   Cardiovascular: Negative for chest pain, palpitations, orthopnea and leg swelling.  Gastrointestinal: Negative for abdominal pain, blood in stool, constipation, diarrhea, heartburn, nausea and vomiting.  Genitourinary: Negative for dysuria, frequency and urgency.  Musculoskeletal: Positive for falls and joint pain. Negative for back pain and myalgias.  Skin: Negative for itching and rash.  Neurological: Negative for dizziness, tingling, tremors, focal weakness, seizures, weakness and headaches.  Psychiatric/Behavioral: Negative for depression. The patient is not nervous/anxious.     DRUG ALLERGIES:  No Known Allergies VITALS:  Blood pressure 121/66, pulse 79, temperature 98.6 F (37 C), temperature source Oral, resp. rate 19, height 5\' 3"  (1.6 m), weight 79.4 kg, SpO2 97 %. PHYSICAL EXAMINATION:  Physical Exam HENT:     Head: Normocephalic and atraumatic.  Eyes:     Conjunctiva/sclera: Conjunctivae normal.     Pupils: Pupils are equal, round, and reactive to light.  Neck:     Musculoskeletal: Normal range of motion and neck supple.     Thyroid: No thyromegaly.     Trachea: No tracheal deviation.  Cardiovascular:     Rate and Rhythm: Normal rate and regular rhythm.     Heart sounds: Normal heart sounds.  Pulmonary:     Effort: Pulmonary effort is normal. No respiratory distress.    Breath sounds: Normal breath sounds. No wheezing.  Chest:     Chest wall: No tenderness.  Abdominal:     General: Bowel sounds are normal. There is no distension.     Palpations: Abdomen is soft.     Tenderness: There is no abdominal tenderness.  Musculoskeletal: Normal range of motion.     Left lower leg: Edema present.     Comments: Leg status post surgery dressing present Left wrist in cast  Skin:    General: Skin is warm and dry.     Findings: No rash.  Neurological:     Mental Status: She is alert and oriented to person, place, and time.     Cranial Nerves: No cranial nerve deficit.    LABORATORY PANEL:  Female CBC Recent Labs  Lab 03/08/19 1512  WBC 11.6*  HGB 13.7  HCT 41.2  PLT 247   ------------------------------------------------------------------------------------------------------------------ Chemistries  Recent Labs  Lab 03/07/19 1102 03/08/19 1512  NA 140  --   K 3.4*  --   CL 105  --   CO2 24  --   GLUCOSE 103*  --   BUN 12  --   CREATININE 0.39* 0.55  CALCIUM 9.0  --   AST 23  --   ALT 20  --   ALKPHOS 95  --   BILITOT 0.7  --    RADIOLOGY:  Dg Knee 1-2 Views Right  Result Date: 03/08/2019 CLINICAL DATA:  63 year old female status post ORIF right tibial plateau fracture. EXAM: RIGHT KNEE - 1-2 VIEW COMPARISON:  Intraoperative images 0856 hours today. CT of  the right knee 03/07/2019. FINDINGS: Portable AP and cross-table lateral views. Lateral proximal right tibia ORIF hardware is in place. Underlying depressed right lateral tibial plateau fracture. The distal femur and patella appear intact. Preserved alignment at the right knee. Overlying skin staples. IMPRESSION: ORIF of the proximal tibia with no adverse features identified. Electronically Signed   By: Genevie Ann M.D.   On: 03/08/2019 11:25   Dg Tibia/fibula Right  Result Date: 03/08/2019 CLINICAL DATA:  ORIF right tibial plateau fracture EXAM: DG C-ARM 61-120 MIN; RIGHT TIBIA AND FIBULA - 2 VIEW  COMPARISON:  03/07/2019 FINDINGS: Multiple C-arm images were obtained of the right knee. Lateral tibial plateau fixed with lateral plate and multiple screws. Partial reduction of depressed fracture lateral tibial plateau. IMPRESSION: ORIF lateral tibial plateau fracture with lateral plate and screws. Electronically Signed   By: Franchot Gallo M.D.   On: 03/08/2019 10:10   Dg C-arm 1-60 Min  Result Date: 03/08/2019 CLINICAL DATA:  ORIF right tibial plateau fracture EXAM: DG C-ARM 61-120 MIN; RIGHT TIBIA AND FIBULA - 2 VIEW COMPARISON:  03/07/2019 FINDINGS: Multiple C-arm images were obtained of the right knee. Lateral tibial plateau fixed with lateral plate and multiple screws. Partial reduction of depressed fracture lateral tibial plateau. IMPRESSION: ORIF lateral tibial plateau fracture with lateral plate and screws. Electronically Signed   By: Franchot Gallo M.D.   On: 03/08/2019 10:10   ASSESSMENT AND PLAN:   63 y.o. female with pertinent past medical history of hyperlipidemia, hypertension, hypothyroidism, and hiatal hernia presenting to the ED with a left wrist and right knee pain secondary to a slip and fall.  1.  Right tibial fracture -secondary to mechanical fall - s/p ORIF tibial plateau and cast application on left wrist  -  PRN pain management  2.  Left wrist fracture-secondary to mechanical fall - Cast applied on the left wrist  3. HLD - Continue atorvastatin 10mg  PO qhs although patient would like to hold as she does not like alternative to Lipitor which is offered here in the hospital.  4. HTN  - Continue amlodipine  5. Hypothyroidism - continue Synthroid     All the records are reviewed and case discussed with Care Management/Social Worker. Management plans discussed with the patient, family (husband at bedside) and they are in agreement.  CODE STATUS: Full Code  TOTAL TIME TAKING CARE OF THIS PATIENT: 35 minutes.   More than 50% of the time was spent in  counseling/coordination of care: YES  POSSIBLE D/C IN 1-2 DAYS, DEPENDING ON CLINICAL CONDITION.   Max Sane M.D on 03/08/2019 at 5:00 PM  Between 7am to 6pm - Pager - [phone]  After 6pm go to [email or web address] - Proofreader  [facility]  Office  [phone]  CC: Primary care physician; McLean-Scocuzza, Nino Glow, MD  Note: This dictation was prepared with Dragon dictation along with smaller phrase technology. Any transcriptional errors that result from this process are unintentional.

## 2019-03-09 ENCOUNTER — Encounter: Payer: Self-pay | Admitting: Specialist

## 2019-03-09 LAB — CBC
HCT: 34.8 % — ABNORMAL LOW (ref 36.0–46.0)
Hemoglobin: 11.6 g/dL — ABNORMAL LOW (ref 12.0–15.0)
MCH: 29.1 pg (ref 26.0–34.0)
MCHC: 33.3 g/dL (ref 30.0–36.0)
MCV: 87.4 fL (ref 80.0–100.0)
Platelets: 220 10*3/uL (ref 150–400)
RBC: 3.98 MIL/uL (ref 3.87–5.11)
RDW: 13.4 % (ref 11.5–15.5)
WBC: 10.6 10*3/uL — ABNORMAL HIGH (ref 4.0–10.5)
nRBC: 0 % (ref 0.0–0.2)

## 2019-03-09 LAB — BASIC METABOLIC PANEL
Anion gap: 6 (ref 5–15)
BUN: 15 mg/dL (ref 8–23)
CO2: 25 mmol/L (ref 22–32)
Calcium: 7.9 mg/dL — ABNORMAL LOW (ref 8.9–10.3)
Chloride: 108 mmol/L (ref 98–111)
Creatinine, Ser: 0.55 mg/dL (ref 0.44–1.00)
GFR calc Af Amer: >60 mL/min (ref >60)
GFR calc non Af Amer: >60 mL/min (ref >60)
Glucose, Bld: 172 mg/dL — ABNORMAL HIGH (ref 70–99)
Potassium: 3.3 mmol/L — ABNORMAL LOW (ref 3.5–5.1)
Sodium: 139 mmol/L (ref 135–145)

## 2019-03-09 MED ORDER — POTASSIUM CHLORIDE CRYS ER 20 MEQ PO TBCR
40.0000 meq | EXTENDED_RELEASE_TABLET | Freq: Once | ORAL | Status: AC
  Administered 2019-03-09: 40 meq via ORAL
  Filled 2019-03-09: qty 2

## 2019-03-09 MED ORDER — SENNOSIDES-DOCUSATE SODIUM 8.6-50 MG PO TABS
2.0000 | ORAL_TABLET | Freq: Two times a day (BID) | ORAL | Status: DC
  Administered 2019-03-09 – 2019-03-11 (×4): 2 via ORAL
  Filled 2019-03-09 (×4): qty 2

## 2019-03-09 NOTE — Anesthesia Postprocedure Evaluation (Signed)
Anesthesia Post Note  Patient: Lindsay Carney  Procedure(s) Performed: OPEN REDUCTION INTERNAL FIXATION (ORIF) TIBIAL PLATEAU (Right ) CAST APPLICATION (Left Arm Lower)  Patient location during evaluation: Nursing Unit Anesthesia Type: Spinal Level of consciousness: oriented and awake and alert Pain management: pain level controlled Vital Signs Assessment: post-procedure vital signs reviewed and stable Respiratory status: spontaneous breathing and respiratory function stable Cardiovascular status: blood pressure returned to baseline and stable Postop Assessment: no headache, no backache, no apparent nausea or vomiting and patient able to bend at knees Anesthetic complications: no     Last Vitals:  Vitals:   03/08/19 2305 03/09/19 0351  BP: 125/71 126/75  Pulse: 71 62  Resp: 18 16  Temp:  36.7 C  SpO2: 98% 99%    Last Pain:  Vitals:   03/09/19 0743  TempSrc:   PainSc: 6                  Danaysia Rader Lorenza Chick

## 2019-03-09 NOTE — Evaluation (Signed)
Physical Therapy Evaluation Patient Details Name: Lindsay Carney MRN: [id number] DOB: 01-14-1956 Today's Date: 03/09/2019   History of Present Illness  Pt is a 63 y.o. female with pertinent past medical history of hyperlipidemia, hypertension, hypothyroidism, and hiatal hernia presenting to the ED after a mechanical fall at home and was diagnosed with a right lateral tibial plateau fracture and a left distal radius fracture.  Pt is s/p right tibial ORIF and casting to the LUE.    Clinical Impression  Pt presents with deficits in strength, transfers, mobility, gait, balance, and activity tolerance.  Pt required min A with bed mobility tasks for RLE management and cues to prevent the pt from attempting to use her LUE to assist.  Pt required +2 Mod A to stand from an elevated EOB with cues for proper sequencing.  Pt was able to take several small, hop-to steps with a LPFW and min A before becoming fatigued and requiring to return to sitting.  With pt requiring significant physical assistance with functional mobility combined with limited physical assistance available at home the pt would not be safe to return to her prior living situation at this time.  Pt also has multiple steps to enter her home putting the patient and caregiver at risk for injury during home entry and exit.  Pt will benefit from PT services in a SNF setting upon discharge to safely address above deficits for decreased caregiver assistance and eventual return to PLOF.      Follow Up Recommendations SNF    Equipment Recommendations  Other (comment);Wheelchair (measurements PT);Wheelchair cushion (measurements PT)(TBD at next venue of care if pt discharges to a SNF. Pt will require a left platform walker and a w/c with elevating leg rests if not.)    Recommendations for Other Services       Precautions / Restrictions Precautions Precautions: Fall Required Braces or Orthoses: Splint/Cast;Knee Immobilizer - Left Knee Immobilizer -  Left: On at all times(Keep KI on at all times per ortho MD.) Splint/Cast: Hard cast to the LUE Restrictions Weight Bearing Restrictions: Yes LUE Weight Bearing: Non weight bearing RLE Weight Bearing: Non weight bearing      Mobility  Bed Mobility Overal bed mobility: Needs Assistance Bed Mobility: Supine to Sit     Supine to sit: Min assist     General bed mobility comments: Min A for RLE out of bed with cues for general sequencing  Transfers Overall transfer level: Needs assistance Equipment used: Left platform walker Transfers: Sit to/from Stand Sit to Stand: +2 physical assistance;Mod assist;From elevated surface         General transfer comment: Max verbal and tactile cues for sequencing  Ambulation/Gait Ambulation/Gait assistance: +2 safety/equipment;Min assist Gait Distance (Feet): 2 Feet Assistive device: Left platform walker   Gait velocity: decreased   General Gait Details: Hop-to gait with pt fatiguing and requiring to return to sitting after amb 2 feet  Stairs            Wheelchair Mobility    Modified Rankin (Stroke Patients Only)       Balance Overall balance assessment: Needs assistance Sitting-balance support: Single extremity supported Sitting balance-Leahy Scale: Good     Standing balance support: Bilateral upper extremity supported Standing balance-Leahy Scale: Poor Standing balance comment: Heavy lean on the RW with min A for stability in standing  Pertinent Vitals/Pain Pain Assessment: 0-10 Pain Score: 4  Pain Location: RLE Pain Descriptors / Indicators: Aching;Sore Pain Intervention(s): Premedicated before session;Monitored during session    Dane expects to be discharged to:: Private residence Living Arrangements: Spouse/significant other Available Help at Discharge: Family;Available 24 hours/day Type of Home: House Home Access: Stairs to enter Entrance  Stairs-Rails: None(Pt has alternate entry with 7 steps with narrow B rails) Entrance Stairs-Number of Steps: 3 Home Layout: One level Home Equipment: Crutches      Prior Function Level of Independence: Independent         Comments: Pt Ind with amb without AD community distances, no other fall history, slipped on wet floor causing current fall, Ind with ADLs, works Therapist, nutritional Dominance   Dominant Hand: Right    Extremity/Trunk Assessment   Upper Extremity Assessment Upper Extremity Assessment: Generalized weakness;LUE deficits/detail LUE: Unable to fully assess due to pain;Unable to fully assess due to immobilization    Lower Extremity Assessment Lower Extremity Assessment: Generalized weakness;RLE deficits/detail RLE: Unable to fully assess due to pain;Unable to fully assess due to immobilization       Communication   Communication: No difficulties  Cognition Arousal/Alertness: Awake/alert Behavior During Therapy: WFL for tasks assessed/performed Overall Cognitive Status: Within Functional Limits for tasks assessed                                        General Comments      Exercises Total Joint Exercises Ankle Circles/Pumps: AROM;Strengthening;Both;5 reps;10 reps Quad Sets: Strengthening;Both;10 reps Gluteal Sets: Strengthening;Both;10 reps Heel Slides: AROM;Left;10 reps Hip ABduction/ADduction: AROM;Left;10 reps Long Arc Quad: Strengthening;Left;10 reps;15 reps Knee Flexion: Strengthening;Left;10 reps;15 reps Marching in Standing: AROM;Left;10 reps;Seated Other Exercises Other Exercises: HEP education and review for BLE APs, GS, and QS and LLE LAQs x 10 each 5x/day Other Exercises: Sit to/from stand transfer training from multiple height surfaces with extensive verbal and tactile cues for sequencing   Assessment/Plan    PT Assessment Patient needs continued PT services  PT Problem List Decreased strength;Decreased activity  tolerance;Decreased balance;Decreased mobility;Decreased knowledge of use of DME;Decreased knowledge of precautions       PT Treatment Interventions DME instruction;Gait training;Stair training;Functional mobility training;Therapeutic activities;Therapeutic exercise;Balance training;Patient/family education    PT Goals (Current goals can be found in the Care Plan section)  Acute Rehab PT Goals Patient Stated Goal: To be able to get to the bathroom independently PT Goal Formulation: With patient Time For Goal Achievement: 03/22/19 Potential to Achieve Goals: Good    Frequency BID   Barriers to discharge Inaccessible home environment;Decreased caregiver support      Co-evaluation               AM-PAC PT "6 Clicks" Mobility  Outcome Measure Help needed turning from your back to your side while in a flat bed without using bedrails?: A Little Help needed moving from lying on your back to sitting on the side of a flat bed without using bedrails?: A Little Help needed moving to and from a bed to a chair (including a wheelchair)?: A Lot Help needed standing up from a chair using your arms (e.g., wheelchair or bedside chair)?: A Lot Help needed to walk in hospital room?: Total Help needed climbing 3-5 steps with a railing? : Total 6 Click Score: 12    End of Session Equipment Utilized During Treatment:  Gait belt Activity Tolerance: Patient tolerated treatment well Patient left: in chair;with chair alarm set;with call bell/phone within reach;with family/visitor present;with SCD's reapplied Nurse Communication: Mobility status PT Visit Diagnosis: Unsteadiness on feet (R26.81);Muscle weakness (generalized) (M62.81);Other abnormalities of gait and mobility (R26.89)    Time: 8721-5872 PT Time Calculation (min) (ACUTE ONLY): 55 min   Charges:   PT Evaluation $PT Eval Moderate Complexity: 1 Mod PT Treatments $Therapeutic Exercise: 8-22 mins $Therapeutic Activity: 8-22 mins         D. Scott Elwyn Klosinski PT, DPT 03/09/19, 1:21 PM

## 2019-03-09 NOTE — TOC Progression Note (Signed)
Transition of Care [facility]) - Progression Note    Patient Details  Name: Lindsay Carney MRN: [id number] Date of Birth: [birth year]  [facility]) CM/SW Contact  Jearlean Demauro, Lenice Llamas Phone Number: [phone]  03/09/2019, 4:49 PM  Clinical Narrative: Clinical Social Worker (CSW) presented bed offers to patient and her husband. She chose [facility] and is agreeable to pay 2 weeks of co-pays up front, which will be around $3,500. Per [facility] admissions coordinator at [facility] she will start [facility] authorization. Patient reported that she is hoping to progress with PT and get home. CSW provided patient with CMS home health list. Patient prefers [facility] if she goes home. CSW left a Advertising account executive for [employer] home health representative. If patient goes home she will need a wheel chair with elevated leg rests and a platform walker. Patient understands that her insurance will not pay for both a walker and wheel chair. Patient will get the wheel chair through her insurance and will pay privately for the walker. [employer] D[employer] agency representative is aware of above. CSW will continue to follow and assist as needed.      Expected Discharge Plan: [place] Barriers to Discharge: Continued Medical Work up  Expected Discharge Plan and Services Expected Discharge Plan: [place] In-house Referral: Clinical Social Work   Post Acute Care Choice: [place] Living arrangements for the past 2 months: Single Family Home                                       Social Determinants of Health (SDOH) Interventions    Readmission Risk Interventions No flowsheet data found.

## 2019-03-09 NOTE — Progress Notes (Signed)
Physical Therapy Treatment Patient Details Name: Lindsay Carney MRN: [id number] DOB: 01/06/1956 Today's Date: 03/09/2019    History of Present Illness Pt is a 63 y.o. female with pertinent past medical history of hyperlipidemia, hypertension, hypothyroidism, and hiatal hernia presenting to the ED after a mechanical fall at home and was diagnosed with a right lateral tibial plateau fracture and a left distal radius fracture.  Pt is s/p right tibial ORIF and casting to the LUE.    PT Comments    Pt presents with deficits in strength, transfers, mobility, gait, balance, and activity tolerance but made good progress towards goals this session.  Pt required only +1 mod A with transfers with improved eccentric and concentric control as well as confidence.  Pt was able to amb with a hop-to pattern 4' x 2 with min A and a LPFW.  Pt required cues for sequencing with both transfers and gait but less so than during the previous session.  Pt's WB status to her LUE and RLE maintained throughout the session.  Pt will benefit from PT services in a SNF setting upon discharge to safely address above deficits for decreased caregiver assistance and eventual return to PLOF.     Follow Up Recommendations  SNF     Equipment Recommendations  Other (comment);Wheelchair (measurements PT);Wheelchair cushion (measurements PT);3in1 (PT)(Left platform walker) ; TBD at next venue of care if pt discharges to a SNF   Recommendations for Other Services       Precautions / Restrictions Precautions Precautions: Fall Required Braces or Orthoses: Splint/Cast;Knee Immobilizer - Left Knee Immobilizer - Left: On at all times Splint/Cast: Hard cast to the LUE Restrictions Weight Bearing Restrictions: Yes LUE Weight Bearing: Non weight bearing RLE Weight Bearing: Non weight bearing    Mobility  Bed Mobility               General bed mobility comments: NT, pt in recliner  Transfers Overall transfer level: Needs  assistance Equipment used: Left platform walker Transfers: Sit to/from Stand Sit to Stand: Mod assist         General transfer comment: Mod verbal and tactile cues for sequencing  Ambulation/Gait Ambulation/Gait assistance: Min assist Gait Distance (Feet): 4 Feet x 2 Assistive device: Left platform walker   Gait velocity: decreased   General Gait Details: Hop-to gait with mod verbal cues for sequencing and min A to guide the RW   Stairs             Wheelchair Mobility    Modified Rankin (Stroke Patients Only)       Balance Overall balance assessment: Needs assistance Sitting-balance support: Single extremity supported Sitting balance-Leahy Scale: Good     Standing balance support: Bilateral upper extremity supported Standing balance-Leahy Scale: Fair                              Cognition Arousal/Alertness: Awake/alert Behavior During Therapy: WFL for tasks assessed/performed Overall Cognitive Status: Within Functional Limits for tasks assessed                                        Exercises Total Joint Exercises Ankle Circles/Pumps: AROM;Strengthening;Both;5 reps;10 reps Quad Sets: Strengthening;Both;10 reps Gluteal Sets: Strengthening;Both;10 reps Long Arc Quad: Strengthening;Left;10 reps;15 reps Knee Flexion: Strengthening;Left;10 reps;15 reps Marching in Standing: AROM;Left;10 reps;Seated    General Comments  Pertinent Vitals/Pain Pain Assessment: 0-10 Pain Score: 4  Pain Location: RLE Pain Descriptors / Indicators: Aching;Sore Pain Intervention(s): Premedicated before session;Monitored during session    Home Living                      Prior Function            PT Goals (current goals can now be found in the care plan section) Progress towards PT goals: Progressing toward goals    Frequency    BID      PT Plan Current plan remains appropriate    Co-evaluation               AM-PAC PT "6 Clicks" Mobility   Outcome Measure  Help needed turning from your back to your side while in a flat bed without using bedrails?: A Little Help needed moving from lying on your back to sitting on the side of a flat bed without using bedrails?: A Little Help needed moving to and from a bed to a chair (including a wheelchair)?: A Little Help needed standing up from a chair using your arms (e.g., wheelchair or bedside chair)?: A Little Help needed to walk in hospital room?: A Lot Help needed climbing 3-5 steps with a railing? : A Lot 6 Click Score: 16    End of Session Equipment Utilized During Treatment: Gait belt Activity Tolerance: Patient tolerated treatment well Patient left: Other (comment);with call bell/phone within reach(Pt left on [facility], nursing aware) Nurse Communication: Mobility status PT Visit Diagnosis: Unsteadiness on feet (R26.81);Muscle weakness (generalized) (M62.81);Other abnormalities of gait and mobility (R26.89)     Time: 2863-8177 PT Time Calculation (min) (ACUTE ONLY): 28 min  Charges:  $Gait Training: 8-22 mins $Therapeutic Exercise: 8-22 mins                     D. Scott Donat Humble PT, DPT 03/09/19, 5:37 PM

## 2019-03-09 NOTE — NC FL2 (Signed)
[place] LEVEL OF CARE SCREENING TOOL     IDENTIFICATION  Patient Name: Lindsay Carney Birthdate: Nov 11, 1955 Sex: female Admission Date (Current Location): 03/07/2019  [place] and [place] Number:  Engineering geologist and Address:  [facility], [address] [place], [place] [address]      Provider Number: [id number]  Attending Physician Name and Address:  Max Sane, MD  Relative Name and Phone Number:       Current Level of Care: Hospital Recommended Level of Care: [place] Prior Approval Number:    Date Approved/Denied:   PASRR Number: [id number]  Discharge Plan: SNF    Current Diagnoses: Patient Active Problem List   Diagnosis Date Noted  . Right tibial fracture 03/07/2019  . Essential hypertension 09/08/2018  . Elevated liver enzymes 05/21/2018  . Lipoma 05/21/2018  . Low back pain 03/13/2018  . Hyperpigmented toenail lesion 03/13/2018  . Well woman exam without gynecological exam 03/01/2018  . Hypothyroidism 07/24/2014  . HLD (hyperlipidemia) 07/24/2014  . Constipation 07/24/2014  . Hot flashes 07/24/2014  . History of pituitary disease 07/24/2014  . Obesity (BMI 30-39.9) 07/24/2014  . History of fibrocystic disease of breast 03/01/2013    Orientation RESPIRATION BLADDER Height & Weight     Self, Situation, Time, Place  Normal Continent Weight: 175 lb (79.4 kg) Height:  5\' 3"  (160 cm)  BEHAVIORAL SYMPTOMS/MOOD NEUROLOGICAL BOWEL NUTRITION STATUS      Continent Diet(Diet: Regular)  AMBULATORY STATUS COMMUNICATION OF NEEDS Skin   Extensive Assist Verbally Surgical wounds(Incision: Right Leg.)                       Personal Care Assistance Level of Assistance  Bathing, Feeding, Dressing Bathing Assistance: Limited assistance Feeding assistance: Independent Dressing Assistance: Limited assistance     Functional Limitations Info  Sight, Hearing, Speech Sight Info: Impaired Hearing  Info: Adequate Speech Info: Impaired    SPECIAL CARE FACTORS FREQUENCY  PT (By licensed PT), OT (By licensed OT)     PT Frequency: 5 OT Frequency: 5            Contractures      Additional Factors Info  Code Status, Allergies Code Status Info: Full Code. Allergies Info: No Known Allergies.           Current Medications (03/09/2019):  This is the current hospital active medication list Current Facility-Administered Medications  Medication Dose Route Frequency Provider Last Rate Last Dose  . 0.45 % sodium chloride infusion   Intravenous Continuous Earnestine Leys, MD 100 mL/hr at 03/08/19 1804    . 0.9 %  sodium chloride infusion   Intravenous Continuous Earnestine Leys, MD 75 mL/hr at 03/08/19 0600    . acetaminophen (TYLENOL) tablet 325-650 mg  325-650 mg Oral Q6H PRN Earnestine Leys, MD      . alum & mag hydroxide-simeth (MAALOX/MYLANTA) 200-200-20 MG/5ML suspension 30 mL  30 mL Oral Q4H PRN Earnestine Leys, MD      . amLODipine (NORVASC) tablet 5 mg  5 mg Oral Daily Earnestine Leys, MD   5 mg at 03/09/19 0931  . atorvastatin (LIPITOR) tablet 10 mg  10 mg Oral q1800 Earnestine Leys, MD      . bisacodyl (DULCOLAX) suppository 10 mg  10 mg Rectal Daily PRN Earnestine Leys, MD      . cholecalciferol (VITAMIN D) tablet 1,000 Units  1,000 Units Oral Q supper Earnestine Leys, MD   1,000 Units at  03/07/19 1611  . diphenhydrAMINE (BENADRYL) 12.5 MG/5ML elixir 12.5-25 mg  12.5-25 mg Oral Q4H PRN Earnestine Leys, MD      . enoxaparin (LOVENOX) injection 40 mg  40 mg Subcutaneous Q24H Benita Gutter, RPH   40 mg at 03/09/19 0745  . ferrous sulfate tablet 325 mg  325 mg Oral TID Loni Dolly, MD   325 mg at 03/09/19 1308  . fluticasone (FLONASE) 50 MCG/ACT nasal spray 1 spray  1 spray Each Nare Daily Earnestine Leys, MD      . gabapentin (NEURONTIN) capsule 300 mg  300 mg Oral TID Earnestine Leys, MD   300 mg at 03/09/19 0931  . HYDROcodone-acetaminophen (NORCO) 7.5-325 MG per tablet 1-2  tablet  1-2 tablet Oral Q4H PRN Earnestine Leys, MD      . HYDROcodone-acetaminophen (NORCO/VICODIN) 5-325 MG per tablet 1-2 tablet  1-2 tablet Oral Q4H PRN Earnestine Leys, MD   1 tablet at 03/09/19 1435  . levothyroxine (SYNTHROID) tablet 75 mcg  75 mcg Oral Q0600 Earnestine Leys, MD   75 mcg at 03/09/19 0530  . magnesium hydroxide (MILK OF MAGNESIA) suspension 30 mL  30 mL Oral Daily PRN Earnestine Leys, MD      . menthol-cetylpyridinium (CEPACOL) lozenge 3 mg  1 lozenge Oral PRN Earnestine Leys, MD       Or  . phenol (CHLORASEPTIC) mouth spray 1 spray  1 spray Mouth/Throat PRN Earnestine Leys, MD      . methocarbamol (ROBAXIN) tablet 500 mg  500 mg Oral Q6H PRN Earnestine Leys, MD       Or  . methocarbamol (ROBAXIN) 500 mg in dextrose 5 % 50 mL IVPB  500 mg Intravenous Q6H PRN Earnestine Leys, MD      . metoCLOPramide (REGLAN) tablet 5-10 mg  5-10 mg Oral Q8H PRN Earnestine Leys, MD       Or  . metoCLOPramide (REGLAN) injection 5-10 mg  5-10 mg Intravenous Q8H PRN Earnestine Leys, MD      . morphine 2 MG/ML injection 1 mg  1 mg Intravenous Q2H PRN Earnestine Leys, MD   1 mg at 03/09/19 1308  . multivitamin with minerals tablet 1 tablet  1 tablet Oral Q supper Earnestine Leys, MD   1 tablet at 03/07/19 1611  . ondansetron (ZOFRAN) tablet 4 mg  4 mg Oral Q6H PRN Earnestine Leys, MD       Or  . ondansetron [facility]) injection 4 mg  4 mg Intravenous Q6H PRN Earnestine Leys, MD      . polyethylene glycol (MIRALAX / GLYCOLAX) packet 17 g  17 g Oral Daily Earnestine Leys, MD   17 g at 03/09/19 0933  . senna-docusate (Senokot-S) tablet 2 tablet  2 tablet Oral BID Max Sane, MD   2 tablet at 03/09/19 0931  . sodium phosphate (FLEET) 7-19 GM/118ML enema 1 enema  1 enema Rectal Once PRN Earnestine Leys, MD         Discharge Medications: Please see discharge summary for a list of discharge medications.  Relevant Imaging Results:  Relevant Lab Results:   Additional Information SSN: [id number]  Daylan Boggess,  Veronia Beets, LCSW

## 2019-03-09 NOTE — Progress Notes (Signed)
Subjective: 1 Day Post-Op Procedure(s) (LRB): OPEN REDUCTION INTERNAL FIXATION (ORIF) TIBIAL PLATEAU (Right) CAST APPLICATION (Left) Out of bed in a chair today.  Doing well with moderate pain.  Started PT.  Patient reports pain as moderate.  Objective:   VITALS:   Vitals:   03/09/19 0351 03/09/19 0855  BP: 126/75 (!) 142/82  Pulse: 62 83  Resp: 16 18  Temp: 98 F (36.7 C) 98.3 F (36.8 C)  SpO2: 99% 100%    Neurologically intact ABD soft Neurovascular intact Sensation intact distally Intact pulses distally Dorsiflexion/Plantar flexion intact  LABS Recent Labs    03/07/19 1102 03/08/19 1512 03/09/19 0324  HGB 14.7 13.7 11.6*  HCT 43.8 41.2 34.8*  WBC 9.5 11.6* 10.6*  PLT 262 247 220    Recent Labs    03/07/19 1102 03/08/19 1512 03/09/19 0324  NA 140  --  139  K 3.4*  --  3.3*  BUN 12  --  15  CREATININE 0.39* 0.55 0.55  GLUCOSE 103*  --  172*    Recent Labs    03/07/19 1102  INR 1.0     Assessment/Plan: 1 Day Post-Op Procedure(s) (LRB): OPEN REDUCTION INTERNAL FIXATION (ORIF) TIBIAL PLATEAU (Right) CAST APPLICATION (Left)   Advance diet Up with therapy D/C IV fluids Discharge home with home health hopefully by Friday.

## 2019-03-09 NOTE — TOC Initial Note (Signed)
Transition of Care [facility]) - Initial/Assessment Note    Patient Details  Name: Lindsay Carney MRN: [id number] Date of Birth: Aug 16, 1955  Transition of Care [facility]) CM/SW Contact:    Lindsay Carney, Lindsay Carney Phone Number: [phone]  03/09/2019, 2:56 PM  Clinical Narrative: Clinical Social Worker (CSW) received consult to arrange D/C plan. PT is recommending SNF. CSW met with patient and her husband Lindsay Carney was at bedside. Patient was alert and oriented X4 and was sitting up in the chair at bedside. CSW introduced self and explained role of CSW department. Per patient she lives in [place] with her husband. CSW explained SNF process and that Veteran will have to approve SNF. CSW provided SNF list to patient from [email or web address] Patient reported that she will consider SNF and her first choice is [facility] and second choice is [facility]. Patient reported that she would like to go home if she progresses enough with therapy. FL2 complete and faxed out. CSW will continue to follow and assist as needed.                  Expected Discharge Plan: Skilled Nursing Facility Barriers to Discharge: Continued Medical Work up   Patient Goals and CMS Choice Patient states their goals for this hospitalization and ongoing recovery are:: To improve mobility CMS Medicare.gov Compare Post Acute Care list provided to:: Patient Choice offered to / list presented to : Patient  Expected Discharge Plan and Services Expected Discharge Plan: [place] In-house Referral: Clinical Social Work   Post Acute Care Choice: [place] Living arrangements for the past 2 months: [place]                                      Prior Living Arrangements/Services Living arrangements for the past 2 months: Single Family Home Lives with:: Spouse Patient language and need for interpreter reviewed:: No Do you feel safe going back to the place where you live?: Yes      Need for Family  Participation in Patient Care: Yes (Comment) Care giver support system in place?: Yes (comment)   Criminal Activity/Legal Involvement Pertinent to Current Situation/Hospitalization: No - Comment as needed  Activities of Daily Living Home Assistive Devices/Equipment: None ADL Screening (condition at time of admission) Patient's cognitive ability adequate to safely complete daily activities?: Yes Is the patient deaf or have difficulty hearing?: No Does the patient have difficulty seeing, even when wearing glasses/contacts?: No Does the patient have difficulty concentrating, remembering, or making decisions?: No Patient able to express need for assistance with ADLs?: Yes Does the patient have difficulty dressing or bathing?: No Independently performs ADLs?: No Does the patient have difficulty walking or climbing stairs?: Yes Weakness of Legs: Right Weakness of Arms/Hands: Left  Permission Sought/Granted Permission sought to share information with : Chartered certified accountant granted to share information with : Yes, Verbal Permission Granted              Emotional Assessment Appearance:: Appears stated age Attitude/Demeanor/Rapport: Engaged Affect (typically observed): Pleasant, Calm Orientation: : Oriented to Self, Oriented to Place, Oriented to  Time, Oriented to Situation Alcohol / Substance Use: Not Applicable Psych Involvement: No (comment)  Admission diagnosis:  Left wrist fracture, closed, initial encounter [S62.102A] Closed fracture of medial portion of right tibial plateau, initial encounter [S82.131A] Patient Active Problem List   Diagnosis Date Noted  . Right  tibial fracture 03/07/2019  . Essential hypertension 09/08/2018  . Elevated liver enzymes 05/21/2018  . Lipoma 05/21/2018  . Low back pain 03/13/2018  . Hyperpigmented toenail lesion 03/13/2018  . Well woman exam without gynecological exam 03/01/2018  . Hypothyroidism 07/24/2014  . HLD  (hyperlipidemia) 07/24/2014  . Constipation 07/24/2014  . Hot flashes 07/24/2014  . History of pituitary disease 07/24/2014  . Obesity (BMI 30-39.9) 07/24/2014  . History of fibrocystic disease of breast 03/01/2013   PCP:  Carney, Lindsay Glow, MD Pharmacy:   [facility] [place], [address] [place] [address] Phone: [phone] Fax: [phone]  [id number] [place] [address] [address] [id number]: [phone]: [phone]    Social Determinants of Health (SDOH) Interventions    Readmission Risk Interventions No flowsheet data found.

## 2019-03-09 NOTE — Progress Notes (Signed)
[name]    MR#:  [id number]  DATE OF BIRTH:  [birth year]  SUBJECTIVE:  CHIEF COMPLAINT:   Chief Complaint  Patient presents with  . Fall  Pain is controlled, afraid to go home as she is weak and does not feel will be able to get around at home, waiting for physical therapy evaluation REVIEW OF SYSTEMS:  Review of Systems  Constitutional: Negative for diaphoresis, fever, malaise/fatigue and weight loss.  HENT: Negative for ear discharge, ear pain, hearing loss, nosebleeds, sore throat and tinnitus.   Eyes: Negative for blurred vision and pain.  Respiratory: Negative for cough, hemoptysis, shortness of breath and wheezing.   Cardiovascular: Negative for chest pain, palpitations, orthopnea and leg swelling.  Gastrointestinal: Negative for abdominal pain, blood in stool, constipation, diarrhea, heartburn, nausea and vomiting.  Genitourinary: Negative for dysuria, frequency and urgency.  Musculoskeletal: Positive for falls and joint pain. Negative for back pain and myalgias.  Skin: Negative for itching and rash.  Neurological: Negative for dizziness, tingling, tremors, focal weakness, seizures, weakness and headaches.  Psychiatric/Behavioral: Negative for depression. The patient is not nervous/anxious.     DRUG ALLERGIES:  No Known Allergies VITALS:  Blood pressure (!) 142/82, pulse 83, temperature 98.3 F (36.8 C), resp. rate 18, height 5\' 3"  (1.6 m), weight 79.4 kg, SpO2 100 %. PHYSICAL EXAMINATION:  Physical Exam HENT:     Head: Normocephalic and atraumatic.  Eyes:     Conjunctiva/sclera: Conjunctivae normal.     Pupils: Pupils are equal, round, and reactive to light.  Neck:     Musculoskeletal: Normal range of motion and neck supple.     Thyroid: No thyromegaly.     Trachea: No tracheal deviation.  Cardiovascular:     Rate and Rhythm: Normal rate and regular rhythm.     Heart sounds: Normal heart  sounds.  Pulmonary:     Effort: Pulmonary effort is normal. No respiratory distress.     Breath sounds: Normal breath sounds. No wheezing.  Chest:     Chest wall: No tenderness.  Abdominal:     General: Bowel sounds are normal. There is no distension.     Palpations: Abdomen is soft.     Tenderness: There is no abdominal tenderness.  Musculoskeletal: Normal range of motion.     Left lower leg: Edema present.     Comments: Leg status post surgery dressing present Left wrist in cast  Skin:    General: Skin is warm and dry.     Findings: No rash.  Neurological:     Mental Status: She is alert and oriented to person, place, and time.     Cranial Nerves: No cranial nerve deficit.    LABORATORY PANEL:  Female CBC Recent Labs  Lab 03/09/19 0324  WBC 10.6*  HGB 11.6*  HCT 34.8*  PLT 220   ------------------------------------------------------------------------------------------------------------------ Chemistries  Recent Labs  Lab 03/07/19 1102  03/09/19 0324  NA 140  --  139  K 3.4*  --  3.3*  CL 105  --  108  CO2 24  --  25  GLUCOSE 103*  --  172*  BUN 12  --  15  CREATININE 0.39*   < > 0.55  CALCIUM 9.0  --  7.9*  AST 23  --   --   ALT 20  --   --   ALKPHOS 95  --   --   BILITOT 0.7  --   --    < > =  values in this interval not displayed.   RADIOLOGY:  No results found. ASSESSMENT AND PLAN:   63 y.o. female with pertinent past medical history of hyperlipidemia, hypertension, hypothyroidism, and hiatal hernia presenting to the ED with a left wrist and right knee pain secondary to a slip and fall.  1.  Right tibial fracture -secondary to mechanical fall - s/p ORIF tibial plateau and cast application on left wrist on 8/4 -  PRN pain management -Physical therapy recommends rehab.  Social worker aware and is working on placement.  2.  Left wrist fracture-secondary to mechanical fall - Cast applied on the left wrist  3. HLD - Continue atorvastatin 10mg  PO qhs  although patient would like to hold as she does not like alternative to Lipitor which is offered here in the hospital.  4. HTN  - Continue amlodipine  5. Hypothyroidism - continue Synthroid     All the records are reviewed and case discussed with Care Management/Social Worker. Management plans discussed with the patient, nursing, Ortho and they are in agreement.  CODE STATUS: Full Code  TOTAL TIME TAKING CARE OF THIS PATIENT: 35 minutes.   More than 50% of the time was spent in counseling/coordination of care: YES  POSSIBLE D/C IN 1-2 DAYS, DEPENDING ON CLINICAL CONDITION.   Max Sane M.D on 03/09/2019 at 2:00 PM  Between 7am to 6pm - Pager - [phone]  After 6pm go to [email or web address] - Proofreader  [employer] [place] [facility]  Office  [phone]  CC: Primary care physician; McLean-Scocuzza, Nino Glow, MD  Note: This dictation was prepared with Dragon dictation along with smaller phrase technology. Any transcriptional errors that result from this process are unintentional.

## 2019-03-10 LAB — BASIC METABOLIC PANEL
Anion gap: 7 (ref 5–15)
BUN: 12 mg/dL (ref 8–23)
CO2: 25 mmol/L (ref 22–32)
Calcium: 8.4 mg/dL — ABNORMAL LOW (ref 8.9–10.3)
Chloride: 109 mmol/L (ref 98–111)
Creatinine, Ser: 0.52 mg/dL (ref 0.44–1.00)
GFR calc Af Amer: >60 mL/min (ref >60)
GFR calc non Af Amer: >60 mL/min (ref >60)
Glucose, Bld: 107 mg/dL — ABNORMAL HIGH (ref 70–99)
Potassium: 3.3 mmol/L — ABNORMAL LOW (ref 3.5–5.1)
Sodium: 141 mmol/L (ref 135–145)

## 2019-03-10 LAB — CBC
HCT: 35.9 % — ABNORMAL LOW (ref 36.0–46.0)
Hemoglobin: 12.1 g/dL (ref 12.0–15.0)
MCH: 29.5 pg (ref 26.0–34.0)
MCHC: 33.7 g/dL (ref 30.0–36.0)
MCV: 87.6 fL (ref 80.0–100.0)
Platelets: 252 10*3/uL (ref 150–400)
RBC: 4.1 MIL/uL (ref 3.87–5.11)
RDW: 13.8 % (ref 11.5–15.5)
WBC: 9.3 10*3/uL (ref 4.0–10.5)
nRBC: 0 % (ref 0.0–0.2)

## 2019-03-10 MED ORDER — ENOXAPARIN SODIUM 40 MG/0.4ML ~~LOC~~ SOLN
40.0000 mg | SUBCUTANEOUS | Status: DC
  Administered 2019-03-11: 40 mg via SUBCUTANEOUS
  Filled 2019-03-10: qty 0.4

## 2019-03-10 MED ORDER — POTASSIUM CHLORIDE CRYS ER 20 MEQ PO TBCR
30.0000 meq | EXTENDED_RELEASE_TABLET | ORAL | Status: AC
  Administered 2019-03-10 (×2): 30 meq via ORAL
  Filled 2019-03-10 (×2): qty 1

## 2019-03-10 NOTE — Progress Notes (Signed)
Physical Therapy Treatment Patient Details Name: Lindsay Carney MRN: [id number] DOB: [birth year] Today's Date: 03/10/2019    History of Present Illness Pt is a 63 y.o. female with pertinent past medical history of hyperlipidemia, hypertension, hypothyroidism, and hiatal hernia presenting to the ED after a mechanical fall at home and was diagnosed with a right lateral tibial plateau fracture and a left distal radius fracture.  Pt is s/p right tibial ORIF and casting to the LUE.    PT Comments    Pt and spouse agreeable to PT. Pt and spouse educated on exercises, transfers, car transfers, use of polar care, don/doffing immobilizer, short ambulation and safe performance of personal hygiene post toiletting; pt performed and spouse educated/performed assist after therapist to ensure comfort/safety in assist. Pt/spouse feel comfortable with providing care at home with HHPT and aide as well as equipment. Continue PT to progress strength, stability, endurance to improve all functional mobility safely.    Follow Up Recommendations  Home health PT;Supervision/Assistance - 24 hour     Equipment Recommendations  3in1 (PT);Other (comment)(L forearm walker)    Recommendations for Other Services       Precautions / Restrictions Precautions Precautions: Fall Required Braces or Orthoses: Splint/Cast;Knee Immobilizer - Right(cast LUE) Knee Immobilizer - Right: On at all times Splint/Cast: Hard cast to the LUE Restrictions Weight Bearing Restrictions: Yes LUE Weight Bearing: Non weight bearing RLE Weight Bearing: Non weight bearing    Mobility  Bed Mobility Overal bed mobility: Modified Independent Bed Mobility: Supine to Sit     Supine to sit: Modified independent (Device/Increase time)     General bed mobility comments: remains in chair  Transfers Overall transfer level: Needs assistance Equipment used: Left platform walker Transfers: Sit to/from Stand Sit to Stand: Min assist         General transfer comment: Practiced several times from recliner and BSC; also had spouse practice with therapist supervision. Performs well with shoe donned LLE.   Ambulation/Gait Ambulation/Gait assistance: Min assist Gait Distance (Feet): 3 Feet(to from chair/BSC 11ft each and fwd/bkwd 3 ft each) Assistive device: Left platform walker       General Gait Details: Hop to gait; min directional cues and steadying to hop back toward chair   Stairs             Wheelchair Mobility    Modified Rankin (Stroke Patients Only)       Balance Overall balance assessment: Needs assistance Sitting-balance support: Single extremity supported Sitting balance-Leahy Scale: Good     Standing balance support: Bilateral upper extremity supported Standing balance-Leahy Scale: Fair                              Cognition Arousal/Alertness: Awake/alert Behavior During Therapy: WFL for tasks assessed/performed Overall Cognitive Status: Within Functional Limits for tasks assessed                                        Exercises General Exercises - Lower Extremity Ankle Circles/Pumps: AROM;Both;20 reps Quad Sets: Strengthening;Both;10 reps Gluteal Sets: Strengthening;Both;10 reps Hip ABduction/ADduction: Strengthening;Right;5 reps;Standing Straight Leg Raises: Strengthening;Right;5 reps;Standing Other Exercises Other Exercises: Pt/spouse educated on transfers, car transfers, exercises, don/doff immobilizer, polar care use and ice/elevation and updated on home services to be provided.  Other Exercises: managing personal care post toileting from [employer]  Pertinent Vitals/Pain Pain Assessment: 0-10 Pain Score: 4  Pain Location: RLE Pain Descriptors / Indicators: Aching;Sore Pain Intervention(s): Monitored during session;Repositioned;Ice applied;Premedicated before session    Home Living                      Prior Function             PT Goals (current goals can now be found in the care plan section) Progress towards PT goals: Progressing toward goals    Frequency    BID      PT Plan Current plan remains appropriate    Co-evaluation              AM-PAC PT "6 Clicks" Mobility   Outcome Measure  Help needed turning from your back to your side while in a flat bed without using bedrails?: A Little Help needed moving from lying on your back to sitting on the side of a flat bed without using bedrails?: None Help needed moving to and from a bed to a chair (including a wheelchair)?: A Little Help needed standing up from a chair using your arms (e.g., wheelchair or bedside chair)?: A Little Help needed to walk in hospital room?: A Lot Help needed climbing 3-5 steps with a railing? : Total 6 Click Score: 16    End of Session Equipment Utilized During Treatment: Gait belt Activity Tolerance: Patient tolerated treatment well Patient left: in chair;with call bell/phone within reach;with chair alarm set;Other (comment)(polar care in place) Nurse Communication: Other (comment)(SW/CM regarding home care/equipment) PT Visit Diagnosis: Unsteadiness on feet (R26.81);Muscle weakness (generalized) (M62.81);Other abnormalities of gait and mobility (R26.89)     Time: 2542-7062 PT Time Calculation (min) (ACUTE ONLY): 39 min  Charges:  $Gait Training: 8-22 mins $Therapeutic Exercise: 8-22 mins $Therapeutic Activity: 8-22 mins                      Larae Grooms, PTA 03/10/2019, 3:23 PM

## 2019-03-10 NOTE — Progress Notes (Signed)
Subjective: 2 Days Post-Op Procedure(s) (LRB): OPEN REDUCTION INTERNAL FIXATION (ORIF) TIBIAL PLATEAU (Right) CAST APPLICATION (Left) Doing better today.  Less pain.  Made good progress with PT.  Think she is can go home in a day or 2.  Patient reports pain as moderate.  Objective:   VITALS:   Vitals:   03/10/19 1305 03/10/19 1655  BP:  124/69  Pulse: 78 92  Resp:  18  Temp:  98.7 F (37.1 C)  SpO2: 99% 95%    Neurologically intact ABD soft Neurovascular intact Sensation intact distally Intact pulses distally Dorsiflexion/Plantar flexion intact  LABS Recent Labs    03/08/19 1512 03/09/19 0324 03/10/19 0341  HGB 13.7 11.6* 12.1  HCT 41.2 34.8* 35.9*  WBC 11.6* 10.6* 9.3  PLT 247 220 252    Recent Labs    03/08/19 1512 03/09/19 0324 03/10/19 0341  NA  --  139 141  K  --  3.3* 3.3*  BUN  --  15 12  CREATININE 0.55 0.55 0.52  GLUCOSE  --  172* 107*    No results for input(s): LABPT, INR in the last 72 hours.   Assessment/Plan: 2 Days Post-Op Procedure(s) (LRB): OPEN REDUCTION INTERNAL FIXATION (ORIF) TIBIAL PLATEAU (Right) CAST APPLICATION (Left)   Advance diet Up with therapy Discharge home with home health hopefully in 1 to 2 days.  Will need enteric-coated aspirin 325 mg twice daily for 6 to 8 weeks.

## 2019-03-10 NOTE — Progress Notes (Signed)
Pharmacy Electrolyte Monitoring Consult:  Pharmacy consulted to assist in monitoring and replacing electrolytes in this 63 y.o. female admitted on 03/07/2019 with Fall   Labs:  Sodium (mmol/L)  Date Value  03/10/2019 141   Potassium (mmol/L)  Date Value  03/10/2019 3.3 (L)   Calcium (mg/dL)  Date Value  03/10/2019 8.4 (L)   Albumin (g/dL)  Date Value  03/07/2019 4.5    Assessment/Plan: Potassium 43mEq PO Q4hr x 2 doses.   Electrolytes with am labs.   Pharmacy will continue to monitor and adjust per consult.   Simpson,Michael L 03/10/2019 4:17 PM

## 2019-03-10 NOTE — Progress Notes (Signed)
[place] [facility] [place] NAME: Pavielle Biggar    MR#:  [id number]  DATE OF BIRTH:  [birth year]  SUBJECTIVE:  CHIEF COMPLAINT:   Chief Complaint  Patient presents with  . Fall  In some pain, thinking about going to rehab depending on PT evaluation today REVIEW OF SYSTEMS:  Review of Systems  Constitutional: Negative for diaphoresis, fever, malaise/fatigue and weight loss.  HENT: Negative for ear discharge, ear pain, hearing loss, nosebleeds, sore throat and tinnitus.   Eyes: Negative for blurred vision and pain.  Respiratory: Negative for cough, hemoptysis, shortness of breath and wheezing.   Cardiovascular: Negative for chest pain, palpitations, orthopnea and leg swelling.  Gastrointestinal: Negative for abdominal pain, blood in stool, constipation, diarrhea, heartburn, nausea and vomiting.  Genitourinary: Negative for dysuria, frequency and urgency.  Musculoskeletal: Positive for falls and joint pain. Negative for back pain and myalgias.  Skin: Negative for itching and rash.  Neurological: Negative for dizziness, tingling, tremors, focal weakness, seizures, weakness and headaches.  Psychiatric/Behavioral: Negative for depression. The patient is not nervous/anxious.     DRUG ALLERGIES:  No Known Allergies VITALS:  Blood pressure (!) 152/79, pulse 79, temperature 98.8 F (37.1 C), temperature source Oral, resp. rate 18, height 5\' 3"  (1.6 m), weight 79.4 kg, SpO2 100 %. PHYSICAL EXAMINATION:  Physical Exam HENT:     Head: Normocephalic and atraumatic.  Eyes:     Conjunctiva/sclera: Conjunctivae normal.     Pupils: Pupils are equal, round, and reactive to light.  Neck:     Musculoskeletal: Normal range of motion and neck supple.     Thyroid: No thyromegaly.     Trachea: No tracheal deviation.  Cardiovascular:     Rate and Rhythm: Normal rate and regular rhythm.     Heart sounds: Normal heart sounds.  Pulmonary:     Effort: Pulmonary  effort is normal. No respiratory distress.     Breath sounds: Normal breath sounds. No wheezing.  Chest:     Chest wall: No tenderness.  Abdominal:     General: Bowel sounds are normal. There is no distension.     Palpations: Abdomen is soft.     Tenderness: There is no abdominal tenderness.  Musculoskeletal: Normal range of motion.     Left lower leg: Edema present.     Comments: Leg status post surgery dressing present Left wrist in cast  Skin:    General: Skin is warm and dry.     Findings: No rash.  Neurological:     Mental Status: She is alert and oriented to person, place, and time.     Cranial Nerves: No cranial nerve deficit.    LABORATORY PANEL:  Female CBC Recent Labs  Lab 03/10/19 0341  WBC 9.3  HGB 12.1  HCT 35.9*  PLT 252   ------------------------------------------------------------------------------------------------------------------ Chemistries  Recent Labs  Lab 03/07/19 1102  03/10/19 0341  NA 140   < > 141  K 3.4*   < > 3.3*  CL 105   < > 109  CO2 24   < > 25  GLUCOSE 103*   < > 107*  BUN 12   < > 12  CREATININE 0.39*   < > 0.52  CALCIUM 9.0   < > 8.4*  AST 23  --   --   ALT 20  --   --   ALKPHOS 95  --   --   BILITOT 0.7  --   --    < > =  values in this interval not displayed.   RADIOLOGY:  No results found. ASSESSMENT AND PLAN:   63 y.o. female with pertinent past medical history of hyperlipidemia, hypertension, hypothyroidism, and hiatal hernia presenting to the ED with a left wrist and right knee pain secondary to a slip and fall.  1.  Right tibial fracture -secondary to mechanical fall - s/p ORIF tibial plateau and cast application on left wrist on 8/4 -  PRN pain management -Physical therapy recommends rehab.  Social worker aware and is working on placement.  Patient is considering rehab wants to see how she performs with PT today -No bowel movement yet but is passing gas  2.  Left wrist fracture-secondary to mechanical fall -  Cast applied on the left wrist  3. HLD - Continue atorvastatin 10mg  PO qhs although patient would like to hold as she does not like alternative to Lipitor which is offered here in the hospital.  4. HTN  - Continue amlodipine  5. Hypothyroidism - continue Synthroid     All the records are reviewed and case discussed with Care Management/Social Worker. Management plans discussed with the patient, nursing, Ortho and they are in agreement.  CODE STATUS: Full Code  TOTAL TIME TAKING CARE OF THIS PATIENT: 35 minutes.   More than 50% of the time was spent in counseling/coordination of care: YES  POSSIBLE D/C IN 1 DAYS, DEPENDING ON CLINICAL CONDITION.   Max Sane M.D on 03/10/2019 at 11:19 AM  Between 7am to 6pm - Pager - [phone]  After 6pm go to [email or web address] - Proofreader  [facility]  Office  [phone]  CC: Primary care physician; McLean-Scocuzza, Nino Glow, MD  Note: This dictation was prepared with Dragon dictation along with smaller phrase technology. Any transcriptional errors that result from this process are unintentional.

## 2019-03-10 NOTE — Progress Notes (Signed)
PT Cancellation Note  Patient Details Name: Lindsay Carney MRN: [id number] DOB: February 03, 1956   Cancelled Treatment:    Reason Eval/Treat Not Completed: Pain limiting ability to participate. Treatment attempted this morning, pt wishing to defer until pain medication has time to take affect. Re attempt in a little while.    Larae Grooms, PTA 03/10/2019, 12:20 PM

## 2019-03-10 NOTE — Progress Notes (Signed)
Physical Therapy Treatment Patient Details Name: Lindsay Carney MRN: [id number] DOB: [birth year] Today's Date: 03/10/2019    History of Present Illness Pt is a 63 y.o. female with pertinent past medical history of hyperlipidemia, hypertension, hypothyroidism, and hiatal hernia presenting to the ED after a mechanical fall at home and was diagnosed with a right lateral tibial plateau fracture and a left distal radius fracture.  Pt is s/p right tibial ORIF and casting to the LUE.    PT Comments    Pt agreeable to PT; reports 4/10 pain RLE and denies pain in LUE. Pt has many questions regarding home equipment and services and if pt can realistically return home versus skilled nursing facility; also questions regarding activity, ice use, and knee immobilizer. All questions addressed and will follow up in afternoon with answers from SW/CM as needed; all other questions answered to pt satisfaction.  Pt demonstrate bed mobility with Mod I and sequence cues. STS with Min A and short ambulation with Min A for balance/safety particularly with backing up to seat. Pt demonstrating safe mobility with transfers and notes ability to have 24 hour care. Primary barrier to returning home is several steps to enter home; pt's spouse currently working on several solutions; otherwise discussed options of EMS despite possible cost. Recommendation changed to home health with 24 hr supervision. SW/CM to request HHPT and aide; equipment needs will be addressed as well. Continue second session with pt and spouse for continued education regarding immobilizer, car transfers, STS transfers and safe entry/exit of home.   Follow Up Recommendations  Home health PT;Supervision/Assistance - 24 hour     Equipment Recommendations  3in1 (PT);Other (comment)(L forearm walker)    Recommendations for Other Services       Precautions / Restrictions Precautions Precautions: Fall Required Braces or Orthoses: Splint/Cast;Knee Immobilizer  - Right(cast LUE) Knee Immobilizer - Right: On at all times Splint/Cast: Hard cast to the LUE Restrictions Weight Bearing Restrictions: Yes LUE Weight Bearing: Non weight bearing RLE Weight Bearing: Non weight bearing    Mobility  Bed Mobility Overal bed mobility: Modified Independent Bed Mobility: Supine to Sit     Supine to sit: Modified independent (Device/Increase time)     General bed mobility comments: Increased time/use of rail  Transfers Overall transfer level: Needs assistance Equipment used: Left platform walker Transfers: Sit to/from Stand Sit to Stand: Min assist         General transfer comment: Performs well with shoe donned LLE.   Ambulation/Gait Ambulation/Gait assistance: Min assist Gait Distance (Feet): 5 Feet Assistive device: Left platform walker       General Gait Details: Hop to gait; min directional cues and steadying to hop back toward chair   Stairs             Wheelchair Mobility    Modified Rankin (Stroke Patients Only)       Balance Overall balance assessment: Needs assistance Sitting-balance support: Single extremity supported Sitting balance-Leahy Scale: Good     Standing balance support: Bilateral upper extremity supported Standing balance-Leahy Scale: Fair                              Cognition Arousal/Alertness: Awake/alert Behavior During Therapy: WFL for tasks assessed/performed Overall Cognitive Status: Within Functional Limits for tasks assessed  Exercises Other Exercises Other Exercises: Questions answered and education provided for questions regarding stairs to get in home, BSC/toileting management and activity level, don/doff immobilizer and use of ice/elevation Other Exercises: Educated on techniques for car transfer    General Comments        Pertinent Vitals/Pain Pain Assessment: 0-10 Pain Score: 4  Pain Location: RLE Pain  Descriptors / Indicators: Aching;Sore Pain Intervention(s): Monitored during session;Repositioned;Ice applied;Premedicated before session    Home Living                      Prior Function            PT Goals (current goals can now be found in the care plan section) Progress towards PT goals: Progressing toward goals    Frequency    BID      PT Plan Discharge plan needs to be updated    Co-evaluation              AM-PAC PT "6 Clicks" Mobility   Outcome Measure  Help needed turning from your back to your side while in a flat bed without using bedrails?: A Little Help needed moving from lying on your back to sitting on the side of a flat bed without using bedrails?: None Help needed moving to and from a bed to a chair (including a wheelchair)?: A Little Help needed standing up from a chair using your arms (e.g., wheelchair or bedside chair)?: A Little Help needed to walk in hospital room?: A Lot Help needed climbing 3-5 steps with a railing? : Total 6 Click Score: 16    End of Session Equipment Utilized During Treatment: Gait belt Activity Tolerance: Patient tolerated treatment well Patient left: in chair;with call bell/phone within reach;with chair alarm set;Other (comment)(polar care in place) Nurse Communication: Other (comment)(SW/CM regarding home care/equipment) PT Visit Diagnosis: Unsteadiness on feet (R26.81);Muscle weakness (generalized) (M62.81);Other abnormalities of gait and mobility (R26.89)     Time: 7903-8333 PT Time Calculation (min) (ACUTE ONLY): 40 min  Charges:  $Gait Training: 8-22 mins $Therapeutic Activity: 23-37 mins                      Larae Grooms, PTA 03/10/2019, 2:22 PM

## 2019-03-10 NOTE — TOC Progression Note (Signed)
Transition of Care [facility]) - Progression Note    Patient Details  Name: Lindsay Carney MRN: [id number] Date of Birth: 01/31/1956  [facility] CM/SW Contact  Bryah [place], Lenice Llamas Phone Number: [phone]  03/10/2019, 4:41 PM  Clinical Narrative: Clinical Social Worker (CSW) met with patient and her husband at bedside this afternoon. Patient did better with PT today and recommendation is now home health. CSW made patient aware that [facility] can't accept her for home health. Patient is agreeable to [facility]. Helene Kelp [facility] representative is aware of above. Patient reported that she has a wheel chair and a ramp at home. Patient requested a bedside commode and a left platform walker. CSW made [facility] [employer] agency representative aware of above. Patient asked about going home via EMS. CSW explained that she will have a co-pay. Patient is going to call her insurance and ask how much EMS transport will be. CSW will continue to follow and assist as needed.        Expected Discharge Plan: [place] Barriers to Discharge: Continued Medical Work up  Expected Discharge Plan and Services Expected Discharge Plan: [place] In-house Referral: Clinical Social Work   Post Acute Care Choice: [place] Living arrangements for the past 2 months: Single Family Home                                       Social Determinants of Health (SDOH) Interventions    Readmission Risk Interventions No flowsheet data found.

## 2019-03-11 ENCOUNTER — Encounter: Payer: BC Managed Care – PPO | Admitting: Internal Medicine

## 2019-03-11 ENCOUNTER — Telehealth: Payer: Self-pay | Admitting: Internal Medicine

## 2019-03-11 LAB — CBC
HCT: 36.6 % (ref 36.0–46.0)
Hemoglobin: 12.2 g/dL (ref 12.0–15.0)
MCH: 29.6 pg (ref 26.0–34.0)
MCHC: 33.3 g/dL (ref 30.0–36.0)
MCV: 88.8 fL (ref 80.0–100.0)
Platelets: 261 10*3/uL (ref 150–400)
RBC: 4.12 MIL/uL (ref 3.87–5.11)
RDW: 13.5 % (ref 11.5–15.5)
WBC: 7.5 10*3/uL (ref 4.0–10.5)
nRBC: 0 % (ref 0.0–0.2)

## 2019-03-11 LAB — BASIC METABOLIC PANEL
Anion gap: 8 (ref 5–15)
BUN: 10 mg/dL (ref 8–23)
CO2: 27 mmol/L (ref 22–32)
Calcium: 8.3 mg/dL — ABNORMAL LOW (ref 8.9–10.3)
Chloride: 105 mmol/L (ref 98–111)
Creatinine, Ser: 0.43 mg/dL — ABNORMAL LOW (ref 0.44–1.00)
GFR calc Af Amer: >60 mL/min (ref >60)
GFR calc non Af Amer: >60 mL/min (ref >60)
Glucose, Bld: 108 mg/dL — ABNORMAL HIGH (ref 70–99)
Potassium: 3.7 mmol/L (ref 3.5–5.1)
Sodium: 140 mmol/L (ref 135–145)

## 2019-03-11 LAB — MAGNESIUM: Magnesium: 2.4 mg/dL (ref 1.7–2.4)

## 2019-03-11 MED ORDER — IBUPROFEN 600 MG PO TABS: 600.0000 mg | ORAL_TABLET | Freq: Three times a day (TID) | ORAL | 0 refills | Status: DC | PRN

## 2019-03-11 MED ORDER — ASPIRIN EC 325 MG PO TBEC: 325.0000 mg | DELAYED_RELEASE_TABLET | Freq: Two times a day (BID) | ORAL | 0 refills | Status: AC

## 2019-03-11 NOTE — Discharge Summary (Signed)
[facility] NAME: Lindsay Carney    MR#:  [id number]  DATE OF BIRTH:  09/22/1955  DATE OF ADMISSION:  03/07/2019   ADMITTING PHYSICIAN: Lang Snow, NP  DATE OF DISCHARGE: 03/11/2019  1:44 PM  PRIMARY CARE PHYSICIAN: McLean-Scocuzza, Nino Glow, MD   ADMISSION DIAGNOSIS:  Left wrist fracture, closed, initial encounter [S62.102A] Closed fracture of medial portion of right tibial plateau, initial encounter [S82.131A] DISCHARGE DIAGNOSIS:  Active Problems:   Right tibial fracture  SECONDARY DIAGNOSIS:   Past Medical History:  Diagnosis Date   Anemia    age 15   Complication of anesthesia    Diffuse cystic mastopathy    Hiatal hernia    Hyperlipidemia    Hypertension    Hypothyroidism    PONV (postoperative nausea and vomiting)    nausea   Thyroid disease 2011   HOSPITAL COURSE:   63 y.o.femalewith pertinent past medical history ofhyperlipidemia, hypertension, hypothyroidism, and hiatal hernia presenting to the ED with a left wrist and right knee pain secondary to a slip and fall.  1.Right tibial fracture -secondary to mechanical fall - s/p ORIF tibial plateau and cast application on left wrist on 8/4 -PRN pain management -Physical therapy recommends rehab.  Social worker aware and is working on placement.  Patient is considering rehab wants to see how she performs with PT today -No bowel movement yet but is passing gas  2.Left wrist fracture-secondary to mechanical fall -Cast applied on the left wrist  3.HLD -Continue statin  4.HTN -Continue amlodipine  5.Hypothyroidism-continue Synthroid  DISCHARGE CONDITIONS:  stable CONSULTS OBTAINED:  Treatment Team:  Earnestine Leys, MD DRUG ALLERGIES:  No Known Allergies DISCHARGE MEDICATIONS:   Allergies as of 03/11/2019   No Known Allergies     Medication List    STOP taking these medications   aspirin 81 MG  tablet Replaced by: aspirin EC 325 MG tablet     TAKE these medications   amLODipine 5 MG tablet Commonly known as: NORVASC Take 1 tablet (5 mg total) by mouth daily.   aspirin EC 325 MG tablet Take 1 tablet (325 mg total) by mouth 2 (two) times daily. Replaces: aspirin 81 MG tablet   atorvastatin 10 MG tablet Commonly known as: Lipitor Take 1 tablet (10 mg total) by mouth daily at 6 PM.   cholecalciferol 1000 units tablet Commonly known as: VITAMIN D Take 1,000 Units by mouth daily with supper.   Fish Oil 1200 MG Caps Take 1,200 mg by mouth daily with supper.   ibuprofen 600 MG tablet Commonly known as: ADVIL Take 1 tablet (600 mg total) by mouth every 8 (eight) hours as needed for mild pain or moderate pain.   levothyroxine 75 MCG tablet Commonly known as: SYNTHROID TAKE 1 TABLET BY MOUTH ON EMPTY STOMACH 30-60 MIN BEFORE BREAKFAST   meloxicam 15 MG tablet Commonly known as: MOBIC Take 15 mg by mouth daily as needed for pain.   MiraLax 17 GM/SCOOP powder Generic drug: polyethylene glycol powder Take 1 Container by mouth once.   NASACORT AQ NA Place 1 spray into the nose at bedtime.   One-A-Day 50 Plus Tabs Take 1 tablet by mouth daily with supper.      DISCHARGE INSTRUCTIONS:   DIET:  Regular diet DISCHARGE CONDITION:  Good ACTIVITY:  Activity as tolerated Precautions: Fall Knee Immobilizer - Right: On at all times Splint/Cast: Hard cast to the LUE Restrictions Weight Bearing Restrictions: Yes LUE  Weight Bearing: Non weight bearing RLE Weight Bearing: Non weight bearing  OXYGEN:  Home Oxygen: No.  Oxygen Delivery: room air DISCHARGE LOCATION:  home with HHPT  If you experience worsening of your admission symptoms, develop shortness of breath, life threatening emergency, suicidal or homicidal thoughts you must seek medical attention immediately by calling 911 or calling your MD immediately  if symptoms less severe.  You Must read complete  instructions/literature along with all the possible adverse reactions/side effects for all the Medicines you take and that have been prescribed to you. Take any new Medicines after you have completely understood and accpet all the possible adverse reactions/side effects.   Please note  You were cared for by a hospitalist during your hospital stay. If you have any questions about your discharge medications or the care you received while you were in the hospital after you are discharged, you can call the unit and asked to speak with the hospitalist on call if the hospitalist that took care of you is not available. Once you are discharged, your primary care physician will handle any further medical issues. Please note that NO REFILLS for any discharge medications will be authorized once you are discharged, as it is imperative that you return to your primary care physician (or establish a relationship with a primary care physician if you do not have one) for your aftercare needs so that they can reassess your need for medications and monitor your lab values.    On the day of Discharge:  VITAL SIGNS:  Blood pressure 134/77, pulse 86, temperature 99.2 F (37.3 C), temperature source Oral, resp. rate 18, height 5\' 3"  (1.6 m), weight 79.4 kg, SpO2 99 %. PHYSICAL EXAMINATION:  GENERAL:  63 y.o.-year-old patient lying in the bed with no acute distress.  EYES: Pupils equal, round, reactive to light and accommodation. No scleral icterus. Extraocular muscles intact.  HEENT: Head atraumatic, normocephalic. Oropharynx and nasopharynx clear.  NECK:  Supple, no jugular venous distention. No thyroid enlargement, no tenderness.  LUNGS: Normal breath sounds bilaterally, no wheezing, rales,rhonchi or crepitation. No use of accessory muscles of respiration.  CARDIOVASCULAR: S1, S2 normal. No murmurs, rubs, or gallops.  ABDOMEN: Soft, non-tender, non-distended. Bowel sounds present. No organomegaly or mass.    EXTREMITIES: No pedal edema, cyanosis, or clubbing.  NEUROLOGIC: Cranial nerves II through XII are intact. Muscle strength 5/5 in all extremities. Sensation intact. Gait not checked.  PSYCHIATRIC: The patient is alert and oriented x 3.  SKIN: No obvious rash, lesion, or ulcer.  DATA REVIEW:   CBC Recent Labs  Lab 03/11/19 0333  WBC 7.5  HGB 12.2  HCT 36.6  PLT 261    Chemistries  Recent Labs  Lab 03/07/19 1102  03/11/19 0333  NA 140   < > 140  K 3.4*   < > 3.7  CL 105   < > 105  CO2 24   < > 27  GLUCOSE 103*   < > 108*  BUN 12   < > 10  CREATININE 0.39*   < > 0.43*  CALCIUM 9.0   < > 8.3*  MG  --   --  2.4  AST 23  --   --   ALT 20  --   --   ALKPHOS 95  --   --   BILITOT 0.7  --   --    < > = values in this interval not displayed.     Follow-up Information  McLean-Scocuzza, Nino Glow, MD. Schedule an appointment as soon as possible for a visit in 5 days.   Specialty: Internal Medicine Why: Per Office; Patient to call upon Return to Home for Appt Contact information: [address] [place] [address] [phone]        Earnestine Leys, MD. Schedule an appointment as soon as possible for a visit on 03/21/2019.   Specialty: Orthopedic Surgery Why: @ 2:00 pm Contact information: [place] [address] [phone]           Management plans discussed with the patient, nursing and they are in agreement.  CODE STATUS: Prior   TOTAL TIME TAKING CARE OF THIS PATIENT: 45 minutes.    Max Sane M.D on 03/11/2019 at 6:17 PM  Between 7am to 6pm - Pager - [phone]  After 6pm go to [email or web address] - Proofreader  [facility] [place] [facility]  Office  [phone]  CC: Primary care physician; McLean-Scocuzza, Nino Glow, MD   Note: This dictation was prepared with Dragon dictation along with smaller phrase technology. Any transcriptional errors that result from this process are unintentional.

## 2019-03-11 NOTE — TOC Transition Note (Signed)
Transition of Care [facility]) - CM/SW Discharge Note   Patient Details  Name: Lindsay Carney MRN: [id number] Date of Birth: March 29, 1956  Transition of Care [facility]) CM/SW Contact:  Pamala Hayman, Lenice Llamas Phone Number: [phone]  03/11/2019, 1:31 PM   Clinical Narrative: Patient will D/C home today via EMS. Clinical Education officer, museum (CSW) completed EMS form and notified Helene Kelp [facility] home health representative that patient will D/C home today. CSW delivered patient's rolling walker with platform attachment and bedside commode. PT will adjust walker to patient's height. [facility] [employer] D[employer] agency representative is aware of above. CSW notified [facility] that patient will D/C home today. Please reconsult if future social work needs arise. CSW signing off.        Final next level of care: [place] Barriers to Discharge: Barriers Resolved   Patient Goals and CMS Choice Patient states their goals for this hospitalization and ongoing recovery are:: To improve mobility CMS Medicare.gov Compare Post Acute Care list provided to:: Patient Choice offered to / list presented to : Patient  Discharge Placement                Patient to be transferred to facility by: [facility]      Discharge Plan and Services In-house Referral: Clinical Social Work   Post Acute Care Choice: [place]          DME Arranged: Bedside commode, Science writer, Geophysicist/field seismologist DME Agency: [employer] Date DME Agency Contacted: 03/11/19 Time DME Agency Contacted: 0930 Representative spoke with at DME Agency: [place] Arranged: PT, OT, Nurse's Aide [place] Agency: [facility] [employer] [facility] Date [place]: 03/18/19 Time [place]: 1030 Representative spoke with at [place]: [place] ([place]) Interventions     Readmission Risk Interventions No flowsheet data found.

## 2019-03-11 NOTE — Progress Notes (Signed)
Physical Therapy Treatment Patient Details Name: Lindsay Carney MRN: [id number] DOB: 11/22/1955 Today's Date: 03/11/2019    History of Present Illness Pt is a 63 y.o. female with pertinent past medical history of hyperlipidemia, hypertension, hypothyroidism, and hiatal hernia presenting to the ED after a mechanical fall at home and was diagnosed with a right lateral tibial plateau fracture and a left distal radius fracture.  Pt is s/p right tibial ORIF and casting to the LUE.    PT Comments    Pt agreeable to PT; pain RLE 4/10 and denies in LUE. Pt demonstrating improved confidence and ease with STS transfers from bed, BSC and recliner. Pt performing with Min guard for safety. Bed mobility Mod I. Pt received L platform rolling walker, which was fitted to appropriate heights and comfort and all adjustments secured. Spouse/pt educated on use and adjustments. Performed and reviewed exercises. Pt prepared to discharge home with home health PT and aide. Brief discussion per pt inquiry on therapy post discharge and outpt; encouraged participation and educated on [facility] services, as well as any opportunity pt may have to aquatic PT/exercise.    Follow Up Recommendations  Home health PT;Supervision/Assistance - 24 hour     Equipment Recommendations  Other (comment)(received equipment)    Recommendations for Other Services       Precautions / Restrictions Precautions Precautions: Fall Knee Immobilizer - Right: On at all times Splint/Cast: Hard cast to the LUE Restrictions Weight Bearing Restrictions: Yes LUE Weight Bearing: Non weight bearing RLE Weight Bearing: Non weight bearing    Mobility  Bed Mobility Overal bed mobility: Modified Independent Bed Mobility: Supine to Sit     Supine to sit: Modified independent (Device/Increase time)     General bed mobility comments: Mild increased time  Transfers Overall transfer level: Needs assistance Equipment used: Left platform  walker Transfers: Sit to/from Stand Sit to Stand: Min guard         General transfer comment: Improving stand in technique, confidence and steadiness. Performed multiple times from various surface   Ambulation/Gait Ambulation/Gait assistance: Min guard Gait Distance (Feet): 5 Feet(other 2-3 ft for bed to [facility]) Assistive device: Left platform walker       General Gait Details: L platform walker fitted to patient comfort and appropriate height and tightened securely. Spouse and pt educated. Hop to with small stable hops   Stairs             Wheelchair Mobility    Modified Rankin (Stroke Patients Only)       Balance Overall balance assessment: Needs assistance Sitting-balance support: Single extremity supported Sitting balance-Leahy Scale: Good     Standing balance support: Bilateral upper extremity supported Standing balance-Leahy Scale: Fair Standing balance comment: Heavy lean on rw. Gaining confidence; requires Min guard for safety                            Cognition Arousal/Alertness: Awake/alert Behavior During Therapy: WFL for tasks assessed/performed Overall Cognitive Status: Within Functional Limits for tasks assessed                                        Exercises General Exercises - Lower Extremity Quad Sets: Strengthening;20 reps Long Arc Quad: Strengthening;Left;20 reps;Other (comment)(educated in carryover at home with light ankle wt/eccentric) Hip ABduction/ADduction: Strengthening;Right;10 reps(long sit; reviewed in stand) Straight Leg Raises: Strengthening;Right;10  reps(long sit; review in stand) Other Exercises Other Exercises: Min guard A and set up for toileting/personal hygiene    General Comments        Pertinent Vitals/Pain Pain Assessment: 0-10 Pain Score: 4  Pain Location: RLE Pain Descriptors / Indicators: Discomfort Pain Intervention(s): Monitored during session;Premedicated before  session;Repositioned;Ice applied    Home Living                      Prior Function            PT Goals (current goals can now be found in the care plan section) Progress towards PT goals: Progressing toward goals    Frequency    BID      PT Plan Current plan remains appropriate    Co-evaluation              AM-PAC PT "6 Clicks" Mobility   Outcome Measure  Help needed turning from your back to your side while in a flat bed without using bedrails?: A Little Help needed moving from lying on your back to sitting on the side of a flat bed without using bedrails?: None Help needed moving to and from a bed to a chair (including a wheelchair)?: A Little Help needed standing up from a chair using your arms (e.g., wheelchair or bedside chair)?: A Little Help needed to walk in hospital room?: A Lot Help needed climbing 3-5 steps with a railing? : Total 6 Click Score: 16    End of Session Equipment Utilized During Treatment: Gait belt Activity Tolerance: Patient tolerated treatment well Patient left: in chair;with call bell/phone within reach;with family/visitor present;Other (comment)(polar care)   PT Visit Diagnosis: Unsteadiness on feet (R26.81);Muscle weakness (generalized) (M62.81);Other abnormalities of gait and mobility (R26.89)     Time: 1110-1144 PT Time Calculation (min) (ACUTE ONLY): 34 min  Charges:  $Gait Training: 8-22 mins $Therapeutic Exercise: 8-22 mins                      Larae Grooms, PTA 03/11/2019, 12:31 PM

## 2019-03-11 NOTE — Telephone Encounter (Signed)
Will follow for discharge from [facility] for TCM and FU with PCP.

## 2019-03-11 NOTE — Progress Notes (Signed)
Patient is to discharge to home today. Discharge instructions to be reviewed prior to d/c. All personal belongings with patient. DME with patient.  No distress noted.  Immobilizer in place to right leg, Cast to left arm.

## 2019-03-11 NOTE — Discharge Instructions (Signed)
Cast or Splint Care, Adult  Casts and splints are supports that are worn to protect broken bones and other injuries. A cast or splint may hold a bone still and in the correct position while it heals. Casts and splints may also help to ease pain, swelling, and muscle spasms.  How to care for your cast    · Do not stick anything inside the cast to scratch your skin.  · Check the skin around the cast every day. Tell your doctor about any concerns.  · You may put lotion on dry skin around the edges of the cast. Do not put lotion on the skin under the cast.  · Keep the cast clean.  · If the cast is not waterproof:  ? Do not let it get wet.  ? Cover it with a watertight covering when you take a bath or a shower.  How to care for your splint    · Wear it as told by your doctor. Take it off only as told by your doctor.  · Loosen the splint if your fingers or toes tingle, get numb, or turn cold and blue.  · Keep the splint clean.  · If the splint is not waterproof:  ? Do not let it get wet.  ? Cover it with a watertight covering when you take a bath or a shower.  Follow these instructions at home:  Bathing  · Do not take baths or swim until your doctor says it is okay. Ask your doctor if you can take showers. You may only be allowed to take sponge baths for bathing.  · If your cast or splint is not waterproof, cover it with a watertight covering when you take a bath or shower.  Managing pain, stiffness, and swelling  · Move your fingers or toes often to avoid stiffness and to lessen swelling.  · Raise (elevate) the injured area above the level of your heart while sitting or lying down.  Safety  · Do not use the injured limb to support your body weight until your doctor says that it is okay.  · Use crutches or other assistive devices as told by your doctor.  General instructions  · Do not put pressure on any part of the cast or splint until it is fully hardened. This may take many hours.  · Return to your normal activities as  told by your doctor. Ask your doctor what activities are safe for you.  · Keep all follow-up visits as told by your doctor. This is important.  Contact a doctor if:  · Your cast or splint gets damaged.  · The skin around the cast gets red or raw.  · The skin under the cast is very itchy or painful.  · Your cast or splint feels very uncomfortable.  · Your cast or splint is too tight or too loose.  · Your cast becomes wet or it starts to have a soft spot or area.  · You get an object stuck under your cast.  Get help right away if:  · Your pain gets worse.  · The injured area tingles, gets numb, or turns blue and cold.  · The part of your body above or below the cast is swollen and it turns a different color (is discolored).  · You cannot feel or move your fingers or toes.  · There is fluid leaking through the cast.  · You have very bad pain or pressure under the cast.  ·   11/20/2010 Document Revised: 11/10/2018 Document Reviewed: 07/11/2016 Elsevier Patient Education  [place].

## 2019-03-11 NOTE — Progress Notes (Signed)
Patient expressed wish to speak with Dr. Sabra Heck before discharging from unit.

## 2019-03-11 NOTE — Telephone Encounter (Signed)
FYI Pt is being discharged from [facility] today to a facility [facility]/ Dx surgery left wrist was on 03/08/2019. Thank you!

## 2019-03-11 NOTE — Progress Notes (Signed)
[facility] transport notified for transport to home.  Dr. Sabra Heck in. Dressing changed to right leg.

## 2019-03-11 NOTE — Progress Notes (Signed)
Pharmacy Electrolyte Monitoring Consult:  Pharmacy consulted to assist in monitoring and replacing electrolytes in this 63 y.o. female admitted on 03/07/2019 with Fall   Labs:  Sodium (mmol/L)  Date Value  03/11/2019 140   Potassium (mmol/L)  Date Value  03/11/2019 3.7   Magnesium (mg/dL)  Date Value  03/11/2019 2.4   Calcium (mg/dL)  Date Value  03/11/2019 8.3 (L)   Albumin (g/dL)  Date Value  03/07/2019 4.5    Assessment/Plan: Electrolytes on AM labs indicate potassium is re-pleted and magnesium is within normal limits. Pharmacy will defer to primary team for ordering labs.   Pharmacy will continue to monitor and adjust per consult.   Benita Gutter 03/11/2019 1:39 PM

## 2019-03-11 NOTE — Progress Notes (Signed)
Subjective: 3 Days Post-Op Procedure(s) (LRB): OPEN REDUCTION INTERNAL FIXATION (ORIF) TIBIAL PLATEAU (Right) CAST APPLICATION (Left) Doing better today.  Ready to go home with home health PT.  Dressings changed.  Wounds benign.  Honeycomb dressing and Ace bandage applied.  We will see her back in the office in 10 days.  Patient reports pain as mild.  Objective:   VITALS:   Vitals:   03/11/19 1002 03/11/19 1110  BP:    Pulse:  86  Resp:    Temp:    SpO2: 99% 99%    Neurologically intact ABD soft Neurovascular intact Sensation intact distally Intact pulses distally Dorsiflexion/Plantar flexion intact Incision: no drainage  LABS Recent Labs    03/09/19 0324 03/10/19 0341 03/11/19 0333  HGB 11.6* 12.1 12.2  HCT 34.8* 35.9* 36.6  WBC 10.6* 9.3 7.5  PLT 220 252 261    Recent Labs    03/09/19 0324 03/10/19 0341 03/11/19 0333  NA 139 141 140  K 3.3* 3.3* 3.7  BUN 15 12 10   CREATININE 0.55 0.52 0.43*  GLUCOSE 172* 107* 108*    No results for input(s): LABPT, INR in the last 72 hours.   Assessment/Plan: 3 Days Post-Op Procedure(s) (LRB): OPEN REDUCTION INTERNAL FIXATION (ORIF) TIBIAL PLATEAU (Right) CAST APPLICATION (Left)   Discharge home with home health   Return to clinic in 10 days  Nonweightbearing right leg  Enteric-coated aspirin 325 mg twice daily for 6-week

## 2019-03-13 DIAGNOSIS — Z9181 History of falling: Secondary | ICD-10-CM | POA: Diagnosis not present

## 2019-03-13 DIAGNOSIS — S82141D Displaced bicondylar fracture of right tibia, subsequent encounter for closed fracture with routine healing: Secondary | ICD-10-CM | POA: Diagnosis not present

## 2019-03-13 DIAGNOSIS — S62102D Fracture of unspecified carpal bone, left wrist, subsequent encounter for fracture with routine healing: Secondary | ICD-10-CM | POA: Diagnosis not present

## 2019-03-14 ENCOUNTER — Ambulatory Visit: Payer: Self-pay

## 2019-03-14 NOTE — Telephone Encounter (Signed)
Provided lab results of Labs 8/4/ 55 opf  Dr.  Morrison Old -Scocuzza Patient voiced understanding.  Inquired about. Mammogram results not available.

## 2019-03-15 DIAGNOSIS — S62102D Fracture of unspecified carpal bone, left wrist, subsequent encounter for fracture with routine healing: Secondary | ICD-10-CM | POA: Diagnosis not present

## 2019-03-15 DIAGNOSIS — S82141D Displaced bicondylar fracture of right tibia, subsequent encounter for closed fracture with routine healing: Secondary | ICD-10-CM | POA: Diagnosis not present

## 2019-03-15 DIAGNOSIS — Z9181 History of falling: Secondary | ICD-10-CM | POA: Diagnosis not present

## 2019-03-16 DIAGNOSIS — Z9181 History of falling: Secondary | ICD-10-CM | POA: Diagnosis not present

## 2019-03-16 DIAGNOSIS — S62102D Fracture of unspecified carpal bone, left wrist, subsequent encounter for fracture with routine healing: Secondary | ICD-10-CM | POA: Diagnosis not present

## 2019-03-16 DIAGNOSIS — S82141D Displaced bicondylar fracture of right tibia, subsequent encounter for closed fracture with routine healing: Secondary | ICD-10-CM | POA: Diagnosis not present

## 2019-03-21 DIAGNOSIS — S6292XA Unspecified fracture of left wrist and hand, initial encounter for closed fracture: Secondary | ICD-10-CM | POA: Diagnosis not present

## 2019-03-21 DIAGNOSIS — S82101A Unspecified fracture of upper end of right tibia, initial encounter for closed fracture: Secondary | ICD-10-CM | POA: Diagnosis not present

## 2019-03-22 DIAGNOSIS — Z9181 History of falling: Secondary | ICD-10-CM | POA: Diagnosis not present

## 2019-03-22 DIAGNOSIS — S62102D Fracture of unspecified carpal bone, left wrist, subsequent encounter for fracture with routine healing: Secondary | ICD-10-CM | POA: Diagnosis not present

## 2019-03-22 DIAGNOSIS — S82141D Displaced bicondylar fracture of right tibia, subsequent encounter for closed fracture with routine healing: Secondary | ICD-10-CM | POA: Diagnosis not present

## 2019-03-24 DIAGNOSIS — Z9181 History of falling: Secondary | ICD-10-CM | POA: Diagnosis not present

## 2019-03-24 DIAGNOSIS — S82141D Displaced bicondylar fracture of right tibia, subsequent encounter for closed fracture with routine healing: Secondary | ICD-10-CM | POA: Diagnosis not present

## 2019-03-24 DIAGNOSIS — S62102D Fracture of unspecified carpal bone, left wrist, subsequent encounter for fracture with routine healing: Secondary | ICD-10-CM | POA: Diagnosis not present

## 2019-03-30 DIAGNOSIS — Z9181 History of falling: Secondary | ICD-10-CM | POA: Diagnosis not present

## 2019-03-30 DIAGNOSIS — S82141D Displaced bicondylar fracture of right tibia, subsequent encounter for closed fracture with routine healing: Secondary | ICD-10-CM | POA: Diagnosis not present

## 2019-03-30 DIAGNOSIS — S62102D Fracture of unspecified carpal bone, left wrist, subsequent encounter for fracture with routine healing: Secondary | ICD-10-CM | POA: Diagnosis not present

## 2019-04-04 DIAGNOSIS — Z9889 Other specified postprocedural states: Secondary | ICD-10-CM | POA: Insufficient documentation

## 2019-04-04 HISTORY — DX: Other specified postprocedural states: Z98.890

## 2019-04-05 DIAGNOSIS — S82141D Displaced bicondylar fracture of right tibia, subsequent encounter for closed fracture with routine healing: Secondary | ICD-10-CM | POA: Diagnosis not present

## 2019-04-05 DIAGNOSIS — Z9181 History of falling: Secondary | ICD-10-CM | POA: Diagnosis not present

## 2019-04-05 DIAGNOSIS — S62102D Fracture of unspecified carpal bone, left wrist, subsequent encounter for fracture with routine healing: Secondary | ICD-10-CM | POA: Diagnosis not present

## 2019-04-14 DIAGNOSIS — S6292XA Unspecified fracture of left wrist and hand, initial encounter for closed fracture: Secondary | ICD-10-CM | POA: Diagnosis not present

## 2019-04-14 DIAGNOSIS — S82101D Unspecified fracture of upper end of right tibia, subsequent encounter for closed fracture with routine healing: Secondary | ICD-10-CM | POA: Diagnosis not present

## 2019-04-15 DIAGNOSIS — Z9181 History of falling: Secondary | ICD-10-CM | POA: Diagnosis not present

## 2019-04-15 DIAGNOSIS — S62102D Fracture of unspecified carpal bone, left wrist, subsequent encounter for fracture with routine healing: Secondary | ICD-10-CM | POA: Diagnosis not present

## 2019-04-15 DIAGNOSIS — S82141D Displaced bicondylar fracture of right tibia, subsequent encounter for closed fracture with routine healing: Secondary | ICD-10-CM | POA: Diagnosis not present

## 2019-04-18 DIAGNOSIS — S82141D Displaced bicondylar fracture of right tibia, subsequent encounter for closed fracture with routine healing: Secondary | ICD-10-CM | POA: Diagnosis not present

## 2019-04-18 DIAGNOSIS — S62102D Fracture of unspecified carpal bone, left wrist, subsequent encounter for fracture with routine healing: Secondary | ICD-10-CM | POA: Diagnosis not present

## 2019-04-18 DIAGNOSIS — Z9181 History of falling: Secondary | ICD-10-CM | POA: Diagnosis not present

## 2019-04-19 DIAGNOSIS — S62102D Fracture of unspecified carpal bone, left wrist, subsequent encounter for fracture with routine healing: Secondary | ICD-10-CM | POA: Diagnosis not present

## 2019-04-19 DIAGNOSIS — Z9181 History of falling: Secondary | ICD-10-CM | POA: Diagnosis not present

## 2019-04-19 DIAGNOSIS — S82141D Displaced bicondylar fracture of right tibia, subsequent encounter for closed fracture with routine healing: Secondary | ICD-10-CM | POA: Diagnosis not present

## 2019-04-21 DIAGNOSIS — Z9181 History of falling: Secondary | ICD-10-CM | POA: Diagnosis not present

## 2019-04-21 DIAGNOSIS — S82141D Displaced bicondylar fracture of right tibia, subsequent encounter for closed fracture with routine healing: Secondary | ICD-10-CM | POA: Diagnosis not present

## 2019-04-21 DIAGNOSIS — S62102D Fracture of unspecified carpal bone, left wrist, subsequent encounter for fracture with routine healing: Secondary | ICD-10-CM | POA: Diagnosis not present

## 2019-04-26 DIAGNOSIS — S82141D Displaced bicondylar fracture of right tibia, subsequent encounter for closed fracture with routine healing: Secondary | ICD-10-CM | POA: Diagnosis not present

## 2019-04-26 DIAGNOSIS — Z9181 History of falling: Secondary | ICD-10-CM | POA: Diagnosis not present

## 2019-04-26 DIAGNOSIS — S62102D Fracture of unspecified carpal bone, left wrist, subsequent encounter for fracture with routine healing: Secondary | ICD-10-CM | POA: Diagnosis not present

## 2019-04-27 DIAGNOSIS — S82141D Displaced bicondylar fracture of right tibia, subsequent encounter for closed fracture with routine healing: Secondary | ICD-10-CM | POA: Diagnosis not present

## 2019-04-27 DIAGNOSIS — S62102D Fracture of unspecified carpal bone, left wrist, subsequent encounter for fracture with routine healing: Secondary | ICD-10-CM | POA: Diagnosis not present

## 2019-04-27 DIAGNOSIS — Z9181 History of falling: Secondary | ICD-10-CM | POA: Diagnosis not present

## 2019-04-28 DIAGNOSIS — Z9181 History of falling: Secondary | ICD-10-CM | POA: Diagnosis not present

## 2019-04-28 DIAGNOSIS — S82141D Displaced bicondylar fracture of right tibia, subsequent encounter for closed fracture with routine healing: Secondary | ICD-10-CM | POA: Diagnosis not present

## 2019-04-28 DIAGNOSIS — S62102D Fracture of unspecified carpal bone, left wrist, subsequent encounter for fracture with routine healing: Secondary | ICD-10-CM | POA: Diagnosis not present

## 2019-05-02 DIAGNOSIS — M25661 Stiffness of right knee, not elsewhere classified: Secondary | ICD-10-CM | POA: Diagnosis not present

## 2019-05-02 DIAGNOSIS — M25561 Pain in right knee: Secondary | ICD-10-CM | POA: Diagnosis not present

## 2019-05-04 DIAGNOSIS — M25561 Pain in right knee: Secondary | ICD-10-CM | POA: Diagnosis not present

## 2019-05-04 DIAGNOSIS — M25661 Stiffness of right knee, not elsewhere classified: Secondary | ICD-10-CM | POA: Diagnosis not present

## 2019-05-05 ENCOUNTER — Encounter: Payer: BC Managed Care – PPO | Admitting: Internal Medicine

## 2019-05-06 DIAGNOSIS — M25661 Stiffness of right knee, not elsewhere classified: Secondary | ICD-10-CM | POA: Diagnosis not present

## 2019-05-06 DIAGNOSIS — M25561 Pain in right knee: Secondary | ICD-10-CM | POA: Diagnosis not present

## 2019-05-09 DIAGNOSIS — M25561 Pain in right knee: Secondary | ICD-10-CM | POA: Diagnosis not present

## 2019-05-09 DIAGNOSIS — M25661 Stiffness of right knee, not elsewhere classified: Secondary | ICD-10-CM | POA: Diagnosis not present

## 2019-05-11 DIAGNOSIS — M25661 Stiffness of right knee, not elsewhere classified: Secondary | ICD-10-CM | POA: Diagnosis not present

## 2019-05-11 DIAGNOSIS — M25561 Pain in right knee: Secondary | ICD-10-CM | POA: Diagnosis not present

## 2019-05-12 DIAGNOSIS — M222X1 Patellofemoral disorders, right knee: Secondary | ICD-10-CM | POA: Diagnosis not present

## 2019-05-12 DIAGNOSIS — S6292XA Unspecified fracture of left wrist and hand, initial encounter for closed fracture: Secondary | ICD-10-CM | POA: Diagnosis not present

## 2019-05-13 DIAGNOSIS — M25561 Pain in right knee: Secondary | ICD-10-CM | POA: Diagnosis not present

## 2019-05-13 DIAGNOSIS — M25661 Stiffness of right knee, not elsewhere classified: Secondary | ICD-10-CM | POA: Diagnosis not present

## 2019-05-16 DIAGNOSIS — M25561 Pain in right knee: Secondary | ICD-10-CM | POA: Diagnosis not present

## 2019-05-16 DIAGNOSIS — M25661 Stiffness of right knee, not elsewhere classified: Secondary | ICD-10-CM | POA: Diagnosis not present

## 2019-05-18 DIAGNOSIS — M25561 Pain in right knee: Secondary | ICD-10-CM | POA: Diagnosis not present

## 2019-05-18 DIAGNOSIS — M25661 Stiffness of right knee, not elsewhere classified: Secondary | ICD-10-CM | POA: Diagnosis not present

## 2019-05-20 DIAGNOSIS — M25661 Stiffness of right knee, not elsewhere classified: Secondary | ICD-10-CM | POA: Diagnosis not present

## 2019-05-20 DIAGNOSIS — M25561 Pain in right knee: Secondary | ICD-10-CM | POA: Diagnosis not present

## 2019-05-23 DIAGNOSIS — M25661 Stiffness of right knee, not elsewhere classified: Secondary | ICD-10-CM | POA: Diagnosis not present

## 2019-05-23 DIAGNOSIS — M25561 Pain in right knee: Secondary | ICD-10-CM | POA: Diagnosis not present

## 2019-05-24 ENCOUNTER — Other Ambulatory Visit: Payer: Self-pay | Admitting: Internal Medicine

## 2019-05-24 DIAGNOSIS — E785 Hyperlipidemia, unspecified: Secondary | ICD-10-CM

## 2019-05-24 MED ORDER — ATORVASTATIN CALCIUM 10 MG PO TABS: 10.0000 mg | ORAL_TABLET | Freq: Every day | ORAL | 3 refills | Status: DC

## 2019-05-25 DIAGNOSIS — M25561 Pain in right knee: Secondary | ICD-10-CM | POA: Diagnosis not present

## 2019-05-25 DIAGNOSIS — M25661 Stiffness of right knee, not elsewhere classified: Secondary | ICD-10-CM | POA: Diagnosis not present

## 2019-05-27 DIAGNOSIS — M25661 Stiffness of right knee, not elsewhere classified: Secondary | ICD-10-CM | POA: Diagnosis not present

## 2019-05-27 DIAGNOSIS — M25561 Pain in right knee: Secondary | ICD-10-CM | POA: Diagnosis not present

## 2019-05-30 DIAGNOSIS — M25661 Stiffness of right knee, not elsewhere classified: Secondary | ICD-10-CM | POA: Diagnosis not present

## 2019-05-30 DIAGNOSIS — M25561 Pain in right knee: Secondary | ICD-10-CM | POA: Diagnosis not present

## 2019-06-01 DIAGNOSIS — M25661 Stiffness of right knee, not elsewhere classified: Secondary | ICD-10-CM | POA: Diagnosis not present

## 2019-06-01 DIAGNOSIS — M25561 Pain in right knee: Secondary | ICD-10-CM | POA: Diagnosis not present

## 2019-06-03 DIAGNOSIS — M25561 Pain in right knee: Secondary | ICD-10-CM | POA: Diagnosis not present

## 2019-06-03 DIAGNOSIS — M25661 Stiffness of right knee, not elsewhere classified: Secondary | ICD-10-CM | POA: Diagnosis not present

## 2019-06-06 DIAGNOSIS — M25661 Stiffness of right knee, not elsewhere classified: Secondary | ICD-10-CM | POA: Diagnosis not present

## 2019-06-06 DIAGNOSIS — M25561 Pain in right knee: Secondary | ICD-10-CM | POA: Diagnosis not present

## 2019-06-08 DIAGNOSIS — M25561 Pain in right knee: Secondary | ICD-10-CM | POA: Diagnosis not present

## 2019-06-08 DIAGNOSIS — S6292XA Unspecified fracture of left wrist and hand, initial encounter for closed fracture: Secondary | ICD-10-CM | POA: Diagnosis not present

## 2019-06-08 DIAGNOSIS — M222X1 Patellofemoral disorders, right knee: Secondary | ICD-10-CM | POA: Diagnosis not present

## 2019-06-08 DIAGNOSIS — M25661 Stiffness of right knee, not elsewhere classified: Secondary | ICD-10-CM | POA: Diagnosis not present

## 2019-06-10 DIAGNOSIS — M25661 Stiffness of right knee, not elsewhere classified: Secondary | ICD-10-CM | POA: Diagnosis not present

## 2019-06-10 DIAGNOSIS — M25561 Pain in right knee: Secondary | ICD-10-CM | POA: Diagnosis not present

## 2019-06-14 ENCOUNTER — Other Ambulatory Visit: Payer: Self-pay | Admitting: Internal Medicine

## 2019-06-14 DIAGNOSIS — E039 Hypothyroidism, unspecified: Secondary | ICD-10-CM

## 2019-06-14 MED ORDER — LEVOTHYROXINE SODIUM 75 MCG PO TABS: ORAL_TABLET | ORAL | 0 refills | Status: DC

## 2019-06-14 NOTE — Telephone Encounter (Signed)
Copied from [place] [id number]. Topic: Quick Communication - Rx Refill/Question >> Jun 14, 2019  4:40 PM Yvette Rack wrote: Medication: levothyroxine (SYNTHROID, LEVOTHROID) 75 MCG tablet  Has the patient contacted their pharmacy? yes   Preferred Pharmacy (with phone number or street name): [id number] - [place], [place] [phone] (Phone) [phone] (Fax)  Agent: Please be advised that RX refills may take up to 3 business days. We ask that you follow-up with your pharmacy.

## 2019-08-03 DIAGNOSIS — M25561 Pain in right knee: Secondary | ICD-10-CM | POA: Diagnosis not present

## 2019-08-03 DIAGNOSIS — M25661 Stiffness of right knee, not elsewhere classified: Secondary | ICD-10-CM | POA: Diagnosis not present

## 2019-08-26 ENCOUNTER — Other Ambulatory Visit: Payer: Self-pay | Admitting: Internal Medicine

## 2019-08-26 DIAGNOSIS — I10 Essential (primary) hypertension: Secondary | ICD-10-CM

## 2019-08-26 DIAGNOSIS — E785 Hyperlipidemia, unspecified: Secondary | ICD-10-CM

## 2019-08-26 MED ORDER — ATORVASTATIN CALCIUM 10 MG PO TABS: 10.0000 mg | ORAL_TABLET | Freq: Every day | ORAL | 3 refills | Status: DC

## 2019-08-26 MED ORDER — AMLODIPINE BESYLATE 5 MG PO TABS: 5.0000 mg | ORAL_TABLET | Freq: Every day | ORAL | 3 refills | Status: DC

## 2019-09-13 ENCOUNTER — Other Ambulatory Visit: Payer: Self-pay | Admitting: Internal Medicine

## 2019-09-13 DIAGNOSIS — E039 Hypothyroidism, unspecified: Secondary | ICD-10-CM

## 2019-09-13 MED ORDER — LEVOTHYROXINE SODIUM 75 MCG PO TABS: ORAL_TABLET | ORAL | 2 refills | Status: DC

## 2019-10-01 ENCOUNTER — Ambulatory Visit: Payer: BC Managed Care – PPO

## 2019-10-01 ENCOUNTER — Ambulatory Visit: Payer: BC Managed Care – PPO | Attending: Internal Medicine

## 2019-10-01 DIAGNOSIS — Z23 Encounter for immunization: Secondary | ICD-10-CM | POA: Insufficient documentation

## 2019-10-01 NOTE — Progress Notes (Signed)
   Covid-19 Vaccination Clinic  Name:  LYNNLEE KAGARISE    MRN: [id number] DOB: Jun 19, 1956  10/01/2019  Ms. Berrier was observed post Covid-19 immunization for 15 minutes without incidence. She was provided with Vaccine Information Sheet and instruction to access the V-Safe system.   Ms. Toma was instructed to call 911 with any severe reactions post vaccine: Marland Kitchen Difficulty breathing  . Swelling of your face and throat  . A fast heartbeat  . A bad rash all over your body  . Dizziness and weakness    Immunizations Administered    Name Date Dose VIS Date Route   Moderna COVID-19 Vaccine 10/01/2019  2:03 PM 0.5 mL 07/05/2019 Intramuscular   Manufacturer: Moderna   Lot: [id number]   [id number]

## 2019-10-10 ENCOUNTER — Other Ambulatory Visit: Payer: Self-pay | Admitting: Internal Medicine

## 2019-10-10 DIAGNOSIS — I10 Essential (primary) hypertension: Secondary | ICD-10-CM

## 2019-10-10 MED ORDER — AMLODIPINE BESYLATE 5 MG PO TABS: 5.0000 mg | ORAL_TABLET | Freq: Every day | ORAL | 3 refills | Status: DC

## 2019-10-18 IMAGING — US US ABDOMEN COMPLETE
1 series · 14 of 25 positions shown · non-contrast
Comparison: CT abdomen and pelvis May 21, 2008

CLINICAL DATA: Elevated liver enzymes

EXAM:
ABDOMEN ULTRASOUND COMPLETE

[Series 1: us abdomen complete · 0.22mm/px · 14 of 83 slices shown]
[im 1/83]
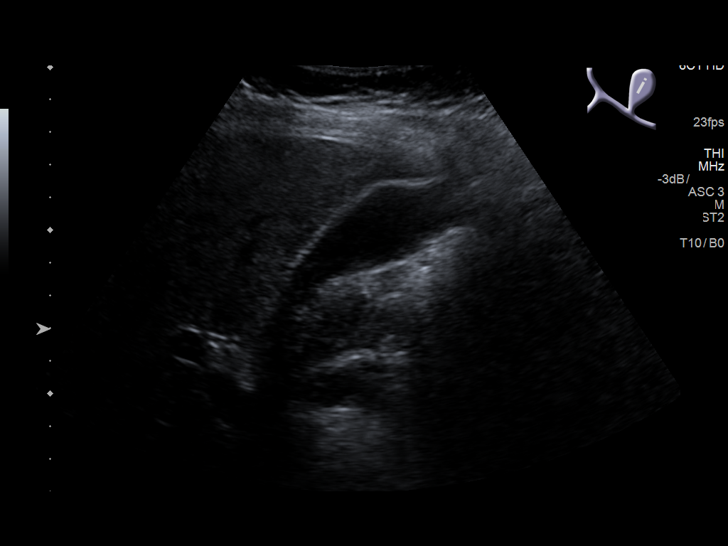
[im 7/83]
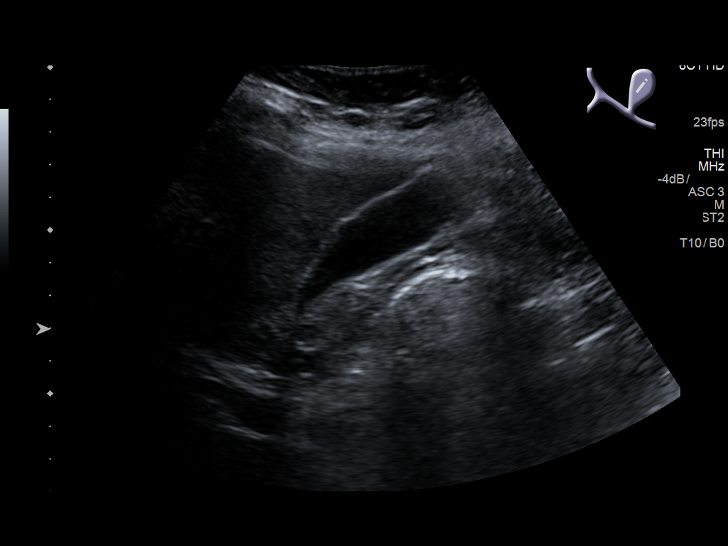
[im 14/83]
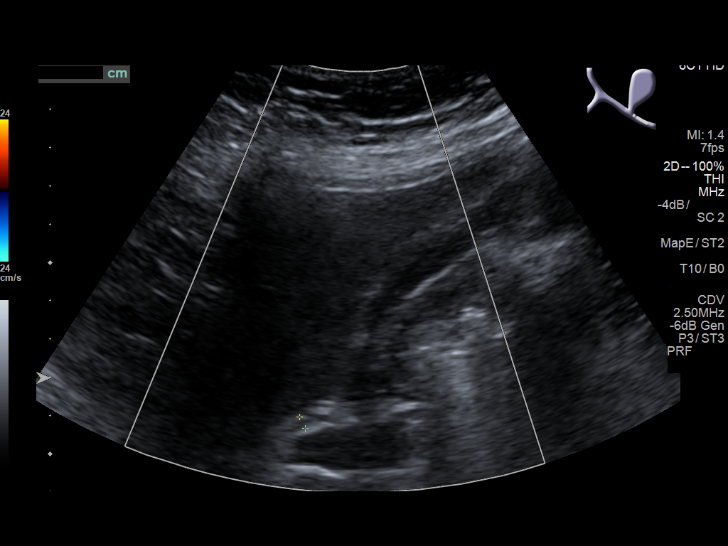
[im 21/83]
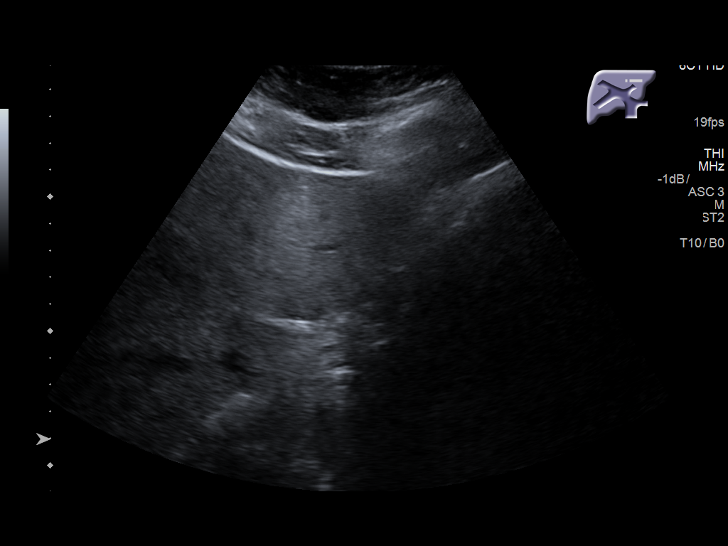
[im 28/83]
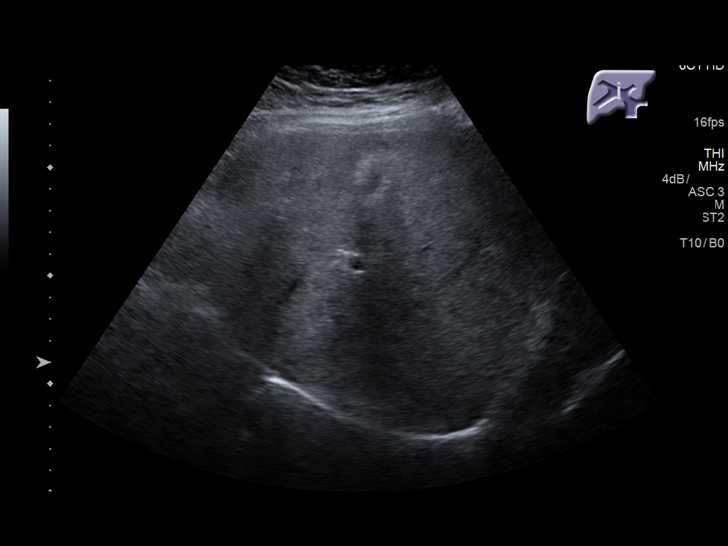
[im 31/83]
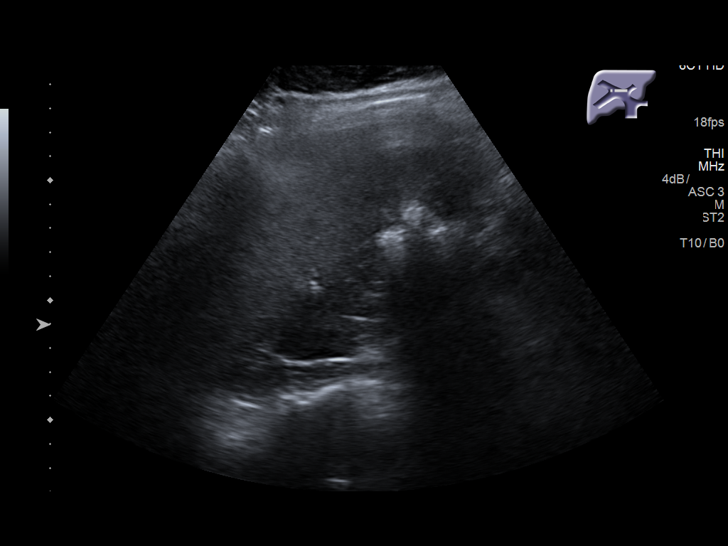
[im 38/83]
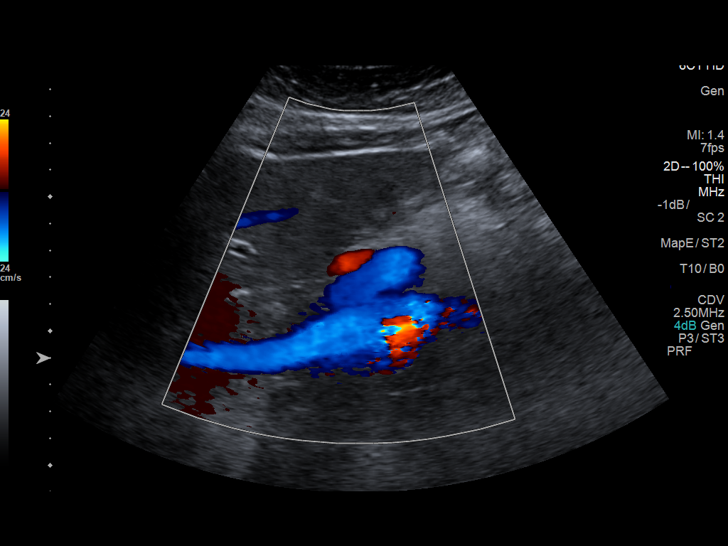
[im 45/83]
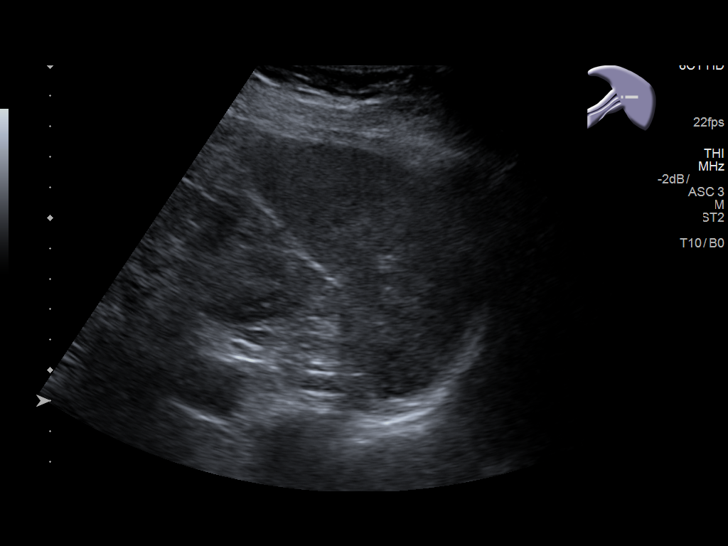
[im 52/83]
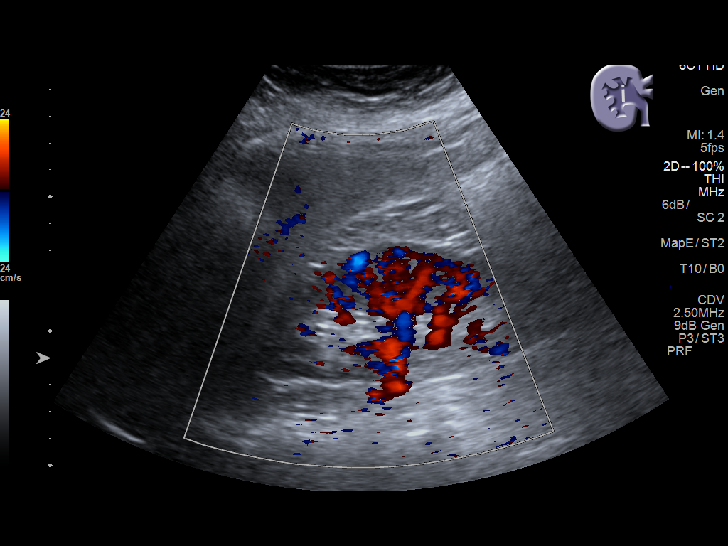
[im 55/83]
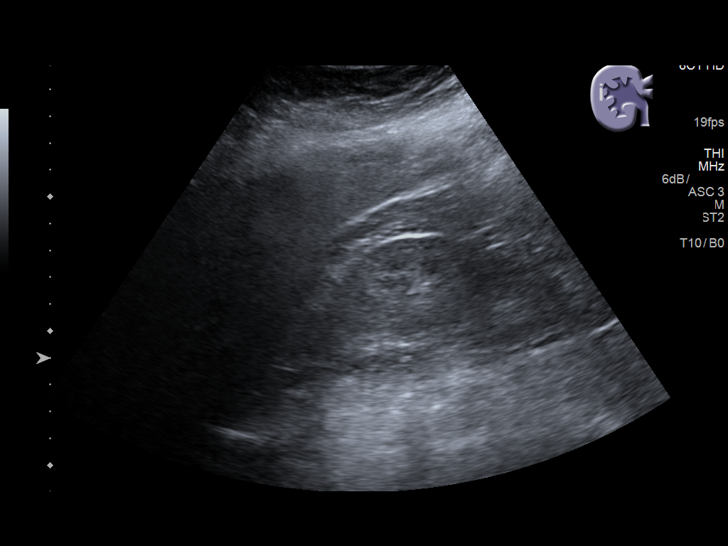
[im 62/83]
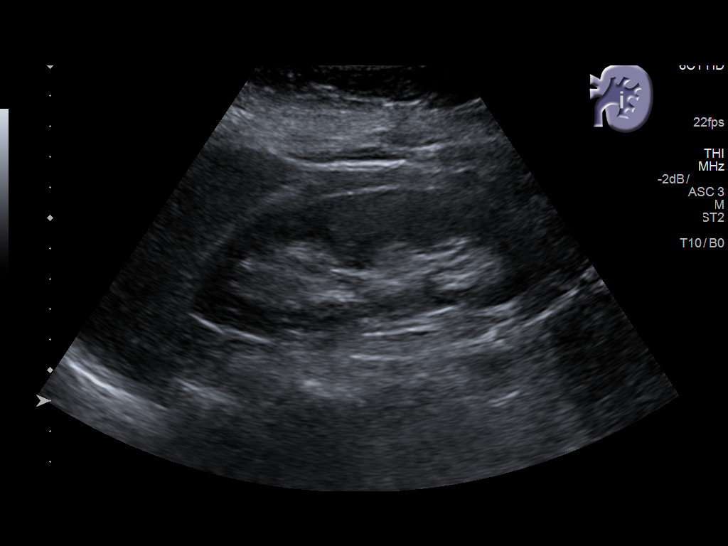
[im 69/83]
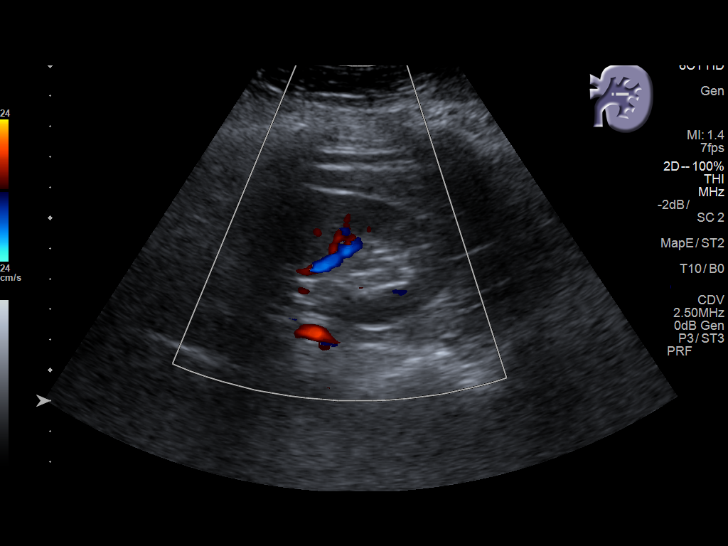
[im 76/83]
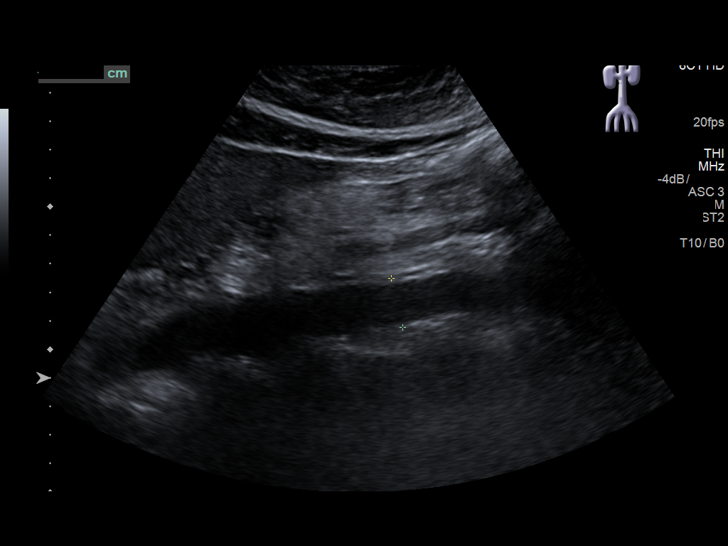
[im 83/83]
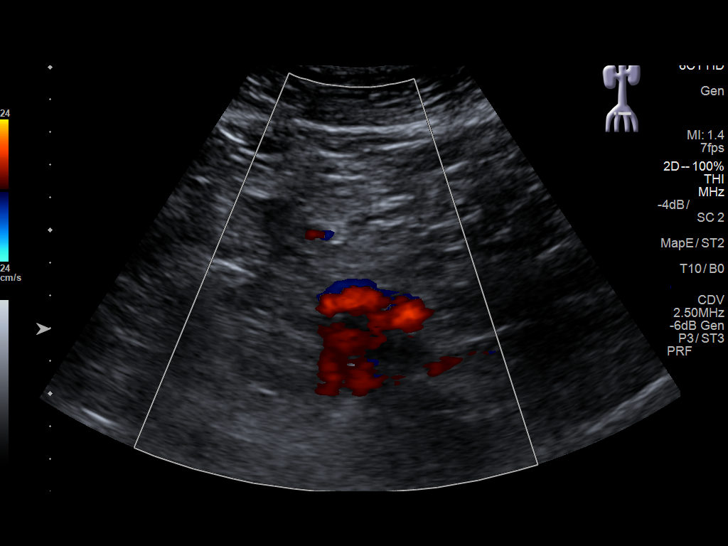

[14 of 25 positions shown; findings below may reference images not displayed]

FINDINGS: Gallbladder: No gallstones or wall thickening visualized. There is
no pericholecystic fluid. No sonographic Murphy sign noted by
sonographer.

Common bile duct: Diameter: 3 mm. No intrahepatic, common hepatic,
or common bile duct dilatation.

Liver: No focal lesion identified. Within normal limits in
parenchymal echogenicity. Portal vein is patent on color Doppler
imaging with normal direction of blood flow towards the liver.

IVC: No abnormality visualized.

Pancreas: No pancreatic mass or inflammatory focus.

Spleen: Size and appearance within normal limits.

Right Kidney: Length: 12.1 cm. Echogenicity within normal limits. No
mass or hydronephrosis visualized.

Left Kidney: Length: 12.3 cm. Echogenicity within normal limits. No
mass or hydronephrosis visualized.

Abdominal aorta: No aneurysm visualized.

Other findings: No demonstrable ascites.
IMPRESSION: Study within normal limits.

## 2019-10-29 ENCOUNTER — Ambulatory Visit: Payer: Self-pay | Attending: Internal Medicine

## 2019-10-29 DIAGNOSIS — Z23 Encounter for immunization: Secondary | ICD-10-CM

## 2019-10-29 NOTE — Progress Notes (Signed)
   Covid-19 Vaccination Clinic  Name:  Lindsay Carney    MRN: [id number] DOB: Jun 23, 1956  10/29/2019  Lindsay Carney was observed post Covid-19 immunization for 15 minutes without incident. She was provided with Vaccine Information Sheet and instruction to access the V-Safe system.   Lindsay Carney was instructed to call 911 with any severe reactions post vaccine: Marland Kitchen Difficulty breathing  . Swelling of face and throat  . A fast heartbeat  . A bad rash all over body  . Dizziness and weakness   Immunizations Administered    Name Date Dose VIS Date Route   Moderna COVID-19 Vaccine 10/29/2019 10:46 AM 0.5 mL 07/05/2019 Intramuscular   Manufacturer: Levan Hurst   LotUD:[id number]   [place]:[id number]

## 2020-01-19 ENCOUNTER — Telehealth: Payer: Self-pay | Admitting: Internal Medicine

## 2020-01-19 NOTE — Telephone Encounter (Signed)
Pt needs an order for a mammogram

## 2020-01-20 ENCOUNTER — Other Ambulatory Visit: Payer: Self-pay | Admitting: Internal Medicine

## 2020-01-20 DIAGNOSIS — Z1231 Encounter for screening mammogram for malignant neoplasm of breast: Secondary | ICD-10-CM

## 2020-01-20 NOTE — Telephone Encounter (Signed)
Order in for mammogram  °

## 2020-01-20 NOTE — Telephone Encounter (Signed)
Left message informing the patient that order has been placed and that I would also send a mychart message.   Message sent

## 2020-01-20 NOTE — Telephone Encounter (Signed)
Last MM 02/2019. Okay to place order?

## 2020-01-31 ENCOUNTER — Encounter: Payer: Self-pay | Admitting: Internal Medicine

## 2020-02-21 ENCOUNTER — Other Ambulatory Visit: Payer: Self-pay | Admitting: Internal Medicine

## 2020-02-21 DIAGNOSIS — E785 Hyperlipidemia, unspecified: Secondary | ICD-10-CM

## 2020-02-27 ENCOUNTER — Ambulatory Visit
Admission: RE | Admit: 2020-02-27 | Discharge: 2020-02-27 | Disposition: A | Discharge status: Home / Self Care | Payer: 59 | Source: Ambulatory Visit | Attending: Internal Medicine | Admitting: Internal Medicine

## 2020-02-27 DIAGNOSIS — Z1231 Encounter for screening mammogram for malignant neoplasm of breast: Secondary | ICD-10-CM | POA: Insufficient documentation

## 2020-03-06 ENCOUNTER — Ambulatory Visit (INDEPENDENT_AMBULATORY_CARE_PROVIDER_SITE_OTHER): Payer: 59 | Admitting: Internal Medicine

## 2020-03-06 ENCOUNTER — Encounter: Payer: Self-pay | Admitting: Internal Medicine

## 2020-03-06 ENCOUNTER — Other Ambulatory Visit: Payer: Self-pay

## 2020-03-06 VITALS — BP 130/82 | HR 74 | Temp 98.5°F | Ht 62.21 in | Wt 184.2 lb

## 2020-03-06 DIAGNOSIS — I10 Essential (primary) hypertension: Secondary | ICD-10-CM | POA: Diagnosis not present

## 2020-03-06 DIAGNOSIS — E669 Obesity, unspecified: Secondary | ICD-10-CM

## 2020-03-06 DIAGNOSIS — E559 Vitamin D deficiency, unspecified: Secondary | ICD-10-CM | POA: Diagnosis not present

## 2020-03-06 DIAGNOSIS — Z0000 Encounter for general adult medical examination without abnormal findings: Secondary | ICD-10-CM | POA: Diagnosis not present

## 2020-03-06 DIAGNOSIS — E039 Hypothyroidism, unspecified: Secondary | ICD-10-CM | POA: Diagnosis not present

## 2020-03-06 DIAGNOSIS — E785 Hyperlipidemia, unspecified: Secondary | ICD-10-CM

## 2020-03-06 DIAGNOSIS — Z1211 Encounter for screening for malignant neoplasm of colon: Secondary | ICD-10-CM

## 2020-03-06 DIAGNOSIS — R7303 Prediabetes: Secondary | ICD-10-CM | POA: Insufficient documentation

## 2020-03-06 DIAGNOSIS — Z1389 Encounter for screening for other disorder: Secondary | ICD-10-CM

## 2020-03-06 DIAGNOSIS — R739 Hyperglycemia, unspecified: Secondary | ICD-10-CM | POA: Diagnosis not present

## 2020-03-06 DIAGNOSIS — B351 Tinea unguium: Secondary | ICD-10-CM

## 2020-03-06 LAB — LIPID PANEL
Cholesterol: 188 mg/dL (ref 0–200)
HDL: 51.3 mg/dL (ref >39.00)
LDL Cholesterol: 116 mg/dL — ABNORMAL HIGH (ref 0–99)
NonHDL: 137.03
Total CHOL/HDL Ratio: 4
Triglycerides: 105 mg/dL (ref 0.0–149.0)
VLDL: 21 mg/dL (ref 0.0–40.0)

## 2020-03-06 LAB — COMPREHENSIVE METABOLIC PANEL
ALT: 18 U/L (ref 0–35)
AST: 15 U/L (ref 0–37)
Albumin: 4.5 g/dL (ref 3.5–5.2)
Alkaline Phosphatase: 98 U/L (ref 39–117)
BUN: 13 mg/dL (ref 6–23)
CO2: 30 mEq/L (ref 19–32)
Calcium: 9.4 mg/dL (ref 8.4–10.5)
Chloride: 103 mEq/L (ref 96–112)
Creatinine, Ser: 0.58 mg/dL (ref 0.40–1.20)
GFR: 105.33 mL/min (ref >60.00)
Glucose, Bld: 95 mg/dL (ref 70–99)
Potassium: 3.7 mEq/L (ref 3.5–5.1)
Sodium: 139 mEq/L (ref 135–145)
Total Bilirubin: 0.6 mg/dL (ref 0.2–1.2)
Total Protein: 6.9 g/dL (ref 6.0–8.3)

## 2020-03-06 LAB — CBC WITH DIFFERENTIAL/PLATELET
Basophils Absolute: 0 10*3/uL (ref 0.0–0.1)
Basophils Relative: 0.5 % (ref 0.0–3.0)
Eosinophils Absolute: 0.1 10*3/uL (ref 0.0–0.7)
Eosinophils Relative: 1.5 % (ref 0.0–5.0)
HCT: 43.3 % (ref 36.0–46.0)
Hemoglobin: 14.6 g/dL (ref 12.0–15.0)
Lymphocytes Relative: 24 % (ref 12.0–46.0)
Lymphs Abs: 1.4 10*3/uL (ref 0.7–4.0)
MCHC: 33.7 g/dL (ref 30.0–36.0)
MCV: 87.3 fl (ref 78.0–100.0)
Monocytes Absolute: 0.4 10*3/uL (ref 0.1–1.0)
Monocytes Relative: 6.9 % (ref 3.0–12.0)
Neutro Abs: 3.8 10*3/uL (ref 1.4–7.7)
Neutrophils Relative %: 67.1 % (ref 43.0–77.0)
Platelets: 267 10*3/uL (ref 150.0–400.0)
RBC: 4.96 Mil/uL (ref 3.87–5.11)
RDW: 13.7 % (ref 11.5–15.5)
WBC: 5.7 10*3/uL (ref 4.0–10.5)

## 2020-03-06 LAB — TSH: TSH: 2.01 u[IU]/mL (ref 0.35–4.50)

## 2020-03-06 LAB — VITAMIN D 25 HYDROXY (VIT D DEFICIENCY, FRACTURES): VITD: 31.75 ng/mL (ref 30.00–100.00)

## 2020-03-06 LAB — HEMOGLOBIN A1C: Hgb A1c MFr Bld: 5.7 % (ref 4.6–6.5)

## 2020-03-06 MED ORDER — TERBINAFINE HCL 250 MG PO TABS: 250.0000 mg | ORAL_TABLET | Freq: Every day | ORAL | 0 refills | Status: DC

## 2020-03-06 MED ORDER — ATORVASTATIN CALCIUM 10 MG PO TABS: 10.0000 mg | ORAL_TABLET | Freq: Every day | ORAL | 3 refills | Status: DC

## 2020-03-06 MED ORDER — LEVOTHYROXINE SODIUM 75 MCG PO TABS: ORAL_TABLET | ORAL | 3 refills | Status: DC

## 2020-03-06 MED ORDER — AMLODIPINE BESYLATE 5 MG PO TABS: 5.0000 mg | ORAL_TABLET | Freq: Every day | ORAL | 3 refills | Status: DC

## 2020-03-06 NOTE — Patient Instructions (Addendum)
Dr. [facility], [facility]   Consider shingrix vaccine in the future   Zoster Vaccine, Recombinant injection What is this medicine? ZOSTER VACCINE (ZOS ter vak SEEN) is used to prevent shingles in adults 62 years old and over. This vaccine is not used to treat shingles or nerve pain from shingles. This medicine may be used for other purposes; ask your health care provider or pharmacist if you have questions. COMMON BRAND NAME(S): [facility] What should I tell my health care provider before I take this medicine? They need to know if you have any of these conditions:  blood disorders or disease  cancer like leukemia or lymphoma  immune system problems or therapy  an unusual or allergic reaction to vaccines, other medications, foods, dyes, or preservatives  pregnant or trying to get pregnant  breast-feeding How should I use this medicine? This vaccine is for injection in a muscle. It is given by a health care professional. Talk to your pediatrician regarding the use of this medicine in children. This medicine is not approved for use in children. Overdosage: If you think you have taken too much of this medicine contact a poison control center or emergency room at once. NOTE: This medicine is only for you. Do not share this medicine with others. What if I miss a dose? Keep appointments for follow-up (booster) doses as directed. It is important not to miss your dose. Call your doctor or health care professional if you are unable to keep an appointment. What may interact with this medicine?  medicines that suppress your immune system  medicines to treat cancer  steroid medicines like prednisone or cortisone This list may not describe all possible interactions. Give your health care provider a list of all the medicines, herbs, non-prescription drugs, or dietary supplements you use. Also tell them if you smoke, drink alcohol, or use illegal  drugs. Some items may interact with your medicine. What should I watch for while using this medicine? Visit your doctor for regular check ups. This vaccine, like all vaccines, may not fully protect everyone. What side effects may I notice from receiving this medicine? Side effects that you should report to your doctor or health care professional as soon as possible:  allergic reactions like skin rash, itching or hives, swelling of the face, lips, or tongue  breathing problems Side effects that usually do not require medical attention (report these to your doctor or health care professional if they continue or are bothersome):  chills  headache  fever  nausea, vomiting  redness, warmth, pain, swelling or itching at site where injected  tiredness This list may not describe all possible side effects. Call your doctor for medical advice about side effects. You may report side effects to FDA at 1-800-FDA-1088. Where should I keep my medicine? This vaccine is only given in a clinic, pharmacy, doctor's office, or other health care setting and will not be stored at home. NOTE: This sheet is a summary. It may not cover all possible information. If you have questions about this medicine, talk to your doctor, pharmacist, or health care provider.  2020 Elsevier/Gold Standard (2017-03-02 13:20:30)    Terbinafine tablets What is this medicine? TERBINAFINE (TER bin a feen) is an antifungal medicine. It is used to treat certain kinds of fungal or yeast infections. This medicine may be used for other purposes; ask your health care provider or pharmacist if you have questions. COMMON BRAND NAME(S): Lamisil, Terbinex What should I  tell my health care provider before I take this medicine? They need to know if you have any of these conditions:  drink alcoholic beverages  kidney disease  liver disease  an unusual or allergic reaction to terbinafine, other medicines, foods, dyes, or  preservatives  pregnant or trying to get pregnant  breast-feeding How should I use this medicine? Take this medicine by mouth with a full glass of water. Follow the directions on the prescription label. You can take this medicine with food or on an empty stomach. Take your medicine at regular intervals. Do not take your medicine more often than directed. Do not skip doses or stop your medicine early even if you feel better. Do not stop taking except on your doctor's advice. Talk to your pediatrician regarding the use of this medicine in children. Special care may be needed. Overdosage: If you think you have taken too much of this medicine contact a poison control center or emergency room at once. NOTE: This medicine is only for you. Do not share this medicine with others. What if I miss a dose? If you miss a dose, take it as soon as you can. If it is almost time for your next dose, take only that dose. Do not take double or extra doses. What may interact with this medicine? Do not take this medicine with any of the following medications:  thioridazine This medicine may also interact with the following medications:  beta-blockers  caffeine  cimetidine  cyclosporine  medicines for depression, anxiety, or psychotic disturbances  medicines for fungal infections like fluconazole and ketoconazole  medicines for irregular heartbeat like amiodarone, flecainide and propafenone  rifampin  warfarin This list may not describe all possible interactions. Give your health care provider a list of all the medicines, herbs, non-prescription drugs, or dietary supplements you use. Also tell them if you smoke, drink alcohol, or use illegal drugs. Some items may interact with your medicine. What should I watch for while using this medicine? Visit your doctor or health care provider regularly. Tell your doctor right away if you have nausea or vomiting, loss of appetite, stomach pain on your right upper  side, yellow skin, dark urine, light stools, or are over tired. Some fungal infections need many weeks or months of treatment to cure. If you are taking this medicine for a long time, you will need to have important blood work done. This medicine may cause serious skin reactions. They can happen weeks to months after starting the medicine. Contact your health care provider right away if you notice fevers or flu-like symptoms with a rash. The rash may be red or purple and then turn into blisters or peeling of the skin. Or, you might notice a red rash with swelling of the face, lips or lymph nodes in your neck or under your arms. What side effects may I notice from receiving this medicine? Side effects that you should report to your doctor or health care professional as soon as possible:  allergic reactions like skin rash or hives, swelling of the face, lips, or tongue  changes in vision  dark urine  fever or infection  general ill feeling or flu-like symptoms  light-colored stools  loss of appetite, nausea  rash, fever, and swollen lymph nodes  redness, blistering, peeling or loosening of the skin, including inside the mouth  right upper belly pain  unusually weak or tired  yellowing of the eyes or skin Side effects that usually do not require medical  attention (report to your doctor or health care professional if they continue or are bothersome):  changes in taste  diarrhea  hair loss  muscle or joint pain  stomach gas  stomach upset This list may not describe all possible side effects. Call your doctor for medical advice about side effects. You may report side effects to FDA at 1-800-FDA-1088. Where should I keep my medicine? Keep out of the reach of children. Store at room temperature below 25 degrees C (77 degrees F). Protect from light. Throw away any unused medicine after the expiration date. NOTE: This sheet is a summary. It may not cover all possible information.  If you have questions about this medicine, talk to your doctor, pharmacist, or health care provider.  2020 Elsevier/Gold Standard (2018-10-29 15:37:07)

## 2020-03-06 NOTE — Progress Notes (Signed)
Chief Complaint  Patient presents with  . Annual Exam   Annual  1. HTN-elevated on norvasc 5 mg qd not checking BP at home  2. Left great toe fungus using topical otc and not helping nail yellow and discolored  3. Hypothyroidism on levo 75 mcg  Review of Systems  Constitutional: Negative for weight loss.  HENT: Negative for hearing loss.   Eyes: Negative for blurred vision.  Respiratory: Negative for shortness of breath.   Cardiovascular: Negative for chest pain.  Gastrointestinal: Negative for abdominal pain.  Musculoskeletal: Negative for falls.  Skin: Negative for rash.       +toenail fungus   Neurological: Negative for headaches.  Psychiatric/Behavioral: Negative for depression.   Past Medical History:  Diagnosis Date  . Anemia    age 64  . Complication of anesthesia   . Diffuse cystic mastopathy   . Hiatal hernia   . Hyperlipidemia   . Hypertension   . Hypothyroidism   . PONV (postoperative nausea and vomiting)    nausea  . Right tibial fracture    03/2019 right lateral tibial plateau fracture repair Dr. Sabra Heck   . Thyroid disease 2011   Past Surgical History:  Procedure Laterality Date  . ABDOMINAL HYSTERECTOMY     bladder attached  . BLADDER SURGERY    . BREAST CYST ASPIRATION Right   . BREAST SURGERY Right 04/01/10  . CAST APPLICATION Left 10/04/4399   Procedure: CAST APPLICATION;  Surgeon: Earnestine Leys, MD;  Location: [facility] ORS;  Service: Orthopedics;  Laterality: Left;  . COLONOSCOPY  2011  . HEMORRHOID SURGERY N/A 03/12/2017   Procedure: INTERNAL AND EXTERNAL HEMORRHOIDECTOMY;  Surgeon: Christene Lye, MD;  Location: [facility] ORS;  Service: General;  Laterality: N/A;  . HEMORROIDECTOMY     lanced  . lipoma removal  1999   inner knee  . ORIF TIBIA PLATEAU Right 03/08/2019   Procedure: OPEN REDUCTION INTERNAL FIXATION (ORIF) TIBIAL PLATEAU;  Surgeon: Earnestine Leys, MD;  Location: [facility] ORS;  Service: Orthopedics;  Laterality: Right;  . TOE SURGERY      Family History  Problem Relation Age of Onset  . Arthritis Mother   . Hypertension Mother   . Cancer Father        prostate and mouth  . Hyperlipidemia Father   . Diabetes Father   . Breast cancer Paternal Aunt   . Cancer Paternal Aunt    Social History   Socioeconomic History  . Marital status: Married    Spouse name: Not on file  . Number of children: Not on file  . Years of education: Not on file  . Highest education level: Not on file  Occupational History  . Not on file  Tobacco Use  . Smoking status: Never Smoker  . Smokeless tobacco: Never Used  Vaping Use  . Vaping Use: Never used  Substance and Sexual Activity  . Alcohol use: No  . Drug use: No  . Sexual activity: Not on file  Other Topics Concern  . Not on file  Social History Narrative   Married    Social Determinants of Health   Financial Resource Strain:   . Difficulty of Paying Living Expenses:   Food Insecurity:   . Worried About Charity fundraiser in the Last Year:   . Arboriculturist in the Last Year:   Transportation Needs:   . Film/video editor (Medical):   Marland Kitchen Lack of Transportation (Non-Medical):   Physical Activity:   .  Days of Exercise per Week:   . Minutes of Exercise per Session:   Stress:   . Feeling of Stress :   Social Connections:   . Frequency of Communication with Friends and Family:   . Frequency of Social Gatherings with Friends and Family:   . Attends Religious Services:   . Active Member of Clubs or Organizations:   . Attends Archivist Meetings:   Marland Kitchen Marital Status:   Intimate Partner Violence:   . Fear of Current or Ex-Partner:   . Emotionally Abused:   Marland Kitchen Physically Abused:   . Sexually Abused:    Current Meds  Medication Sig  . amLODipine (NORVASC) 5 MG tablet Take 1 tablet (5 mg total) by mouth daily.  Marland Kitchen atorvastatin (LIPITOR) 10 MG tablet Take 1 tablet (10 mg total) by mouth daily at 6 PM.  . cholecalciferol (VITAMIN D) 1000 UNITS tablet Take  1,000 Units by mouth daily with supper.   . levothyroxine (SYNTHROID) 75 MCG tablet TAKE 1 TABLET BY MOUTH ON EMPTY STOMACH 30-60 MIN BEFORE BREAKFAST  . meloxicam (MOBIC) 15 MG tablet Take 15 mg by mouth daily as needed for pain.   . Multiple Vitamins-Minerals (ONE-A-DAY 50 PLUS) TABS Take 1 tablet by mouth daily with supper.   . Omega-3 Fatty Acids (FISH OIL) 1200 MG CAPS Take 1,200 mg by mouth daily with supper.   . polyethylene glycol powder (MIRALAX) powder Take 1 Container by mouth once.  Marland Kitchen SALINE NASAL SPRAY NA Place into the nose.  . [DISCONTINUED] amLODipine (NORVASC) 5 MG tablet Take 1 tablet (5 mg total) by mouth daily.  . [DISCONTINUED] atorvastatin (LIPITOR) 10 MG tablet Take 1 tablet (10 mg total) by mouth daily at 6 PM.  . [DISCONTINUED] levothyroxine (SYNTHROID) 75 MCG tablet TAKE 1 TABLET BY MOUTH ON EMPTY STOMACH 30-60 MIN BEFORE BREAKFAST   No Known Allergies No results found for this or any previous visit (from the past 2160 hour(s)). Objective  Body mass index is 33.47 kg/m. Wt Readings from Last 3 Encounters:  03/06/20 184 lb 3.2 oz (83.6 kg)  03/07/19 175 lb (79.4 kg)  10/08/18 174 lb (78.9 kg)   Temp Readings from Last 3 Encounters:  03/06/20 98.5 F (36.9 C) (Oral)  03/10/19 99.2 F (37.3 C) (Oral)  10/08/18 97.8 F (36.6 C)   BP Readings from Last 3 Encounters:  03/06/20 130/82  03/11/19 134/77  10/08/18 130/78   Pulse Readings from Last 3 Encounters:  03/06/20 74  03/11/19 86  10/08/18 76    Physical Exam Vitals and nursing note reviewed.  Constitutional:      Appearance: Normal appearance. She is well-developed and well-groomed. She is obese.  HENT:     Head: Normocephalic and atraumatic.  Eyes:     Conjunctiva/sclera: Conjunctivae normal.     Pupils: Pupils are equal, round, and reactive to light.  Cardiovascular:     Rate and Rhythm: Normal rate and regular rhythm.     Heart sounds: Normal heart sounds. No murmur heard.   Pulmonary:      Effort: Pulmonary effort is normal.     Breath sounds: Normal breath sounds.  Abdominal:     General: Abdomen is flat. Bowel sounds are normal.     Tenderness: There is no abdominal tenderness.  Skin:    General: Skin is warm and dry.  Neurological:     General: No focal deficit present.     Mental Status: She is alert and oriented to person,  place, and time. Mental status is at baseline.     Gait: Gait normal.  Psychiatric:        Attention and Perception: Attention and perception normal.        Mood and Affect: Mood and affect normal.        Speech: Speech normal.        Behavior: Behavior normal. Behavior is cooperative.        Thought Content: Thought content normal.        Cognition and Memory: Cognition and memory normal.        Judgment: Judgment normal.     Assessment  Plan  Annual physical exam Flu shotutd Tdap had 03/03/11  Disc shingrix today  Per pt had MMR does not want checked  rec hep B vaccine  mammo 03/02/18 neg [place], 02/27/20 normal breast exam today  Pap s/p hysterectomy ovaries still intact not had pap x 5-6 years. Hysterectomy for bladder prolapse and endometriosis   Colonoscopy Dr. Alain Marion 03/13/10 logged in chart though no report due again 03/13/2020  S/p hemorrhoid surgery with Dr. Jamal Collin prolapsed hemorrhoids 2018 per pt -referred [facility]  DEXA will do age 62   HCV neg 05/01/15   Follows with Dr. Elmer Ramp bruise to toenail and tbse saw 05/21/18 will f/u in 1 year tbse10/2020 rec healthy diet and exercise   Hypothyroidism, unspecified type - Plan: levothyroxine (SYNTHROID) 75 MCG tablet  Hyperlipidemia, unspecified hyperlipidemia type - Plan: atorvastatin (LIPITOR) 10 MG tablet  Essential hypertension - Plan: amLODipine (NORVASC) 5 MG tablet, Comprehensive metabolic panel, Lipid panel, CBC with Differential/Platelet, TSH Monitor if elevated consider maxzide 37.5-25 1/2 dose or losartan 25 mg qd 1/2 dose   Onychomycosis of left great  toe - Plan: terbinafine (LAMISIL) 250 MG tablet x 3 months Hyperglycemia - Plan: Hemoglobin A1c  Vitamin D deficiency - Plan: Vitamin D (25 hydroxy)   [facility] f/u 03/22/19 Dr. Sabra Heck closed fx left wrist and fx tibial plateau out of work until f/u 04/14/19   05/13/19 [facility] PF stress syndrome right and left knee rec PT 2x per week  Closed fx of tibial plauteau right upper right light duty sitting only no driving 4 hrs per day x 2 weeks then 8 hrs per day x 2 weeks no driving until f/u in 4 weeks   Left wrist fracture hand   [facility] f/u 06/09/2019 Dr. Sabra Heck patellofemoral stress syndrome right and left knee. Closed fx of tibial plateau right, closed fx left wrist ocnt PT resume regular duty at work 06/13/2019 continue PT f/u in 8 weeks and do Xray right knee tunnel view   [facility] 08/04/19 Dr. Migdalia Dk 4.5 months s/p right knee lateral tibial plateau repair left wrist fx doing well mild pain right knee and rom improving rec home exercises and use brace walking on uneven ground rtc in 2 months xray   [facility] 09/30/19 fu in 3 months increase activity as tolerated Xray right knee with tunnel view Dr. Earnestine Leys   Provider: Dr. Olivia Mackie McLean-Scocuzza-[facility]

## 2020-03-07 LAB — URINALYSIS, ROUTINE W REFLEX MICROSCOPIC
Bilirubin Urine: NEGATIVE
Glucose, UA: NEGATIVE
Hgb urine dipstick: NEGATIVE
Ketones, ur: NEGATIVE
Leukocytes,Ua: NEGATIVE
Nitrite: NEGATIVE
Protein, ur: NEGATIVE
Specific Gravity, Urine: 1.006 (ref 1.001–1.03)
pH: 6.5 (ref 5.0–8.0)

## 2020-03-08 ENCOUNTER — Telehealth: Payer: Self-pay | Admitting: Internal Medicine

## 2020-03-08 NOTE — Telephone Encounter (Signed)
Patient informed and verbalized understanding.  See result note. °

## 2020-03-08 NOTE — Telephone Encounter (Signed)
Pt called back about lab results.  

## 2020-03-20 ENCOUNTER — Telehealth: Payer: Self-pay | Admitting: Internal Medicine

## 2020-03-20 NOTE — Telephone Encounter (Signed)
BP readings 124-140 mostly <130s/70s to 81 I am ok with these readings thank you for bring this in  Mail BP log to patient   [place]

## 2020-03-20 NOTE — Telephone Encounter (Signed)
Pt dropped off an envelope with BP readings in it. Placed in folder up front

## 2020-03-20 NOTE — Telephone Encounter (Signed)
Placed on your desk. 

## 2020-03-21 NOTE — Telephone Encounter (Signed)
Left message to return call. Log mailed.

## 2020-03-22 NOTE — Telephone Encounter (Signed)
Pt called back returning your call °

## 2020-03-23 NOTE — Telephone Encounter (Signed)
pt called back returning your call again  Cell number [phone]

## 2020-03-26 NOTE — Telephone Encounter (Signed)
Left message to return call. °Mychart message sent as well.  °

## 2020-03-30 ENCOUNTER — Telehealth: Payer: Self-pay | Admitting: Internal Medicine

## 2020-03-30 NOTE — Telephone Encounter (Signed)
Pt had her first shingles shot at [facility] in [place] today 8/27. She wanted Dr. Olivia Mackie to know.

## 2020-03-30 NOTE — Telephone Encounter (Signed)
Noted this has been documented in patient's immunization record.

## 2020-04-25 ENCOUNTER — Telehealth: Payer: Self-pay | Admitting: Internal Medicine

## 2020-04-25 NOTE — Telephone Encounter (Signed)
Placed on your desk. 

## 2020-04-25 NOTE — Telephone Encounter (Signed)
Patient dropped off BP readings. Bp readings are up front in Dr. Claris Gladden color folder.

## 2020-04-27 NOTE — Telephone Encounter (Signed)
Some BP readings >130/>80  125-141/72-86 mostly 70s   Does she want to try to add a medication?  maxzide 37.5-25 1/2 dose (has fluid pill in it) or losartan 25 mg qd 1/2 dose =12.5 mg daily ? For goal <130/<80 ?

## 2020-04-30 ENCOUNTER — Other Ambulatory Visit: Payer: Self-pay | Admitting: Internal Medicine

## 2020-04-30 DIAGNOSIS — I10 Essential (primary) hypertension: Secondary | ICD-10-CM

## 2020-04-30 MED ORDER — TRIAMTERENE-HCTZ 37.5-25 MG PO TABS: 0.5000 | ORAL_TABLET | Freq: Every day | ORAL | 3 refills | Status: DC

## 2020-04-30 NOTE — Telephone Encounter (Signed)
Patient informed and verbalized understanding.   Patient is agreeable to start Maxzide sent in to [facility] in [place].

## 2020-04-30 NOTE — Telephone Encounter (Signed)
Left message to return call 

## 2020-05-04 ENCOUNTER — Telehealth: Payer: Self-pay | Admitting: Internal Medicine

## 2020-05-04 ENCOUNTER — Other Ambulatory Visit: Payer: Self-pay | Admitting: Internal Medicine

## 2020-05-04 DIAGNOSIS — I10 Essential (primary) hypertension: Secondary | ICD-10-CM

## 2020-05-04 MED ORDER — TRIAMTERENE-HCTZ 37.5-25 MG PO TABS: 1.0000 | ORAL_TABLET | Freq: Every day | ORAL | 3 refills | Status: DC

## 2020-05-04 NOTE — Telephone Encounter (Signed)
Called and spoke with the Patient. States the day before taking the new pill BP was 139/79.  Since starting the pill BP has been  144/85 143/86 145/88  Spoke with Dr Olivia Mackie McLean-Scocuzza. States that pill should not cause this and is probably due to added stress with her mother's health. Would like for Patient to increase to full fluid pill and call back in on Monday.   Patient verbalized understanding

## 2020-05-04 NOTE — Telephone Encounter (Signed)
Left message to return call. Needing to know exactly what Patient's blood pressure has been.

## 2020-05-04 NOTE — Telephone Encounter (Signed)
See previous encounter

## 2020-05-04 NOTE — Telephone Encounter (Signed)
Patient was returning call about B/P

## 2020-05-04 NOTE — Telephone Encounter (Signed)
Patient called in stated that she is take the b/p medication that was added and her b/p has gone up higher and she stated that she is having some added stress about her mother being placed in hospice.

## 2020-05-07 NOTE — Telephone Encounter (Signed)
Patient called back stated that her pressure is still up 149/79, 144/91 but her ankles are not swollen now so she thinks that the first medication needs to be changed she stated that you could contact her on her cell phone [phone]

## 2020-05-21 ENCOUNTER — Other Ambulatory Visit: Payer: Self-pay | Admitting: Internal Medicine

## 2020-05-21 DIAGNOSIS — I10 Essential (primary) hypertension: Secondary | ICD-10-CM

## 2020-05-21 MED ORDER — LOSARTAN POTASSIUM-HCTZ 50-12.5 MG PO TABS: 0.5000 | ORAL_TABLET | Freq: Every day | ORAL | 1 refills | Status: DC

## 2020-05-21 NOTE — Telephone Encounter (Signed)
How is pt doing ?  What is her BP? If not at goal STOP maxzide 37.5-25 daily and start losartan 50-12.5 1/2 pill for 3 days and if BP not <130/<80 take 1 pill  Get a pill cutter or have the pharmacy cut in 1/2 pills Continue norvasc 5 mg daily   Sch bmet in 2 weeks  Let me know what BP is doing and drop off log in 2 weeks as well

## 2020-05-22 ENCOUNTER — Other Ambulatory Visit: Payer: Self-pay

## 2020-05-22 ENCOUNTER — Telehealth: Payer: Self-pay

## 2020-05-22 DIAGNOSIS — E785 Hyperlipidemia, unspecified: Secondary | ICD-10-CM

## 2020-05-22 MED ORDER — ATORVASTATIN CALCIUM 10 MG PO TABS: 10.0000 mg | ORAL_TABLET | Freq: Every day | ORAL | 3 refills | Status: DC

## 2020-05-22 NOTE — Telephone Encounter (Signed)
Refill already completed. Brand only is on RX.

## 2020-05-22 NOTE — Telephone Encounter (Signed)
Pt needs atorvastatin (LIPITOR) 10 MG tablet-Pt needs name brand ONLY LIPITORb

## 2020-05-22 NOTE — Telephone Encounter (Signed)
Left message to return call 

## 2020-05-25 ENCOUNTER — Telehealth: Payer: Self-pay

## 2020-05-25 DIAGNOSIS — E785 Hyperlipidemia, unspecified: Secondary | ICD-10-CM

## 2020-05-25 NOTE — Telephone Encounter (Signed)
Patient states the pharmacy would not fill in her Lipitor because it was not sent in for the brand name. She is allergic to the generic. Informed her that this was sent in with a note to pharmacy for brand only and that I will call to check.    Spoke with the pharmacy and gave verbal for Brand only. Pharmacy states that they do not have this med in stock but will fill this when it comes in.

## 2020-05-25 NOTE — Telephone Encounter (Signed)
Ok to continue with 1/2 pill losartan hctz and norvasc same dose daily BP looks better

## 2020-05-25 NOTE — Telephone Encounter (Signed)
Patient has switched medication and is taking the 1/2 pill.  Blood pressure has been 127/73 yesterday and 126/76 today.

## 2020-05-25 NOTE — Telephone Encounter (Signed)
Lindsay Carney, Lindsay Glow, MD to Me    TM  6:55 PM Note How is pt doing ?  What is her BP? If not at goal STOP maxzide 37.5-25 daily and start losartan 50-12.5 1/2 pill for 3 days and if BP not <130/<80 take 1 pill  Get a pill cutter or have the pharmacy cut in 1/2 pills Continue norvasc 5 mg daily   Sch bmet in 2 weeks  Let me know what BP is doing and drop off log in 2 weeks as well

## 2020-05-25 NOTE — Telephone Encounter (Signed)
Left message to return call. Unable to reach the Patient. Mychart message sent.

## 2020-05-25 NOTE — Telephone Encounter (Signed)
Pt returned your call.  

## 2020-06-05 ENCOUNTER — Other Ambulatory Visit: Payer: Self-pay

## 2020-06-08 ENCOUNTER — Encounter: Payer: Self-pay | Admitting: Internal Medicine

## 2020-06-08 ENCOUNTER — Other Ambulatory Visit: Payer: 59

## 2020-06-08 ENCOUNTER — Other Ambulatory Visit: Payer: Self-pay | Admitting: Internal Medicine

## 2020-06-08 ENCOUNTER — Other Ambulatory Visit: Payer: Self-pay

## 2020-06-08 ENCOUNTER — Ambulatory Visit: Payer: 59 | Admitting: Internal Medicine

## 2020-06-08 VITALS — BP 124/80 | HR 81 | Temp 81.0°F | Ht 62.21 in | Wt 181.4 lb

## 2020-06-08 DIAGNOSIS — B351 Tinea unguium: Secondary | ICD-10-CM

## 2020-06-08 DIAGNOSIS — I10 Essential (primary) hypertension: Secondary | ICD-10-CM

## 2020-06-08 LAB — COMPREHENSIVE METABOLIC PANEL
ALT: 14 U/L (ref 0–35)
AST: 14 U/L (ref 0–37)
Albumin: 4.6 g/dL (ref 3.5–5.2)
Alkaline Phosphatase: 87 U/L (ref 39–117)
BUN: 11 mg/dL (ref 6–23)
CO2: 29 mEq/L (ref 19–32)
Calcium: 9.4 mg/dL (ref 8.4–10.5)
Chloride: 104 mEq/L (ref 96–112)
Creatinine, Ser: 0.61 mg/dL (ref 0.40–1.20)
GFR: 95.92 mL/min (ref >60.00)
Glucose, Bld: 98 mg/dL (ref 70–99)
Potassium: 3.7 mEq/L (ref 3.5–5.1)
Sodium: 142 mEq/L (ref 135–145)
Total Bilirubin: 0.7 mg/dL (ref 0.2–1.2)
Total Protein: 6.7 g/dL (ref 6.0–8.3)

## 2020-06-08 MED ORDER — CICLOPIROX 8 % EX SOLN: Freq: Every day | CUTANEOUS | 2 refills | Status: DC

## 2020-06-08 MED ORDER — TERBINAFINE HCL 250 MG PO TABS: 250.0000 mg | ORAL_TABLET | Freq: Every day | ORAL | 0 refills | Status: DC

## 2020-06-08 MED ORDER — LOSARTAN POTASSIUM-HCTZ 50-12.5 MG PO TABS: 0.5000 | ORAL_TABLET | Freq: Every day | ORAL | 1 refills | Status: DC

## 2020-06-08 NOTE — Progress Notes (Signed)
Chief Complaint  Patient presents with  . Follow-up   F/u  1. BP improved on norvasc 5 mg qd and hyzaar 50/12.5 1/2 pill wam  2. Left great toenail fungus last day of lamisil check labs and wants to continue will give topical as well   Review of Systems  Constitutional: Negative for weight loss.  HENT: Negative for hearing loss.   Eyes: Negative for blurred vision.  Respiratory: Negative for shortness of breath.   Cardiovascular: Negative for chest pain.  Gastrointestinal: Negative for abdominal pain.  Musculoskeletal: Negative for falls and joint pain.  Skin: Negative for rash.  Neurological: Negative for headaches.  Psychiatric/Behavioral: Negative for depression.   Past Medical History:  Diagnosis Date  . Anemia    age 64  . Complication of anesthesia   . Diffuse cystic mastopathy   . Hiatal hernia   . Hyperlipidemia   . Hypertension   . Hypothyroidism   . PONV (postoperative nausea and vomiting)    nausea  . Right tibial fracture    03/2019 right lateral tibial plateau fracture repair Dr. Sabra Heck   . Thyroid disease 2011   Past Surgical History:  Procedure Laterality Date  . ABDOMINAL HYSTERECTOMY     bladder attached  . BLADDER SURGERY    . BREAST CYST ASPIRATION Right   . BREAST SURGERY Right 04/01/10  . CAST APPLICATION Left 04/11/3381   Procedure: CAST APPLICATION;  Surgeon: Earnestine Leys, MD;  Location: [facility] ORS;  Service: Orthopedics;  Laterality: Left;  . COLONOSCOPY  2011  . HEMORRHOID SURGERY N/A 03/12/2017   Procedure: INTERNAL AND EXTERNAL HEMORRHOIDECTOMY;  Surgeon: Christene Lye, MD;  Location: [facility];  Service: General;  Laterality: N/A;  . HEMORROIDECTOMY     lanced  . lipoma removal  1999   inner knee  . ORIF TIBIA PLATEAU Right 03/08/2019   Procedure: OPEN REDUCTION INTERNAL FIXATION (ORIF) TIBIAL PLATEAU;  Surgeon: Earnestine Leys, MD;  Location: [facility] ORS;  Service: Orthopedics;  Laterality: Right;  . TOE SURGERY     Family History   Problem Relation Age of Onset  . Arthritis Mother   . Hypertension Mother   . Stroke Mother        x2 bedridden x 2 years as of 06/2020  . Cancer Father        prostate and mouth  . Hyperlipidemia Father   . Diabetes Father   . Breast cancer Paternal Aunt   . Cancer Paternal Aunt    Social History   Socioeconomic History  . Marital status: Married    Spouse name: Not on file  . Number of children: Not on file  . Years of education: Not on file  . Highest education level: Not on file  Occupational History  . Not on file  Tobacco Use  . Smoking status: Never Smoker  . Smokeless tobacco: Never Used  Vaping Use  . Vaping Use: Never used  Substance and Sexual Activity  . Alcohol use: No  . Drug use: No  . Sexual activity: Not on file  Other Topics Concern  . Not on file  Social History Narrative   Married    Social Determinants of Health   Financial Resource Strain:   . Difficulty of Paying Living Expenses: Not on file  Food Insecurity:   . Worried About Charity fundraiser in the Last Year: Not on file  . Ran Out of Food in the Last Year: Not on file  Transportation Needs:   .  Lack of Transportation (Medical): Not on file  . Lack of Transportation (Non-Medical): Not on file  Physical Activity:   . Days of Exercise per Week: Not on file  . Minutes of Exercise per Session: Not on file  Stress:   . Feeling of Stress : Not on file  Social Connections:   . Frequency of Communication with Friends and Family: Not on file  . Frequency of Social Gatherings with Friends and Family: Not on file  . Attends Religious Services: Not on file  . Active Member of Clubs or Organizations: Not on file  . Attends Archivist Meetings: Not on file  . Marital Status: Not on file  Intimate Partner Violence:   . Fear of Current or Ex-Partner: Not on file  . Emotionally Abused: Not on file  . Physically Abused: Not on file  . Sexually Abused: Not on file   Current Meds   Medication Sig  . amLODipine (NORVASC) 5 MG tablet Take 1 tablet (5 mg total) by mouth daily.  Marland Kitchen atorvastatin (LIPITOR) 10 MG tablet Take 1 tablet (10 mg total) by mouth daily at 6 PM.  . cholecalciferol (VITAMIN D) 1000 UNITS tablet Take 1,000 Units by mouth daily with supper.   . levothyroxine (SYNTHROID) 75 MCG tablet TAKE 1 TABLET BY MOUTH ON EMPTY STOMACH 30-60 MIN BEFORE BREAKFAST  . losartan-hydrochlorothiazide (HYZAAR) 50-12.5 MG tablet Take 0.5 tablets by mouth daily. <130/<80. Stop Maxzide 37.5-25  . meloxicam (MOBIC) 15 MG tablet Take 15 mg by mouth daily as needed for pain.   . Multiple Vitamins-Minerals (ONE-A-DAY 50 PLUS) TABS Take 1 tablet by mouth daily with supper.   . Omega-3 Fatty Acids (FISH OIL) 1200 MG CAPS Take 1,200 mg by mouth daily with supper.   . polyethylene glycol powder (MIRALAX) powder Take 1 Container by mouth once.  Marland Kitchen SALINE NASAL SPRAY NA Place into the nose.  . terbinafine (LAMISIL) 250 MG tablet Take 1 tablet (250 mg total) by mouth daily.  . [DISCONTINUED] losartan-hydrochlorothiazide (HYZAAR) 50-12.5 MG tablet Take 0.5 tablets by mouth daily. Increase to 1 pill daily in the am if BP not at goal after 3 days goal <130/<80. Stop Maxzide 37.5-25   No Known Allergies No results found for this or any previous visit (from the past 2160 hour(s)). Objective  Body mass index is 32.95 kg/m. Wt Readings from Last 3 Encounters:  06/08/20 181 lb 6.4 oz (82.3 kg)  03/06/20 184 lb 3.2 oz (83.6 kg)  03/07/19 175 lb (79.4 kg)   Temp Readings from Last 3 Encounters:  06/08/20 (!) 81 F (27.2 C) (Oral)  03/06/20 98.5 F (36.9 C) (Oral)  03/10/19 99.2 F (37.3 C) (Oral)   BP Readings from Last 3 Encounters:  06/08/20 124/80  03/06/20 130/82  03/11/19 134/77   Pulse Readings from Last 3 Encounters:  06/08/20 81  03/06/20 74  03/11/19 86    Physical Exam Vitals and nursing note reviewed.  Constitutional:      Appearance: Normal appearance. She is  well-developed and well-groomed. She is obese.  HENT:     Head: Normocephalic and atraumatic.  Eyes:     Conjunctiva/sclera: Conjunctivae normal.     Pupils: Pupils are equal, round, and reactive to light.  Cardiovascular:     Rate and Rhythm: Normal rate and regular rhythm.     Heart sounds: Normal heart sounds. No murmur heard.   Pulmonary:     Effort: Pulmonary effort is normal.     Breath  sounds: Normal breath sounds.  Skin:    General: Skin is warm and dry.     Comments: Great toe onychomycosis   Neurological:     General: No focal deficit present.     Mental Status: She is alert and oriented to person, place, and time. Mental status is at baseline.     Gait: Gait normal.  Psychiatric:        Attention and Perception: Attention and perception normal.        Mood and Affect: Mood and affect normal.        Speech: Speech normal.        Behavior: Behavior normal. Behavior is cooperative.        Thought Content: Thought content normal.        Cognition and Memory: Cognition and memory normal.        Judgment: Judgment normal.     Assessment  Plan  Onychomycosis - Plan: Comprehensive metabolic panel, ciclopirox (PENLAC) 8 % solution Refill lamisil 3 months if labs normal lfts  Hypertension, unspecified type - Plan: losartan-hydrochlorothiazide (HYZAAR) 50-12.5 MG tablet 1/2 pill qam, norvasc 5 mg qd, Comprehensive metabolic panel  Monitor  Reviewed BP log today  HM Flu shotutd Tdap had 03/03/11  1/2 shingrix today  moderna 2/2  Per pt had MMR does not want checked  rec hep B vaccine  mammo 03/02/18 neg [place], 02/27/20 normal breast exam 2021   Pap s/p hysterectomy ovaries still intact not had pap x 5-6 years. Hysterectomy for bladder prolapse and endometriosis   Colonoscopy Dr. Alain Marion 03/13/10 logged in chart though no report due again 03/13/2020  S/p hemorrhoid surgery with Dr. Jamal Collin prolapsed hemorrhoids 2018 per pt -referred [facility] sch'ed 07/2020  DEXA  will do age 18   HCV neg 05/01/15   Follows with Dr. Elmer Ramp bruise to toenail and tbse saw 05/21/18 will f/u in 1 year tbse10/2020 rec healthy diet and exercise  provider: Dr. Olivia Mackie McLean-Scocuzza-[facility]

## 2020-06-08 NOTE — Patient Instructions (Signed)
Fungal Nail Infection A fungal nail infection is a common infection of the toenails or fingernails. This condition affects toenails more often than fingernails. It often affects the great, or big, toes. More than one nail may be infected. The condition can be passed from person to person (is contagious). What are the causes? This condition is caused by a fungus. Several types of fungi can cause the infection. These fungi are common in moist and warm areas. If your hands or feet come into contact with the fungus, it may get into a crack in your fingernail or toenail and cause the infection. What increases the risk? The following factors may make you more likely to develop this condition:  Being female.  Being of older age.  Living with someone who has the fungus.  Walking barefoot in areas where the fungus thrives, such as showers or locker rooms.  Wearing shoes and socks that cause your feet to sweat.  Having a nail injury or a recent nail surgery.  Having certain medical conditions, such as: ? Athlete's foot. ? Diabetes. ? Psoriasis. ? Poor circulation. ? A weak body defense system (immune system). What are the signs or symptoms? Symptoms of this condition include:  A pale spot on the nail.  Thickening of the nail.  A nail that becomes yellow or brown.  A brittle or ragged nail edge.  A crumbling nail.  A nail that has lifted away from the nail bed. How is this diagnosed? This condition is diagnosed with a physical exam. Your health care provider may take a scraping or clipping from your nail to test for the fungus. How is this treated? Treatment is not needed for mild infections. If you have significant nail changes, treatment may include:  Antifungal medicines taken by mouth (orally). You may need to take the medicine for several weeks or several months, and you may not see the results for a long time. These medicines can cause side effects. Ask your health care provider  what problems to watch for.  Antifungal nail polish or nail cream. These may be used along with oral antifungal medicines.  Laser treatment of the nail.  Surgery to remove the nail. This may be needed for the most severe infections. It can take a long time, usually up to a year, for the infection to go away. The infection may also come back. Follow these instructions at home: Medicines  Take or apply over-the-counter and prescription medicines only as told by your health care provider.  Ask your health care provider about using over-the-counter mentholated ointment on your nails. Nail care  Trim your nails often.  Wash and dry your hands and feet every day.  Keep your feet dry: ? Wear absorbent socks, and change your socks frequently. ? Wear shoes that allow air to circulate, such as sandals or canvas tennis shoes. Throw out old shoes.  Do not use artificial nails.  If you go to a nail salon, make sure you choose one that uses clean instruments.  Use antifungal foot powder on your feet and in your shoes. General instructions  Do not share personal items, such as towels or nail clippers.  Do not walk barefoot in shower rooms or locker rooms.  Wear rubber gloves if you are working with your hands in wet areas.  Keep all follow-up visits as told by your health care provider. This is important. Contact a health care provider if: Your infection is not getting better or it is getting worse   after several months. Summary  A fungal nail infection is a common infection of the toenails or fingernails.  Treatment is not needed for mild infections. If you have significant nail changes, treatment may include taking medicine orally and applying medicine to your nails.  It can take a long time, usually up to a year, for the infection to go away. The infection may also come back.  Take or apply over-the-counter and prescription medicines only as told by your health care  provider.  Follow instructions for taking care of your nails to help prevent infection from coming back or spreading. This information is not intended to replace advice given to you by your health care provider. Make sure you discuss any questions you have with your health care provider. Document Revised: 11/11/2018 Document Reviewed: 12/25/2017 Elsevier Patient Education  2020 Elsevier Inc.  

## 2020-06-13 ENCOUNTER — Telehealth: Payer: Self-pay | Admitting: Internal Medicine

## 2020-06-13 NOTE — Telephone Encounter (Signed)
Prior authorization has been submitted for patient's Lamisil   Awaiting approval or denial.

## 2020-06-13 NOTE — Telephone Encounter (Signed)
Prior authorization has been submitted for patient's Atorvastatin.   Awaiting approval or denial.

## 2020-06-18 NOTE — Telephone Encounter (Signed)
Lamisil denied. Re-submitted authorization with new information and sent to plan again.

## 2020-06-21 ENCOUNTER — Telehealth: Payer: Self-pay

## 2020-06-21 NOTE — Telephone Encounter (Signed)
Pt states that insurance is not going to cover terbinafine (LAMISIL) 250 MG tablet. She just used the [facility] card and it was $20 which she is fine with paying. She states that they will only cover 30 days at a time that way though. This is just a FYI. She got the 30 on 06/10/20

## 2020-06-21 NOTE — Telephone Encounter (Signed)
Denied and Patient was informed. She has been able to get this medication with a [employer] card for $20 for 30 days

## 2020-06-21 NOTE — Telephone Encounter (Signed)
Noted,  For your information  °

## 2020-06-21 NOTE — Telephone Encounter (Signed)
Noted  

## 2020-06-26 NOTE — Telephone Encounter (Signed)
She can only tolerate brand lipitor

## 2020-06-26 NOTE — Telephone Encounter (Signed)
Prior authorization has been denied for Lipitor 10 mg.   "The request for coverage for brand Lipitor 10mg  tablet, use as directed (30 per month), is denied. This decision is based on health plan criteria for brand Lipitor. This medicine is covered only if: Your doctor submits medical records documenting that you have had an inadequate response, failed, or cannot use all of the following (date and duration of trial must be provided): (A) Atorvastatin (generic Lipitor) (B) Rosuvastatin (generic Crestor). (C) Lovastatin (generic Mevacor). (D) Pravastatin (generic Pravachol). (E) Simvastatin (generic Zocor). The information provided does not show that you meet the criteria listed above."

## 2020-07-05 NOTE — Telephone Encounter (Signed)
Calling Patient's insurance to file an appeal

## 2020-07-09 NOTE — Telephone Encounter (Signed)
Spoke with [employer] and they sate we need to write a letter on why we are appealing the denial and fax this long with last office note to [phone].  Done

## 2020-07-10 NOTE — Telephone Encounter (Signed)
Faxed to [place] [employer] for [id number] faxed on 07-09-2020

## 2020-09-21 ENCOUNTER — Telehealth: Payer: Self-pay | Admitting: Internal Medicine

## 2020-09-21 DIAGNOSIS — I10 Essential (primary) hypertension: Secondary | ICD-10-CM

## 2020-09-21 DIAGNOSIS — E039 Hypothyroidism, unspecified: Secondary | ICD-10-CM

## 2020-09-21 MED ORDER — AMLODIPINE BESYLATE 5 MG PO TABS: 5.0000 mg | ORAL_TABLET | Freq: Every day | ORAL | 3 refills | Status: DC

## 2020-09-21 MED ORDER — LEVOTHYROXINE SODIUM 75 MCG PO TABS: ORAL_TABLET | ORAL | 3 refills | Status: DC

## 2020-09-21 NOTE — Telephone Encounter (Signed)
Patient needs the following refills; amLODipine (NORVASC) 5 MG tablet, levothyroxine (SYNTHROID) 75 MCG tablet. Patient  would like all medications to be filled at [facility] in [place].

## 2020-09-26 ENCOUNTER — Other Ambulatory Visit: Payer: Self-pay | Admitting: Internal Medicine

## 2020-09-26 DIAGNOSIS — I10 Essential (primary) hypertension: Secondary | ICD-10-CM

## 2020-10-03 ENCOUNTER — Telehealth: Payer: Self-pay | Admitting: Internal Medicine

## 2020-10-03 NOTE — Telephone Encounter (Signed)
Prior authorization has been submitted for patient's Lipitor Brand Name Only   Awaiting approval or denial.

## 2020-10-05 ENCOUNTER — Encounter: Payer: Self-pay | Admitting: Internal Medicine

## 2020-10-19 NOTE — Telephone Encounter (Signed)
Prior authorization has been denied.   "The request for coverage for brand Lipitor Tab 10mg , use as directed (90 tablets per 90 days), is denied. This decision is based on health plan criteria for brand Lipitor. This medicine is covered only if: Your doctor submits medical records documenting that you have had an inadequate response, failed, or cannot use all of the following (date and duration of trial must be provided): (A) Atorvastatin (generic Lipitor) (B) Rosuvastatin (generic Crestor). (C) Lovastatin (generic Mevacor). (D) Pravastatin (generic Pravachol). (E) Simvastatin (generic Zocor). The information provided does not show that you meet the criteria listed above. The reason(s) [facility] did not approve this medication can be found above. This denial is based on our Lipitor drug coverage policy, in addition to any supplementary information you or your prescriber may have submitted."

## 2020-10-19 NOTE — Telephone Encounter (Signed)
What does pt want to do inform they denied her request for brand  Will she pay for the cost of this?  Does she have brand lipitor?

## 2020-10-22 NOTE — Telephone Encounter (Signed)
Pt called returning your call. Please call pt back on cell number

## 2020-10-22 NOTE — Telephone Encounter (Signed)
Left message to return call 

## 2020-10-23 NOTE — Telephone Encounter (Signed)
Patient informed and verbalized understanding.  States she has a coupon card that allows her to get the brand name cheap.   Deleted [facility] from her chart. Patient now uses [facility] [place] due to insurance change.

## 2020-11-30 ENCOUNTER — Telehealth: Payer: Self-pay | Admitting: Internal Medicine

## 2020-11-30 NOTE — Telephone Encounter (Signed)
Prior authorization has been submitted for patient's Lipitor  Awaiting approval or denial.

## 2020-12-07 ENCOUNTER — Other Ambulatory Visit: Payer: Self-pay

## 2020-12-07 ENCOUNTER — Encounter: Payer: Self-pay | Admitting: Internal Medicine

## 2020-12-07 ENCOUNTER — Ambulatory Visit: Payer: 59 | Admitting: Internal Medicine

## 2020-12-07 VITALS — BP 126/82 | HR 79 | Temp 98.2°F | Ht 62.21 in | Wt 182.2 lb

## 2020-12-07 DIAGNOSIS — E785 Hyperlipidemia, unspecified: Secondary | ICD-10-CM

## 2020-12-07 DIAGNOSIS — E039 Hypothyroidism, unspecified: Secondary | ICD-10-CM | POA: Diagnosis not present

## 2020-12-07 DIAGNOSIS — R7303 Prediabetes: Secondary | ICD-10-CM | POA: Diagnosis not present

## 2020-12-07 DIAGNOSIS — I10 Essential (primary) hypertension: Secondary | ICD-10-CM | POA: Diagnosis not present

## 2020-12-07 DIAGNOSIS — Z1231 Encounter for screening mammogram for malignant neoplasm of breast: Secondary | ICD-10-CM

## 2020-12-07 LAB — CBC WITH DIFFERENTIAL/PLATELET
Basophils Absolute: 0 10*3/uL (ref 0.0–0.1)
Basophils Relative: 0.5 % (ref 0.0–3.0)
Eosinophils Absolute: 0.1 10*3/uL (ref 0.0–0.7)
Eosinophils Relative: 1 % (ref 0.0–5.0)
HCT: 41.4 % (ref 36.0–46.0)
Hemoglobin: 14.4 g/dL (ref 12.0–15.0)
Lymphocytes Relative: 25.2 % (ref 12.0–46.0)
Lymphs Abs: 1.4 10*3/uL (ref 0.7–4.0)
MCHC: 34.9 g/dL (ref 30.0–36.0)
MCV: 86.4 fl (ref 78.0–100.0)
Monocytes Absolute: 0.4 10*3/uL (ref 0.1–1.0)
Monocytes Relative: 6.3 % (ref 3.0–12.0)
Neutro Abs: 3.8 10*3/uL (ref 1.4–7.7)
Neutrophils Relative %: 67 % (ref 43.0–77.0)
Platelets: 256 10*3/uL (ref 150.0–400.0)
RBC: 4.79 Mil/uL (ref 3.87–5.11)
RDW: 13.2 % (ref 11.5–15.5)
WBC: 5.7 10*3/uL (ref 4.0–10.5)

## 2020-12-07 LAB — LIPID PANEL
Cholesterol: 193 mg/dL (ref 0–200)
HDL: 55.9 mg/dL (ref >39.00)
LDL Cholesterol: 117 mg/dL — ABNORMAL HIGH (ref 0–99)
NonHDL: 136.99
Total CHOL/HDL Ratio: 3
Triglycerides: 100 mg/dL (ref 0.0–149.0)
VLDL: 20 mg/dL (ref 0.0–40.0)

## 2020-12-07 LAB — COMPREHENSIVE METABOLIC PANEL
ALT: 16 U/L (ref 0–35)
AST: 14 U/L (ref 0–37)
Albumin: 4.6 g/dL (ref 3.5–5.2)
Alkaline Phosphatase: 92 U/L (ref 39–117)
BUN: 15 mg/dL (ref 6–23)
CO2: 31 mEq/L (ref 19–32)
Calcium: 9.7 mg/dL (ref 8.4–10.5)
Chloride: 104 mEq/L (ref 96–112)
Creatinine, Ser: 0.62 mg/dL (ref 0.40–1.20)
GFR: 95.21 mL/min (ref >60.00)
Glucose, Bld: 96 mg/dL (ref 70–99)
Potassium: 4.2 mEq/L (ref 3.5–5.1)
Sodium: 142 mEq/L (ref 135–145)
Total Bilirubin: 0.7 mg/dL (ref 0.2–1.2)
Total Protein: 6.9 g/dL (ref 6.0–8.3)

## 2020-12-07 LAB — TSH: TSH: 1.33 u[IU]/mL (ref 0.35–4.50)

## 2020-12-07 LAB — HEMOGLOBIN A1C: Hgb A1c MFr Bld: 5.7 % (ref 4.6–6.5)

## 2020-12-07 MED ORDER — ATORVASTATIN CALCIUM 10 MG PO TABS
10.0000 mg | ORAL_TABLET | Freq: Every day | ORAL | 3 refills | Status: DC
Start: 2020-12-07 — End: 2021-05-31

## 2020-12-07 MED ORDER — LOSARTAN POTASSIUM-HCTZ 50-12.5 MG PO TABS: 0.5000 | ORAL_TABLET | Freq: Every day | ORAL | 3 refills | Status: DC

## 2020-12-07 NOTE — Progress Notes (Signed)
Chief Complaint  Patient presents with  . Follow-up   F/u  1. Hypothyroidism on levo 75 mcg  2. htn improved on norvasc 5 mg qd, hyzaar 50-12.5 mg 1/2 pill qd  3. Prediabetes fasting for labs today  Her mom died November 25, 2020 and diet has not been the best but exercising 20 min 3-5 x per week on bike   Review of Systems  Constitutional: Negative for weight loss.  HENT: Negative for hearing loss.   Eyes: Negative for blurred vision.  Respiratory: Negative for shortness of breath.   Cardiovascular: Negative for chest pain.  Gastrointestinal: Negative for abdominal pain.  Musculoskeletal: Negative for falls and joint pain.  Skin: Negative for rash.  Neurological: Negative for headaches.  Psychiatric/Behavioral: Negative for depression.   Past Medical History:  Diagnosis Date  . Anemia    age 13  . Complication of anesthesia   . Diffuse cystic mastopathy   . Hiatal hernia   . Hyperlipidemia   . Hypertension   . Hypothyroidism   . PONV (postoperative nausea and vomiting)    nausea  . Right tibial fracture    03/2019 right lateral tibial plateau fracture repair Dr. Sabra Heck   . Thyroid disease 2011   Past Surgical History:  Procedure Laterality Date  . ABDOMINAL HYSTERECTOMY     bladder attached  . BLADDER SURGERY    . BREAST CYST ASPIRATION Right   . BREAST SURGERY Right 04/01/10  . CAST APPLICATION Left 10/03/5174   Procedure: CAST APPLICATION;  Surgeon: Earnestine Leys, MD;  Location: [facility] ORS;  Service: Orthopedics;  Laterality: Left;  . COLONOSCOPY  2011  . HEMORRHOID SURGERY N/A 03/12/2017   Procedure: INTERNAL AND EXTERNAL HEMORRHOIDECTOMY;  Surgeon: Christene Lye, MD;  Location: [facility] ORS;  Service: General;  Laterality: N/A;  . HEMORROIDECTOMY     lanced  . lipoma removal  1999   inner knee  . ORIF TIBIA PLATEAU Right 03/08/2019   Procedure: OPEN REDUCTION INTERNAL FIXATION (ORIF) TIBIAL PLATEAU;  Surgeon: Earnestine Leys, MD;  Location: [facility] ORS;  Service:  Orthopedics;  Laterality: Right;  . TOE SURGERY     Family History  Problem Relation Age of Onset  . Arthritis Mother   . Hypertension Mother   . Stroke Mother        x2 bedridden x 2 years as of 06/2020  . Cancer Father        prostate and mouth  . Hyperlipidemia Father   . Diabetes Father   . Breast cancer Paternal Aunt   . Cancer Paternal Aunt    Social History   Socioeconomic History  . Marital status: Married    Spouse name: Not on file  . Number of children: Not on file  . Years of education: Not on file  . Highest education level: Not on file  Occupational History  . Not on file  Tobacco Use  . Smoking status: Never Smoker  . Smokeless tobacco: Never Used  Vaping Use  . Vaping Use: Never used  Substance and Sexual Activity  . Alcohol use: No  . Drug use: No  . Sexual activity: Not on file  Other Topics Concern  . Not on file  Social History Narrative   Married    Social Determinants of Health   Financial Resource Strain: Not on file  Food Insecurity: Not on file  Transportation Needs: Not on file  Physical Activity: Not on file  Stress: Not on file  Social Connections: Not on file  Intimate Partner Violence: Not on file   Current Meds  Medication Sig  . amLODipine (NORVASC) 5 MG tablet TAKE 1 TABLET BY MOUTH EVERY DAY  . cholecalciferol (VITAMIN D) 1000 UNITS tablet Take 1,000 Units by mouth daily with supper.   . levothyroxine (SYNTHROID) 75 MCG tablet TAKE 1 TABLET BY MOUTH ON EMPTY STOMACH 30-60 MIN BEFORE BREAKFAST  . meloxicam (MOBIC) 15 MG tablet Take 15 mg by mouth daily as needed for pain.   . Multiple Vitamins-Minerals (ONE-A-DAY 50 PLUS) TABS Take 1 tablet by mouth daily with supper.   . Omega-3 Fatty Acids (FISH OIL) 1200 MG CAPS Take 1,200 mg by mouth daily with supper.   . polyethylene glycol powder (GLYCOLAX/MIRALAX) 17 GM/SCOOP powder Take 1 Container by mouth once.  Marland Kitchen SALINE NASAL SPRAY NA Place into the nose.  . [DISCONTINUED]  atorvastatin (LIPITOR) 10 MG tablet Take 1 tablet (10 mg total) by mouth daily at 6 PM.  . [DISCONTINUED] losartan-hydrochlorothiazide (HYZAAR) 50-12.5 MG tablet Take 0.5 tablets by mouth daily. <130/<80. Stop Maxzide 37.5-25   No Known Allergies No results found for this or any previous visit (from the past 2160 hour(s)). Objective  Body mass index is 33.1 kg/m. Wt Readings from Last 3 Encounters:  12/07/20 182 lb 3.2 oz (82.6 kg)  06/08/20 181 lb 6.4 oz (82.3 kg)  03/06/20 184 lb 3.2 oz (83.6 kg)   Temp Readings from Last 3 Encounters:  12/07/20 98.2 F (36.8 C) (Oral)  06/08/20 (!) 81 F (27.2 C) (Oral)  03/06/20 98.5 F (36.9 C) (Oral)   BP Readings from Last 3 Encounters:  12/07/20 126/82  06/08/20 124/80  03/06/20 130/82   Pulse Readings from Last 3 Encounters:  12/07/20 79  06/08/20 81  03/06/20 74    Physical Exam Vitals and nursing note reviewed.  Constitutional:      Appearance: Normal appearance. She is well-developed and well-groomed. She is obese.  HENT:     Head: Normocephalic and atraumatic.  Cardiovascular:     Rate and Rhythm: Normal rate and regular rhythm.     Heart sounds: Normal heart sounds. No murmur heard.   Pulmonary:     Effort: Pulmonary effort is normal.     Breath sounds: Normal breath sounds.  Abdominal:     Tenderness: There is no abdominal tenderness.  Skin:    General: Skin is warm and dry.  Neurological:     General: No focal deficit present.     Mental Status: She is alert and oriented to person, place, and time. Mental status is at baseline.     Gait: Gait normal.  Psychiatric:        Attention and Perception: Attention and perception normal.        Mood and Affect: Mood and affect normal.        Speech: Speech normal.        Behavior: Behavior normal. Behavior is cooperative.        Thought Content: Thought content normal.        Cognition and Memory: Cognition and memory normal.        Judgment: Judgment normal.      Assessment  Plan  Hyperlipidemia, unspecified hyperlipidemia type - Plan: Lipid panel, atorvastatin (LIPITOR) 10 MG tablet brand only   Hypertension, controlled - Plan: Comprehensive metabolic panel, Lipid panel, CBC with Differential/Platelet, losartan-hydrochlorothiazide (HYZAAR) 50-12.5 MG tablet 1/2 pill qd, norvasc 5 mg qd   Prediabetes - Plan: Hemoglobin A1c  Hypothyroidism, unspecified type -  Plan: TSH  Levo 75 mcg qd   HM at f/u cpe 03/2021  Flu shotutd Tdap had 03/03/11  1/2 shingrix utd 2/2 [facility]  moderna 3/3  Per pt had MMR does not want checked  rec hep B vaccine  mammo 03/02/18 neg [place], 02/27/20 normal breast exam 2021  Ordered   Pap s/p hysterectomy ovaries still intact not had pap x 5-6 years. Hysterectomy for bladder prolapse and endometriosis   Colonoscopy Dr. Alain Marion 03/13/10 logged in chart though no report due again 03/13/2020  S/p hemorrhoid surgery with Dr. Jamal Collin prolapsed hemorrhoids 2018 per pt -referred [facility] sch'ed 07/2020 f/u 10 years   DEXA will do age 75   HCV neg 05/01/15   Follows with Dr. Elmer Ramp bruise to toenail and tbse saw 05/21/18 will f/u in 1 year tbse10/2020 rec healthy diet and exercise  Provider: Dr. Olivia Mackie McLean-Scocuzza-Internal Medicine

## 2020-12-14 ENCOUNTER — Telehealth: Payer: Self-pay | Admitting: Internal Medicine

## 2020-12-14 NOTE — Telephone Encounter (Signed)
PT is calling to get results in regarding to her labs done.

## 2020-12-17 NOTE — Telephone Encounter (Signed)
Patient has seen results on mychart. Left message to return call with any further questions.  

## 2020-12-26 NOTE — Telephone Encounter (Signed)
Prior authorization has been denied.

## 2021-03-01 ENCOUNTER — Other Ambulatory Visit: Payer: Self-pay

## 2021-03-01 ENCOUNTER — Ambulatory Visit
Admission: RE | Admit: 2021-03-01 | Discharge: 2021-03-01 | Disposition: A | Discharge status: Home / Self Care | Payer: 59 | Source: Ambulatory Visit | Attending: Internal Medicine | Admitting: Internal Medicine

## 2021-03-01 DIAGNOSIS — Z1231 Encounter for screening mammogram for malignant neoplasm of breast: Secondary | ICD-10-CM | POA: Diagnosis present

## 2021-03-07 ENCOUNTER — Encounter: Payer: Self-pay | Admitting: Internal Medicine

## 2021-03-07 ENCOUNTER — Ambulatory Visit (INDEPENDENT_AMBULATORY_CARE_PROVIDER_SITE_OTHER): Payer: 59 | Admitting: Internal Medicine

## 2021-03-07 ENCOUNTER — Other Ambulatory Visit: Payer: Self-pay

## 2021-03-07 VITALS — BP 136/82 | HR 77 | Temp 96.5°F | Ht 62.36 in | Wt 186.6 lb

## 2021-03-07 DIAGNOSIS — Z0000 Encounter for general adult medical examination without abnormal findings: Secondary | ICD-10-CM | POA: Diagnosis not present

## 2021-03-07 DIAGNOSIS — E2839 Other primary ovarian failure: Secondary | ICD-10-CM

## 2021-03-07 DIAGNOSIS — Z1231 Encounter for screening mammogram for malignant neoplasm of breast: Secondary | ICD-10-CM | POA: Diagnosis not present

## 2021-03-07 DIAGNOSIS — Z1329 Encounter for screening for other suspected endocrine disorder: Secondary | ICD-10-CM

## 2021-03-07 DIAGNOSIS — I10 Essential (primary) hypertension: Secondary | ICD-10-CM

## 2021-03-07 DIAGNOSIS — R7303 Prediabetes: Secondary | ICD-10-CM

## 2021-03-07 DIAGNOSIS — Z23 Encounter for immunization: Secondary | ICD-10-CM

## 2021-03-07 DIAGNOSIS — E876 Hypokalemia: Secondary | ICD-10-CM

## 2021-03-07 DIAGNOSIS — Z1389 Encounter for screening for other disorder: Secondary | ICD-10-CM

## 2021-03-07 MED ORDER — TETANUS-DIPHTH-ACELL PERTUSSIS 5-2.5-18.5 LF-MCG/0.5 IM SUSP: 0.5000 mL | Freq: Once | INTRAMUSCULAR | 0 refills | Status: AC

## 2021-03-07 MED ORDER — POTASSIUM CHLORIDE CRYS ER 20 MEQ PO TBCR: 20.0000 meq | EXTENDED_RELEASE_TABLET | Freq: Every day | ORAL | 3 refills | Status: DC

## 2021-03-07 NOTE — Progress Notes (Signed)
Chief Complaint  Patient presents with   Annual Exam   Annual doing well  Htn sl elevated just took meds on norvsc 0.5 and losartan hctz 50-12.5 1/2 pill qd yesterday ankle swelling this am better with elevation was standing a lot yesterday   Review of Systems  Constitutional:  Negative for weight loss.  HENT:  Negative for hearing loss.   Eyes:  Negative for blurred vision.  Respiratory:  Negative for shortness of breath.   Cardiovascular:  Positive for leg swelling. Negative for chest pain.  Gastrointestinal:  Negative for abdominal pain.  Musculoskeletal:  Negative for joint pain.  Skin:  Negative for rash.  Neurological:  Negative for headaches.  Psychiatric/Behavioral:  Negative for depression.   Past Medical History:  Diagnosis Date   Anemia    age 65   Complication of anesthesia    Diffuse cystic mastopathy    Hiatal hernia    Hyperlipidemia    Hypertension    Hypothyroidism    PONV (postoperative nausea and vomiting)    nausea   Right tibial fracture    03/2019 right lateral tibial plateau fracture repair Dr. Hyacinth Meeker    Thyroid disease 2011   Past Surgical History:  Procedure Laterality Date   ABDOMINAL HYSTERECTOMY     bladder attached   BLADDER SURGERY     BREAST CYST ASPIRATION Right    BREAST SURGERY Right 04/01/10   CAST APPLICATION Left 03/08/2019   Procedure: CAST APPLICATION;  Surgeon: Deeann Saint, MD;  Location: [facility];  Service: Orthopedics;  Laterality: Left;   COLONOSCOPY  2011   HEMORRHOID SURGERY N/A 03/12/2017   Procedure: INTERNAL AND EXTERNAL HEMORRHOIDECTOMY;  Surgeon: Kieth Brightly, MD;  Location: [facility];  Service: General;  Laterality: N/A;   HEMORROIDECTOMY     lanced   lipoma removal  1999   inner knee   ORIF TIBIA PLATEAU Right 03/08/2019   Procedure: OPEN REDUCTION INTERNAL FIXATION (ORIF) TIBIAL PLATEAU;  Surgeon: Deeann Saint, MD;  Location: [facility];  Service: Orthopedics;  Laterality: Right;   TOE SURGERY     Family  History  Problem Relation Age of Onset   Arthritis Mother    Hypertension Mother    Stroke Mother        x2 bedridden x 2 years as of 06/2020   Cancer Father        prostate and mouth   Hyperlipidemia Father    Diabetes Father    Breast cancer Paternal Aunt    Cancer Paternal Aunt    Social History   Socioeconomic History   Marital status: Married    Spouse name: Not on file   Number of children: Not on file   Years of education: Not on file   Highest education level: Not on file  Occupational History   Not on file  Tobacco Use   Smoking status: Never   Smokeless tobacco: Never  Vaping Use   Vaping Use: Never used  Substance and Sexual Activity   Alcohol use: No   Drug use: No   Sexual activity: Not on file  Other Topics Concern   Not on file  Social History Narrative   Married    Social Determinants of Health   Financial Resource Strain: Not on file  Food Insecurity: Not on file  Transportation Needs: Not on file  Physical Activity: Not on file  Stress: Not on file  Social Connections: Not on file  Intimate Partner Violence: Not on file  Current Meds  Medication Sig   amLODipine (NORVASC) 5 MG tablet TAKE 1 TABLET BY MOUTH EVERY DAY   atorvastatin (LIPITOR) 10 MG tablet Take 1 tablet (10 mg total) by mouth daily at 6 PM.   cholecalciferol (VITAMIN D) 1000 UNITS tablet Take 1,000 Units by mouth daily with supper.    levothyroxine (SYNTHROID) 75 MCG tablet TAKE 1 TABLET BY MOUTH ON EMPTY STOMACH 30-60 MIN BEFORE BREAKFAST   losartan-hydrochlorothiazide (HYZAAR) 50-12.5 MG tablet Take 0.5 tablets by mouth daily. <130/<80. PLEASE CUT PILLS FOR PATIENT PREFERS SPECIFIC BRAND Stop Maxzide 37.5-25   meloxicam (MOBIC) 15 MG tablet Take 15 mg by mouth daily as needed for pain.    Multiple Vitamins-Minerals (ONE-A-DAY 50 PLUS) TABS Take 1 tablet by mouth daily with supper.    Omega-3 Fatty Acids (FISH OIL) 1200 MG CAPS Take 1,200 mg by mouth daily with supper.     polyethylene glycol powder (GLYCOLAX/MIRALAX) 17 GM/SCOOP powder Take 1 Container by mouth once.   potassium chloride SA (KLOR-CON) 20 MEQ tablet Take 1 tablet (20 mEq total) by mouth daily.   SALINE NASAL SPRAY NA Place into the nose.   No Known Allergies Recent Results (from the past 2160 hour(s))  Comprehensive metabolic panel     Status: None   Collection Time: 12/07/20  9:49 AM  Result Value Ref Range   Sodium 142 135 - 145 mEq/L   Potassium 4.2 3.5 - 5.1 mEq/L   Chloride 104 96 - 112 mEq/L   CO2 31 19 - 32 mEq/L   Glucose, Bld 96 70 - 99 mg/dL   BUN 15 6 - 23 mg/dL   Creatinine, Ser 0.62 0.40 - 1.20 mg/dL   Total Bilirubin 0.7 0.2 - 1.2 mg/dL   Alkaline Phosphatase 92 39 - 117 U/L   AST 14 0 - 37 U/L   ALT 16 0 - 35 U/L   Total Protein 6.9 6.0 - 8.3 g/dL   Albumin 4.6 3.5 - 5.2 g/dL   GFR 95.21 >60.00 mL/min    Comment: Calculated using the CKD-EPI Creatinine Equation (2021)   Calcium 9.7 8.4 - 10.5 mg/dL  Lipid panel     Status: Abnormal   Collection Time: 12/07/20  9:49 AM  Result Value Ref Range   Cholesterol 193 0 - 200 mg/dL    Comment: ATP III Classification       Desirable:  < 200 mg/dL               Borderline High:  200 - 239 mg/dL          High:  > = 240 mg/dL   Triglycerides 100.0 0.0 - 149.0 mg/dL    Comment: Normal:  <150 mg/dLBorderline High:  150 - 199 mg/dL   HDL 55.90 >39.00 mg/dL   VLDL 20.0 0.0 - 40.0 mg/dL   LDL Cholesterol 117 (H) 0 - 99 mg/dL   Total CHOL/HDL Ratio 3     Comment:                Men          Women1/2 Average Risk     3.4          3.3Average Risk          5.0          4.42X Average Risk          9.6          7.13X Average Risk  15.0          11.0                       NonHDL 136.99     Comment: NOTE:  Non-HDL goal should be 30 mg/dL higher than patient's LDL goal (i.e. LDL goal of < 70 mg/dL, would have non-HDL goal of < 100 mg/dL)  Hemoglobin A1c     Status: None   Collection Time: 12/07/20  9:49 AM  Result Value Ref Range    Hgb A1c MFr Bld 5.7 4.6 - 6.5 %    Comment: Glycemic Control Guidelines for People with Diabetes:Non Diabetic:  <6%Goal of Therapy: <7%Additional Action Suggested:  >8%   CBC with Differential/Platelet     Status: None   Collection Time: 12/07/20  9:49 AM  Result Value Ref Range   WBC 5.7 4.0 - 10.5 K/uL   RBC 4.79 3.87 - 5.11 Mil/uL   Hemoglobin 14.4 12.0 - 15.0 g/dL   HCT 41.4 36.0 - 46.0 %   MCV 86.4 78.0 - 100.0 fl   MCHC 34.9 30.0 - 36.0 g/dL   RDW 13.2 11.5 - 15.5 %   Platelets 256.0 150.0 - 400.0 K/uL   Neutrophils Relative % 67.0 43.0 - 77.0 %   Lymphocytes Relative 25.2 12.0 - 46.0 %   Monocytes Relative 6.3 3.0 - 12.0 %   Eosinophils Relative 1.0 0.0 - 5.0 %   Basophils Relative 0.5 0.0 - 3.0 %   Neutro Abs 3.8 1.4 - 7.7 K/uL   Lymphs Abs 1.4 0.7 - 4.0 K/uL   Monocytes Absolute 0.4 0.1 - 1.0 K/uL   Eosinophils Absolute 0.1 0.0 - 0.7 K/uL   Basophils Absolute 0.0 0.0 - 0.1 K/uL  TSH     Status: None   Collection Time: 12/07/20  9:49 AM  Result Value Ref Range   TSH 1.33 0.35 - 4.50 uIU/mL   Objective  Body mass index is 33.73 kg/m. Wt Readings from Last 3 Encounters:  03/07/21 186 lb 9.6 oz (84.6 kg)  12/07/20 182 lb 3.2 oz (82.6 kg)  06/08/20 181 lb 6.4 oz (82.3 kg)   Temp Readings from Last 3 Encounters:  03/07/21 (!) 96.5 F (35.8 C) (Temporal)  12/07/20 98.2 F (36.8 C) (Oral)  06/08/20 (!) 81 F (27.2 C) (Oral)   BP Readings from Last 3 Encounters:  03/07/21 136/82  12/07/20 126/82  06/08/20 124/80   Pulse Readings from Last 3 Encounters:  03/07/21 77  12/07/20 79  06/08/20 81    Physical Exam Vitals and nursing note reviewed.  Constitutional:      Appearance: Normal appearance. She is well-developed and well-groomed. She is obese.  HENT:     Head: Normocephalic and atraumatic.  Eyes:     Conjunctiva/sclera: Conjunctivae normal.     Pupils: Pupils are equal, round, and reactive to light.  Cardiovascular:     Rate and Rhythm: Normal  rate and regular rhythm.     Heart sounds: Normal heart sounds. No murmur heard. Pulmonary:     Effort: Pulmonary effort is normal.     Breath sounds: Normal breath sounds.  Abdominal:     Tenderness: There is no abdominal tenderness.  Skin:    General: Skin is warm and dry.  Neurological:     General: No focal deficit present.     Mental Status: She is alert and oriented to person, place, and time. Mental status is at baseline.  Gait: Gait normal.  Psychiatric:        Attention and Perception: Attention and perception normal.        Mood and Affect: Mood and affect normal.        Speech: Speech normal.        Behavior: Behavior normal. Behavior is cooperative.        Thought Content: Thought content normal.        Cognition and Memory: Cognition and memory normal.        Judgment: Judgment normal.    Assessment  Plan  Annual physical exam Flu shot will consider future Tdap had 03/03/11  2/2 shingrix utd 2/2 [facility]  moderna 4/4 Per pt had MMR does not want checked rec hep B vaccine   mammo 03/02/18 neg [place], 02/27/20 normal breast exam 2021  02/26/21 neg ordered 02/2022   Pap s/p hysterectomy ovaries still intact not had pap x 5-6 years. Hysterectomy for bladder prolapse and endometriosis   Colonoscopy Dr. Alain Marion 03/13/10 logged in chart though no report due again 03/13/2020 S/p hemorrhoid surgery with Dr. Jamal Collin prolapsed hemorrhoids 2018 per pt -referred [facility] sch'ed 07/2020  f/u 10 years  Need copy colonoscpy    DEXA 02/2022 with nex mammogram   HCV neg 05/01/15   Follows with Dr. Elmer Ramp bruise to toenail and tbse saw 05/21/18 will f/u in 1 year tbse 05/2019  rec healthy diet and exercise  Hypertension, unspecified type - Plan: Comprehensive metabolic panel, Lipid panel, CBC w/Diff 1 pill losartan hctz with potassium and stop norvasc 5 mg qd if continue ankle swelling this could be a side effect of norvasc   Hypokalemia - Plan: potassium  chloride SA (KLOR-CON) 20 MEQ tablet if increased losartan hctz 1/2 pill to 1 pill  Screening mammogram, encounter for - Plan: MM 3D SCREEN BREAST BILATERAL Estrogen deficiency - Plan: DG Bone Density Due 02/2022  Prediabetes - Plan: Hemoglobin A1c    Provider: Dr. Olivia Mackie McLean-Scocuzza-[facility]

## 2021-03-07 NOTE — Patient Instructions (Signed)
1 pill losartan hctz with potassium and stop norvasc 5 mg qd if continue ankle swelling this could be a side effect of norvasc  Edema  Edema is an abnormal buildup of fluids in the body tissues and under the skin. Swelling of the legs, feet, and ankles is a common symptom that becomes more likely as you get older. Swelling is also common in looser tissues, like around the eyes. When the affected area is squeezed, the fluid may move out of thatspot and leave a dent for a few moments. This dent is called pitting edema. There are many possible causes of edema. Eating too much salt (sodium) and being on your feet or sitting for a long time can cause edema in your legs, feet, and ankles. Hot weather may make edema worse. Common causes of edema include: Heart failure. Liver or kidney disease. Weak leg blood vessels. Cancer. An injury. Pregnancy. Medicines. Being obese. Low protein levels in the blood. Edema is usually painless. Your skin may look swollen or shiny. Follow these instructions at home: Keep the affected body part raised (elevated) above the level of your heart when you are sitting or lying down. Do not sit still or stand for long periods of time. Do not wear tight clothing. Do not wear garters on your upper legs. Exercise your legs to get your circulation going. This helps to move the fluid back into your blood vessels, and it may help the swelling go down. Wear elastic bandages or support stockings to reduce swelling as told by your health care provider. Eat a low-salt (low-sodium) diet to reduce fluid as told by your health care provider. Depending on the cause of your swelling, you may need to limit how much fluid you drink (fluid restriction). Take over-the-counter and prescription medicines only as told by your health care provider. Contact a health care provider if: Your edema does not get better with treatment. You have heart, liver, or kidney disease and have symptoms of  edema. You have sudden and unexplained weight gain. Get help right away if: You develop shortness of breath or chest pain. You cannot breathe when you lie down. You develop pain, redness, or warmth in the swollen areas. You have heart, liver, or kidney disease and suddenly get edema. You have a fever and your symptoms suddenly get worse. Summary Edema is an abnormal buildup of fluids in the body tissues and under the skin. Eating too much salt (sodium) and being on your feet or sitting for a long time can cause edema in your legs, feet, and ankles. Keep the affected body part raised (elevated) above the level of your heart when you are sitting or lying down. This information is not intended to replace advice given to you by your health care provider. Make sure you discuss any questions you have with your healthcare provider. Document Revised: 05/29/2020 Document Reviewed: 05/15/2020 Elsevier Patient Education  2022 [employer].

## 2021-03-13 ENCOUNTER — Telehealth: Payer: Self-pay | Admitting: Internal Medicine

## 2021-03-13 NOTE — Telephone Encounter (Signed)
Patient would like for Dr.Tracy to know that her medicine that she told her to stop taking half and take a whole pill is working well.Her blood pressure is good and her readings were 125/81 yesterday and 133/78 today.She started potassium as well and is ok on her refills.

## 2021-03-13 NOTE — Telephone Encounter (Signed)
For your information  

## 2021-03-15 NOTE — Telephone Encounter (Signed)
Noted  Dr. Gayland Curry

## 2021-05-31 ENCOUNTER — Other Ambulatory Visit: Payer: Self-pay | Admitting: Internal Medicine

## 2021-05-31 DIAGNOSIS — E785 Hyperlipidemia, unspecified: Secondary | ICD-10-CM

## 2021-06-10 ENCOUNTER — Telehealth: Payer: Self-pay | Admitting: Internal Medicine

## 2021-06-10 NOTE — Telephone Encounter (Signed)
Prior authorization has been submitted for patient's Lipitor brand name.   Awaiting approval or denial.

## 2021-06-12 NOTE — Telephone Encounter (Signed)
Pt called in regards to medication lipitor. Pt states the pharmacy she uses doesn't carry that specific brand anymore and the other pharmacies she has tried won't accept the insurance for it. Pt is requesting a completely new medicine for her cholesterol. 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    Pt also stated the medication losartan has not been changed to a full tablet, the pharmacy still filled it for the half tablet. Pt has filled this prescription but wanted to make sure the next time its ordered it is the correct dosage.   Pt uses [facility] in [place], [place] (C) [phone] (H)

## 2021-06-13 ENCOUNTER — Other Ambulatory Visit: Payer: Self-pay | Admitting: Internal Medicine

## 2021-06-13 ENCOUNTER — Other Ambulatory Visit: Payer: Self-pay

## 2021-06-13 DIAGNOSIS — E785 Hyperlipidemia, unspecified: Secondary | ICD-10-CM

## 2021-06-13 DIAGNOSIS — I10 Essential (primary) hypertension: Secondary | ICD-10-CM

## 2021-06-13 MED ORDER — LOSARTAN POTASSIUM-HCTZ 50-12.5 MG PO TABS
1.0000 | ORAL_TABLET | Freq: Every day | ORAL | 1 refills | Status: DC
Start: 2021-06-13 — End: 2021-09-13

## 2021-06-13 NOTE — Telephone Encounter (Signed)
Per last office note:  Hypertension, unspecified type - Plan: Comprehensive metabolic panel, Lipid panel, CBC w/Diff 1 pill losartan hctz with potassium and stop norvasc 5 mg qd if continue ankle swelling this could be a side effect of norvasc   Sent in updated Losartan-HCTZ to pharmacy and discontinued Norvasc from Patient list.   Please advise on cholesterol medication

## 2021-06-13 NOTE — Telephone Encounter (Addendum)
Patient's pharmacy supplier can no longer get Lipitor. Patient is needing to be switched to another brand. She can't take the generic form of Lipitor.    Patient also needs a script sent to the pharmacy for Losartan a whole tab not half.  [facility] in [place]

## 2021-06-14 NOTE — Telephone Encounter (Signed)
Please advise  on new cholesterol medication and labs

## 2021-06-14 NOTE — Telephone Encounter (Signed)
Pt needs a prescription for a generic version of LIPITOR pharmacy suggested Crestor. [facility] no longer carries Lipitor and she will have to pay more in order to get the medication from a different pharmacy. Pt states pharmacy did receive the prescription but it was for Lipitor not a generic version. Pt uses [facility] in [place].  Pt also would like to know will she need lab work done after starting a new prescription?   Pt states a good time to call her back would be 3pm or after.   [phone]

## 2021-06-14 NOTE — Telephone Encounter (Signed)
Losartan script already changed and sent in to pharmacy.   Please advise  on new cholesterol medication

## 2021-06-16 ENCOUNTER — Other Ambulatory Visit: Payer: Self-pay | Admitting: Internal Medicine

## 2021-06-16 DIAGNOSIS — E039 Hypothyroidism, unspecified: Secondary | ICD-10-CM

## 2021-06-17 ENCOUNTER — Encounter: Payer: Self-pay | Admitting: Internal Medicine

## 2021-06-17 NOTE — Telephone Encounter (Signed)
Sent my chart awaiting reply from patient

## 2021-06-18 NOTE — Telephone Encounter (Signed)
See 06/17/21 Patient message encounter

## 2021-06-19 NOTE — Telephone Encounter (Signed)
Pt called in stating that the pharmacy doesn't carry (Lipitor) anymore. Pt would like a different medication like (Crestor). Pt stated that she cant take (Lipitor) generic brand. Pt stated can Dr Aundra Dubin prescribe something else. Pt stated that she is not sure if insurance will cover name brand prescription. Pt would like callback

## 2021-06-20 ENCOUNTER — Other Ambulatory Visit: Payer: Self-pay | Admitting: Internal Medicine

## 2021-06-20 DIAGNOSIS — E785 Hyperlipidemia, unspecified: Secondary | ICD-10-CM

## 2021-06-20 MED ORDER — ROSUVASTATIN CALCIUM 10 MG PO TABS
10.0000 mg | ORAL_TABLET | Freq: Every day | ORAL | 3 refills | Status: DC
Start: 2021-06-20 — End: 2021-09-13

## 2021-06-20 NOTE — Telephone Encounter (Signed)
For your information, Patient agreeable to Crestor

## 2021-08-01 LAB — HM COLONOSCOPY

## 2021-09-13 ENCOUNTER — Encounter: Payer: Self-pay | Admitting: Internal Medicine

## 2021-09-13 ENCOUNTER — Ambulatory Visit: Payer: 59 | Admitting: Internal Medicine

## 2021-09-13 ENCOUNTER — Other Ambulatory Visit: Payer: Self-pay

## 2021-09-13 VITALS — BP 118/78 | HR 82 | Temp 98.1°F | Ht 62.0 in | Wt 187.8 lb

## 2021-09-13 DIAGNOSIS — R7303 Prediabetes: Secondary | ICD-10-CM

## 2021-09-13 DIAGNOSIS — E876 Hypokalemia: Secondary | ICD-10-CM | POA: Diagnosis not present

## 2021-09-13 DIAGNOSIS — Z1231 Encounter for screening mammogram for malignant neoplasm of breast: Secondary | ICD-10-CM

## 2021-09-13 DIAGNOSIS — E785 Hyperlipidemia, unspecified: Secondary | ICD-10-CM

## 2021-09-13 DIAGNOSIS — Z1389 Encounter for screening for other disorder: Secondary | ICD-10-CM

## 2021-09-13 DIAGNOSIS — Z23 Encounter for immunization: Secondary | ICD-10-CM | POA: Diagnosis not present

## 2021-09-13 DIAGNOSIS — I10 Essential (primary) hypertension: Secondary | ICD-10-CM

## 2021-09-13 DIAGNOSIS — E039 Hypothyroidism, unspecified: Secondary | ICD-10-CM

## 2021-09-13 LAB — CBC WITH DIFFERENTIAL/PLATELET
Basophils Absolute: 0 10*3/uL (ref 0.0–0.1)
Basophils Relative: 0.3 % (ref 0.0–3.0)
Eosinophils Absolute: 0.1 10*3/uL (ref 0.0–0.7)
Eosinophils Relative: 1 % (ref 0.0–5.0)
HCT: 43.6 % (ref 36.0–46.0)
Hemoglobin: 14.4 g/dL (ref 12.0–15.0)
Lymphocytes Relative: 19.5 % (ref 12.0–46.0)
Lymphs Abs: 1.2 10*3/uL (ref 0.7–4.0)
MCHC: 33.2 g/dL (ref 30.0–36.0)
MCV: 87.6 fl (ref 78.0–100.0)
Monocytes Absolute: 0.4 10*3/uL (ref 0.1–1.0)
Monocytes Relative: 6.4 % (ref 3.0–12.0)
Neutro Abs: 4.5 10*3/uL (ref 1.4–7.7)
Neutrophils Relative %: 72.8 % (ref 43.0–77.0)
Platelets: 258 10*3/uL (ref 150.0–400.0)
RBC: 4.97 Mil/uL (ref 3.87–5.11)
RDW: 13.5 % (ref 11.5–15.5)
WBC: 6.2 10*3/uL (ref 4.0–10.5)

## 2021-09-13 LAB — URINALYSIS, ROUTINE W REFLEX MICROSCOPIC
Bilirubin Urine: NEGATIVE
Hgb urine dipstick: NEGATIVE
Ketones, ur: NEGATIVE
Leukocytes,Ua: NEGATIVE
Nitrite: NEGATIVE
Specific Gravity, Urine: 1.01 (ref 1.000–1.030)
Total Protein, Urine: NEGATIVE
Urine Glucose: NEGATIVE
Urobilinogen, UA: 0.2 (ref 0.0–1.0)
pH: 6 (ref 5.0–8.0)

## 2021-09-13 LAB — COMPREHENSIVE METABOLIC PANEL
ALT: 15 U/L (ref 0–35)
AST: 14 U/L (ref 0–37)
Albumin: 4.5 g/dL (ref 3.5–5.2)
Alkaline Phosphatase: 91 U/L (ref 39–117)
BUN: 13 mg/dL (ref 6–23)
CO2: 33 mEq/L — ABNORMAL HIGH (ref 19–32)
Calcium: 9.7 mg/dL (ref 8.4–10.5)
Chloride: 102 mEq/L (ref 96–112)
Creatinine, Ser: 0.6 mg/dL (ref 0.40–1.20)
GFR: 95.45 mL/min (ref >60.00)
Glucose, Bld: 94 mg/dL (ref 70–99)
Potassium: 3.9 mEq/L (ref 3.5–5.1)
Sodium: 142 mEq/L (ref 135–145)
Total Bilirubin: 1 mg/dL (ref 0.2–1.2)
Total Protein: 6.8 g/dL (ref 6.0–8.3)

## 2021-09-13 LAB — HEMOGLOBIN A1C: Hgb A1c MFr Bld: 5.8 % (ref 4.6–6.5)

## 2021-09-13 LAB — TSH: TSH: 1.67 u[IU]/mL (ref 0.35–5.50)

## 2021-09-13 LAB — LIPID PANEL
Cholesterol: 169 mg/dL (ref 0–200)
HDL: 47.5 mg/dL (ref >39.00)
LDL Cholesterol: 88 mg/dL (ref 0–99)
NonHDL: 121.87
Total CHOL/HDL Ratio: 4
Triglycerides: 167 mg/dL — ABNORMAL HIGH (ref 0.0–149.0)
VLDL: 33.4 mg/dL (ref 0.0–40.0)

## 2021-09-13 MED ORDER — POTASSIUM CHLORIDE CRYS ER 20 MEQ PO TBCR: 20.0000 meq | EXTENDED_RELEASE_TABLET | Freq: Every day | ORAL | 3 refills | Status: DC

## 2021-09-13 MED ORDER — LEVOTHYROXINE SODIUM 75 MCG PO TABS: ORAL_TABLET | ORAL | 3 refills | Status: DC

## 2021-09-13 MED ORDER — LOSARTAN POTASSIUM-HCTZ 50-12.5 MG PO TABS: 1.0000 | ORAL_TABLET | Freq: Every day | ORAL | 3 refills | Status: DC

## 2021-09-13 MED ORDER — ROSUVASTATIN CALCIUM 10 MG PO TABS: 10.0000 mg | ORAL_TABLET | Freq: Every day | ORAL | 3 refills | Status: DC

## 2021-09-13 NOTE — Patient Instructions (Addendum)
Bone density and mammogram 03/03/22  Tdap (Tetanus, Diphtheria, Pertussis) Vaccine: What You Need to Know 1. Why get vaccinated? Tdap vaccine can prevent tetanus, diphtheria, and pertussis. Diphtheria and pertussis spread from person to person. Tetanus enters the body through cuts or wounds. TETANUS (T) causes painful stiffening of the muscles. Tetanus can lead to serious health problems, including being unable to open the mouth, having trouble swallowing and breathing, or death. DIPHTHERIA (D) can lead to difficulty breathing, heart failure, paralysis, or death. PERTUSSIS (aP), also known as "whooping cough," can cause uncontrollable, violent coughing that makes it hard to breathe, eat, or drink. Pertussis can be extremely serious especially in babies and young children, causing pneumonia, convulsions, brain damage, or death. In teens and adults, it can cause weight loss, loss of bladder control, passing out, and rib fractures from severe coughing. 2. Tdap vaccine Tdap is only for children 7 years and older, adolescents, and adults.  Adolescents should receive a single dose of Tdap, preferably at age 43 or 74 years. Pregnant people should get a dose of Tdap during every pregnancy, preferably during the early part of the third trimester, to help protect the newborn from pertussis. Infants are most at risk for severe, life-threatening complications from pertussis. Adults who have never received Tdap should get a dose of Tdap. Also, adults should receive a booster dose of either Tdap or Td (a different vaccine that protects against tetanus and diphtheria but not pertussis) every 10 years, or after 5 years in the case of a severe or dirty wound or burn. Tdap may be given at the same time as other vaccines. 3. Talk with your health care provider Tell your vaccine provider if the person getting the vaccine: Has had an allergic reaction after a previous dose of any vaccine that protects against tetanus,  diphtheria, or pertussis, or has any severe, life-threatening allergies Has had a coma, decreased level of consciousness, or prolonged seizures within 7 days after a previous dose of any pertussis vaccine (DTP, DTaP, or Tdap) Has seizures or another nervous system problem Has ever had Guillain-Barr Syndrome (also called "GBS") Has had severe pain or swelling after a previous dose of any vaccine that protects against tetanus or diphtheria In some cases, your health care provider may decide to postpone Tdap vaccination until a future visit. People with minor illnesses, such as a cold, may be vaccinated. People who are moderately or severely ill should usually wait until they recover before getting Tdap vaccine.  Your health care provider can give you more information. 4. Risks of a vaccine reaction Pain, redness, or swelling where the shot was given, mild fever, headache, feeling tired, and nausea, vomiting, diarrhea, or stomachache sometimes happen after Tdap vaccination. People sometimes faint after medical procedures, including vaccination. Tell your provider if you feel dizzy or have vision changes or ringing in the ears.  As with any medicine, there is a very remote chance of a vaccine causing a severe allergic reaction, other serious injury, or death. 5. What if there is a serious problem? An allergic reaction could occur after the vaccinated person leaves the clinic. If you see signs of a severe allergic reaction (hives, swelling of the face and throat, difficulty breathing, a fast heartbeat, dizziness, or weakness), call 9-1-1 and get the person to the nearest hospital. For other signs that concern you, call your health care provider.  Adverse reactions should be reported to the Vaccine Adverse Event Reporting System (VAERS). Your health care provider will  usually file this report, or you can do it yourself. Visit the VAERS website at www.vaers.SamedayNews.es or call 4328604902. VAERS is only for  reporting reactions, and VAERS staff members do not give medical advice. 6. The National Vaccine Injury Compensation Program The [employer] Vaccine Injury Compensation Program (VICP) is a federal program that was created to compensate people who may have been injured by certain vaccines. Claims regarding alleged injury or death due to vaccination have a time limit for filing, which may be as short as two years. Visit the VICP website at GoldCloset.com.ee or call [phone] to learn about the program and about filing a claim. 7. How can I learn more? Ask your health care provider. Call your local or state health department. Visit the website of the Food and Drug Administration (FDA) for vaccine package inserts and additional information at TraderRating.uy. Contact the Centers for Disease Control and Prevention (CDC): Call 605-818-1064 (1-800-CDC-INFO) or Visit CDC's website at http://hunter.com/. Vaccine Information Statement Tdap (Tetanus, Diphtheria, Pertussis) Vaccine (03/09/2020) This information is not intended to replace advice given to you by your health care provider. Make sure you discuss any questions you have with your health care provider. Document Revised: 04/04/2020 Document Reviewed: 04/04/2020 Elsevier Patient Education  2022 [employer].

## 2021-09-13 NOTE — Progress Notes (Signed)
Chief Complaint  Patient presents with   Follow-up   F/u  1. Htn controlled on hyzaar 50-12.5 mg qd  2. Hypothyroidism on levo 75 mcg  3. Hld crestor 10 mg qhs   Review of Systems  Constitutional:  Negative for weight loss.  HENT:  Negative for hearing loss.   Eyes:  Negative for blurred vision.  Respiratory:  Negative for shortness of breath.   Cardiovascular:  Negative for chest pain.  Gastrointestinal:  Negative for abdominal pain and blood in stool.  Genitourinary:  Negative for dysuria.  Musculoskeletal:  Negative for falls and joint pain.  Skin:  Negative for rash.  Neurological:  Negative for headaches.  Psychiatric/Behavioral:  Negative for depression.   Past Medical History:  Diagnosis Date   Anemia    age 66   Complication of anesthesia    Diffuse cystic mastopathy    Hiatal hernia    Hyperlipidemia    Hypertension    Hypothyroidism    PONV (postoperative nausea and vomiting)    nausea   Right tibial fracture    03/2019 right lateral tibial plateau fracture repair Dr. Sabra Heck    Thyroid disease 2011   Past Surgical History:  Procedure Laterality Date   ABDOMINAL HYSTERECTOMY     bladder attached   BLADDER SURGERY     BREAST CYST ASPIRATION Right    BREAST SURGERY Right 62/69/48   CAST APPLICATION Left 12/05/6268   Procedure: CAST APPLICATION;  Surgeon: Earnestine Leys, MD;  Location: [facility];  Service: Orthopedics;  Laterality: Left;   COLONOSCOPY  2011   HEMORRHOID SURGERY N/A 03/12/2017   Procedure: INTERNAL AND EXTERNAL HEMORRHOIDECTOMY;  Surgeon: Christene Lye, MD;  Location: [facility];  Service: General;  Laterality: N/A;   HEMORROIDECTOMY     lanced   lipoma removal  1999   inner knee   ORIF TIBIA PLATEAU Right 03/08/2019   Procedure: OPEN REDUCTION INTERNAL FIXATION (ORIF) TIBIAL PLATEAU;  Surgeon: Earnestine Leys, MD;  Location: [facility];  Service: Orthopedics;  Laterality: Right;   TOE SURGERY     Family History  Problem Relation Age of Onset    Arthritis Mother    Hypertension Mother    Stroke Mother        x2 bedridden x 2 years as of 06/2020   Cancer Father        prostate and mouth   Hyperlipidemia Father    Diabetes Father    Breast cancer Paternal Aunt    Cancer Paternal Aunt    Social History   Socioeconomic History   Marital status: Married    Spouse name: Not on file   Number of children: Not on file   Years of education: Not on file   Highest education level: Not on file  Occupational History   Not on file  Tobacco Use   Smoking status: Never   Smokeless tobacco: Never  Vaping Use   Vaping Use: Never used  Substance and Sexual Activity   Alcohol use: No   Drug use: No   Sexual activity: Not on file  Other Topics Concern   Not on file  Social History Narrative   Married    Social Determinants of Health   Financial Resource Strain: Not on file  Food Insecurity: Not on file  Transportation Needs: Not on file  Physical Activity: Not on file  Stress: Not on file  Social Connections: Not on file  Intimate Partner Violence: Not on file   Current Meds  Medication Sig   cholecalciferol (VITAMIN D) 1000 UNITS tablet Take 1,000 Units by mouth daily with supper.    meloxicam (MOBIC) 15 MG tablet Take 15 mg by mouth daily as needed for pain.    Multiple Vitamins-Minerals (ONE-A-DAY 50 PLUS) TABS Take 1 tablet by mouth daily with supper.    Omega-3 Fatty Acids (FISH OIL) 1200 MG CAPS Take 1,200 mg by mouth daily with supper.    polyethylene glycol powder (GLYCOLAX/MIRALAX) 17 GM/SCOOP powder Take 1 Container by mouth once.   SALINE NASAL SPRAY NA Place into the nose.   [DISCONTINUED] levothyroxine (SYNTHROID) 75 MCG tablet TAKE 1 TABLET BY MOUTH 30 TO 60 MINUTES BEFORE BREAKFAST ON AN EMPTY STOMACH   [DISCONTINUED] losartan-hydrochlorothiazide (HYZAAR) 50-12.5 MG tablet Take 1 tablet by mouth daily. <130/<80   [DISCONTINUED] potassium chloride SA (KLOR-CON) 20 MEQ tablet Take 1 tablet (20 mEq total) by  mouth daily.   [DISCONTINUED] rosuvastatin (CRESTOR) 10 MG tablet Take 1 tablet (10 mg total) by mouth daily. After 6 pm   No Known Allergies No results found for this or any previous visit (from the past 2160 hour(s)). Objective  Body mass index is 34.35 kg/m. Wt Readings from Last 3 Encounters:  09/13/21 187 lb 12.8 oz (85.2 kg)  03/07/21 186 lb 9.6 oz (84.6 kg)  12/07/20 182 lb 3.2 oz (82.6 kg)   Temp Readings from Last 3 Encounters:  09/13/21 98.1 F (36.7 C) (Oral)  03/07/21 (!) 96.5 F (35.8 C) (Temporal)  12/07/20 98.2 F (36.8 C) (Oral)   BP Readings from Last 3 Encounters:  09/13/21 118/78  03/07/21 136/82  12/07/20 126/82   Pulse Readings from Last 3 Encounters:  09/13/21 82  03/07/21 77  12/07/20 79    Physical Exam Vitals and nursing note reviewed.  Constitutional:      Appearance: Normal appearance. She is well-developed and well-groomed.  HENT:     Head: Normocephalic and atraumatic.  Eyes:     Conjunctiva/sclera: Conjunctivae normal.     Pupils: Pupils are equal, round, and reactive to light.  Cardiovascular:     Rate and Rhythm: Normal rate and regular rhythm.     Heart sounds: Normal heart sounds. No murmur heard. Pulmonary:     Effort: Pulmonary effort is normal.     Breath sounds: Normal breath sounds.  Abdominal:     General: Abdomen is flat. Bowel sounds are normal.     Tenderness: There is no abdominal tenderness.  Musculoskeletal:        General: No tenderness.  Skin:    General: Skin is warm and dry.  Neurological:     General: No focal deficit present.     Mental Status: She is alert and oriented to person, place, and time. Mental status is at baseline.     Cranial Nerves: Cranial nerves 2-12 are intact.     Motor: Motor function is intact.     Coordination: Coordination is intact.     Gait: Gait is intact.  Psychiatric:        Attention and Perception: Attention and perception normal.        Mood and Affect: Mood and affect  normal.        Speech: Speech normal.        Behavior: Behavior normal. Behavior is cooperative.        Thought Content: Thought content normal.        Cognition and Memory: Cognition and memory normal.  Judgment: Judgment normal.    Assessment  Plan  Hypertension, unspecified type - Plan: losartan-hydrochlorothiazide (HYZAAR) 50-12.5 MG tablet, Comprehensive metabolic panel, Lipid panel, CBC with Differential/Platelet  Hyperlipidemia, unspecified hyperlipidemia type - Plan: rosuvastatin (CRESTOR) 10 MG tablet, Lipid panel  Hypokalemia - Plan: potassium chloride SA (KLOR-CON M) 20 MEQ tablet  Hypothyroidism, unspecified type - Plan: levothyroxine (SYNTHROID) 75 MCG tablet, TSH  Prediabetes - Plan: Hemoglobin A1c   HM Flu shot will consider future Tdap had 03/03/11  given today 2/2 shingrix utd 2/2 [facility]  moderna 4/4 Per pt had MMR does not want checked rec hep B vaccine   mammo 03/02/18 neg [place], 02/27/20 normal breast exam 2021  02/26/21 neg ordered 02/2022   Pap s/p hysterectomy ovaries still intact not had pap x 5-6 years. Hysterectomy for bladder prolapse and endometriosis   Colonoscopy Dr. Alain Marion 03/13/10 logged in chart though no report due again 03/13/2020 S/p hemorrhoid surgery with Dr. Jamal Collin prolapsed hemorrhoids 2018 per pt -referred [facility] 07/2020  f/u 10 years  Need copy colonoscpy    DEXA 02/2022 with nex mammogram   HCV neg 05/01/15   Follows with Dr. Elmer Ramp bruise to toenail and tbse saw 05/21/18 will f/u in 1 year tbse 05/2019  rec healthy diet and exercise   Eye exam normal 2023  Dentist 09/10/21 normal   Provider: Dr. Olivia Mackie McLean-Scocuzza-Internal Medicine

## 2021-09-28 ENCOUNTER — Ambulatory Visit
Admission: EM | Admit: 2021-09-28 | Discharge: 2021-09-28 | Disposition: A | Discharge status: Home / Self Care | Payer: 59 | Attending: Emergency Medicine | Admitting: Emergency Medicine

## 2021-09-28 ENCOUNTER — Other Ambulatory Visit: Payer: Self-pay

## 2021-09-28 DIAGNOSIS — U071 COVID-19: Secondary | ICD-10-CM

## 2021-09-28 MED ORDER — BENZONATATE 100 MG PO CAPS: 200.0000 mg | ORAL_CAPSULE | Freq: Three times a day (TID) | ORAL | 0 refills | Status: DC

## 2021-09-28 MED ORDER — PROMETHAZINE-DM 6.25-15 MG/5ML PO SYRP: 5.0000 mL | ORAL_SOLUTION | Freq: Four times a day (QID) | ORAL | 0 refills | Status: DC | PRN

## 2021-09-28 MED ORDER — IPRATROPIUM BROMIDE 0.06 % NA SOLN: 2.0000 | Freq: Four times a day (QID) | NASAL | 12 refills | Status: DC

## 2021-09-28 MED ORDER — MOLNUPIRAVIR EUA 200MG CAPSULE: 4.0000 | ORAL_CAPSULE | Freq: Two times a day (BID) | ORAL | 0 refills | Status: AC

## 2021-09-28 NOTE — ED Provider Notes (Signed)
[facility]    CSN: [id number] Arrival date & time: 09/28/21  4332      History   Chief Complaint Chief Complaint  Patient presents with   Covid Positive    HPI Lindsay Carney is a 66 y.o. female.   HPI  66 year old female here for evaluation of respiratory complaints.  Patient reports that she started feel extremely fatigued yesterday and also spiked a fever so she test herself for COVID at home and was positive.  In addition to the above symptoms she also endorses nasal congestion and runny nose for clear nasal discharge, sore throat, nonproductive cough, headache, body aches.  She denies any GI symptoms.  Patient has been vaccinated and received 3 boosters.  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  Past Medical History:  Diagnosis Date   Anemia    age 48   Complication of anesthesia    Diffuse cystic mastopathy    Hiatal hernia    Hyperlipidemia    Hypertension    Hypothyroidism    PONV (postoperative nausea and vomiting)    nausea   Right tibial fracture    03/2019 right lateral tibial plateau fracture repair Dr. Sabra Heck    Thyroid disease 2011    Patient Active Problem List   Diagnosis Date Noted   Prediabetes 03/06/2020   Right tibial fracture 03/07/2019   Essential hypertension 09/08/2018   Elevated liver enzymes 05/21/2018   Lipoma 05/21/2018   Low back pain 03/13/2018   Hyperpigmented toenail lesion 03/13/2018   Annual physical exam 03/01/2018   Hypothyroidism 07/24/2014   HLD (hyperlipidemia) 07/24/2014   Constipation 07/24/2014   Hot flashes 07/24/2014   History of pituitary disease 07/24/2014   Obesity (BMI 30-39.9) 07/24/2014   History of fibrocystic disease of breast 03/01/2013    Past Surgical History:  Procedure Laterality Date   ABDOMINAL HYSTERECTOMY     bladder attached   BLADDER SURGERY     BREAST CYST ASPIRATION Right    BREAST SURGERY Right 95/18/84   CAST  APPLICATION Left 08/09/6061   Procedure: CAST APPLICATION;  Surgeon: Earnestine Leys, MD;  Location: [facility];  Service: Orthopedics;  Laterality: Left;   COLONOSCOPY  2011   HEMORRHOID SURGERY N/A 03/12/2017   Procedure: INTERNAL AND EXTERNAL HEMORRHOIDECTOMY;  Surgeon: Christene Lye, MD;  Location: [facility];  Service: General;  Laterality: N/A;   HEMORROIDECTOMY     lanced   lipoma removal  1999   inner knee   ORIF TIBIA PLATEAU Right 03/08/2019   Procedure: OPEN REDUCTION INTERNAL FIXATION (ORIF) TIBIAL PLATEAU;  Surgeon: Earnestine Leys, MD;  Location: [facility];  Service: Orthopedics;  Laterality: Right;   TOE SURGERY      OB History     Gravida  3   Para  1   Term      Preterm      AB  2   Living  1      SAB  2   IAB      Ectopic      Multiple      Live Births           Obstetric Comments  Menstrual age: 13  Age 1st Pregnancy: 42          Home Medications    Prior to Admission medications   Medication Sig Start Date End Date Taking? Authorizing Provider  benzonatate (TESSALON) 100 MG capsule Take 2  capsules (200 mg total) by mouth every 8 (eight) hours. 09/28/21  Yes Margarette Canada, NP  cholecalciferol (VITAMIN D) 1000 UNITS tablet Take 1,000 Units by mouth daily with supper.    Yes [provider]  ipratropium (ATROVENT) 0.06 % nasal spray Place 2 sprays into both nostrils 4 (four) times daily. 09/28/21  Yes Margarette Canada, NP  levothyroxine (SYNTHROID) 75 MCG tablet TAKE 1 TABLET BY MOUTH 30 TO 60 MINUTES BEFORE BREAKFAST ON AN EMPTY STOMACH 09/13/21  Yes McLean-Scocuzza, Nino Glow, MD  losartan-hydrochlorothiazide (HYZAAR) 50-12.5 MG tablet Take 1 tablet by mouth daily. <130/<80. Pt WANTS SAME BRAND OF ALL MEDICATIONS 09/13/21  Yes McLean-Scocuzza, Nino Glow, MD  meloxicam (MOBIC) 15 MG tablet Take 15 mg by mouth daily as needed for pain.  03/15/18  Yes [provider]  molnupiravir EUA (LAGEVRIO) 200 mg CAPS capsule Take 4 capsules (800 mg  total) by mouth 2 (two) times daily for 5 days. 09/28/21 10/03/21 Yes Margarette Canada, NP  Multiple Vitamins-Minerals (ONE-A-DAY 50 PLUS) TABS Take 1 tablet by mouth daily with supper.    Yes [provider]  Omega-3 Fatty Acids (FISH OIL) 1200 MG CAPS Take 1,200 mg by mouth daily with supper.    Yes [provider]  polyethylene glycol powder (GLYCOLAX/MIRALAX) 17 GM/SCOOP powder Take 1 Container by mouth once.   Yes [provider]  potassium chloride SA (KLOR-CON M) 20 MEQ tablet Take 1 tablet (20 mEq total) by mouth daily. 09/13/21  Yes McLean-Scocuzza, Nino Glow, MD  promethazine-dextromethorphan (PROMETHAZINE-DM) 6.25-15 MG/5ML syrup Take 5 mLs by mouth 4 (four) times daily as needed. 09/28/21  Yes Margarette Canada, NP  rosuvastatin (CRESTOR) 10 MG tablet Take 1 tablet (10 mg total) by mouth daily. After 6 pm 09/13/21  Yes McLean-Scocuzza, Nino Glow, MD  SALINE NASAL SPRAY NA Place into the nose.   Yes [provider]    Family History Family History  Problem Relation Age of Onset   Arthritis Mother    Hypertension Mother    Stroke Mother        x2 bedridden x 2 years as of 06/2020   Cancer Father        prostate and mouth   Hyperlipidemia Father    Diabetes Father    Breast cancer Paternal Aunt    Cancer Paternal Aunt     Social History Social History   Tobacco Use   Smoking status: Never   Smokeless tobacco: Never  Vaping Use   Vaping Use: Never used  Substance Use Topics   Alcohol use: No   Drug use: No     Allergies   Patient has no known allergies.   Review of Systems Review of Systems  Constitutional:  Positive for fever. Negative for activity change and appetite change.  HENT:  Positive for congestion, rhinorrhea and sore throat.   Respiratory:  Positive for cough.   Gastrointestinal:  Negative for diarrhea, nausea and vomiting.  Musculoskeletal:  Positive for arthralgias and myalgias.  Skin:  Negative for rash.  Neurological:   Positive for headaches.  Hematological: Negative.   Psychiatric/Behavioral: Negative.      Physical Exam Triage Vital Signs ED Triage Vitals  Enc Vitals Group     BP 09/28/21 0937 (!) 146/86     Pulse Rate 09/28/21 0937 87     Resp 09/28/21 0937 18     Temp 09/28/21 0937 99.3 F (37.4 C)     Temp Source 09/28/21 0937 Oral  SpO2 09/28/21 0937 97 %     Weight 09/28/21 0936 187 lb 12.8 oz (85.2 kg)     Height 09/28/21 0936 5\' 2"  (1.575 m)     Head Circumference --      Peak Flow --      Pain Score 09/28/21 0932 0     Pain Loc --      Pain Edu? --      Excl. in [place]? --    No data found.  Updated Vital Signs BP (!) 146/86 (BP Location: Right Arm)    Pulse 87    Temp 99.3 F (37.4 C) (Oral)    Resp 18    Ht 5\' 2"  (1.575 m)    Wt 187 lb 12.8 oz (85.2 kg)    SpO2 97%    BMI 34.35 kg/m   Visual Acuity Right Eye Distance:   Left Eye Distance:   Bilateral Distance:    Right Eye Near:   Left Eye Near:    Bilateral Near:     Physical Exam Vitals and nursing note reviewed.  Constitutional:      Appearance: Normal appearance. She is not ill-appearing.  HENT:     Head: Normocephalic and atraumatic.     Right Ear: Tympanic membrane, ear canal and external ear normal. There is no impacted cerumen.     Left Ear: Tympanic membrane, ear canal and external ear normal. There is no impacted cerumen.     Nose: Congestion and rhinorrhea present.     Mouth/Throat:     Mouth: Mucous membranes are moist.     Pharynx: Oropharynx is clear. Posterior oropharyngeal erythema present.  Cardiovascular:     Rate and Rhythm: Normal rate and regular rhythm.     Pulses: Normal pulses.     Heart sounds: Normal heart sounds. No murmur heard.   No friction rub. No gallop.  Pulmonary:     Effort: Pulmonary effort is normal.     Breath sounds: Normal breath sounds. No wheezing, rhonchi or rales.  Musculoskeletal:     Cervical back: Normal range of motion and neck supple.  Lymphadenopathy:      Cervical: No cervical adenopathy.  Skin:    General: Skin is warm and dry.     Capillary Refill: Capillary refill takes less than 2 seconds.     Findings: No erythema or rash.  Neurological:     General: No focal deficit present.     Mental Status: She is alert and oriented to person, place, and time.  Psychiatric:        Mood and Affect: Mood normal.        Behavior: Behavior normal.        Thought Content: Thought content normal.        Judgment: Judgment normal.     UC Treatments / Results  Labs (all labs ordered are listed, but only abnormal results are displayed) Labs Reviewed - No data to display  EKG   Radiology No results found.  Procedures Procedures (including critical care time)  Medications Ordered in UC Medications - No data to display  Initial Impression / Assessment and Plan / UC Course  I have reviewed the triage vital signs and the nursing notes.  Pertinent labs & imaging results that were available during my care of the patient were reviewed by me and considered in my medical decision making (see chart for details).  Patient is a pleasant, nontoxic-appearing 37 old female here for evaluation after testing  positive for COVID at home.  Her symptoms all began yesterday as outlined in HPI above.  On exam patient has pearly-gray tympanic membranes bilaterally with normal light reflex and clear external auditory canals.  Nasal mucosa is erythematous edematous with scant clear discharge in both nares.  Oropharyngeal exam reveals mild posterior oropharyngeal erythema and clear postnasal drip.  No cervical lymphadenopathy appreciable exam.  Cardiopulmonary exam feels clung sounds in all fields.  Patient does have a significant past medical history to include hypertension, hypothyroidism, and high cholesterol.  I will start her on molnupiravir twice daily for 5 days for treatment of COVID-19 as well as provide Atrovent nasal spray to help with nasal congestion, Tessalon  Perles help with cough during the day, and Promethazine DM cough syrup to help with cough at bedtime.  Return precautions reviewed with patient.  Work note provided.   Final Clinical Impressions(s) / UC Diagnoses   Final diagnoses:  [id number]     Discharge Instructions      You will have to quarantine for 5 days from the start of your symptoms.  After 5 days you can break quarantine if your symptoms have improved and you have not had a fever for 24 hours without taking Tylenol or ibuprofen.  Use over-the-counter Tylenol and ibuprofen as needed for body aches and fever.  Use the Atrovent nasal spray, 2 squirts in each nostril every 6 hours, as needed for runny nose and postnasal drip.  Use the Tessalon Perles every 8 hours during the day.  Take them with a small sip of water.  They may give you some numbness to the base of your tongue or a metallic taste in your mouth, this is normal.  Use the Promethazine DM cough syrup at bedtime for cough and congestion.  It will make you drowsy so do not take it during the day.  Take the molnupiravir twice daily for 5 days for treatment of COVID-19.  If you develop any increased shortness of breath-especially at rest, you are unable to speak in full sentences, or is a late sign your lips are turning blue you need to go the ER for evaluation.      ED Prescriptions     Medication Sig Dispense Auth. Provider   molnupiravir EUA (LAGEVRIO) 200 mg CAPS capsule Take 4 capsules (800 mg total) by mouth 2 (two) times daily for 5 days. 40 capsule Margarette Canada, NP   benzonatate (TESSALON) 100 MG capsule Take 2 capsules (200 mg total) by mouth every 8 (eight) hours. 21 capsule Margarette Canada, NP   ipratropium (ATROVENT) 0.06 % nasal spray Place 2 sprays into both nostrils 4 (four) times daily. 15 mL Margarette Canada, NP   promethazine-dextromethorphan (PROMETHAZINE-DM) 6.25-15 MG/5ML syrup Take 5 mLs by mouth 4 (four) times daily as needed. 118 mL Margarette Canada, NP       PDMP not reviewed this encounter.   Margarette Canada, NP 09/28/21 1041

## 2021-09-28 NOTE — ED Triage Notes (Signed)
Pt here with C/O testing positive for Covid last night, started feeling bad last night took temp was 101.4, took Covid test was positive.

## 2021-09-28 NOTE — Discharge Instructions (Signed)
You will have to quarantine for 5 days from the start of your symptoms.  After 5 days you can break quarantine if your symptoms have improved and you have not had a fever for 24 hours without taking Tylenol or ibuprofen.  Use over-the-counter Tylenol and ibuprofen as needed for body aches and fever.  Use the Atrovent nasal spray, 2 squirts in each nostril every 6 hours, as needed for runny nose and postnasal drip.  Use the Tessalon Perles every 8 hours during the day.  Take them with a small sip of water.  They may give you some numbness to the base of your tongue or a metallic taste in your mouth, this is normal.  Use the Promethazine DM cough syrup at bedtime for cough and congestion.  It will make you drowsy so do not take it during the day.  Take the molnupiravir twice daily for 5 days for treatment of COVID-19  If you develop any increased shortness of breath-especially at rest, you are unable to speak in full sentences, or is a late sign your lips are turning blue you need to go the ER for evaluation.  

## 2021-10-02 ENCOUNTER — Other Ambulatory Visit: Payer: Self-pay

## 2021-10-02 ENCOUNTER — Telehealth: Payer: Self-pay | Admitting: Internal Medicine

## 2021-10-02 ENCOUNTER — Telehealth (INDEPENDENT_AMBULATORY_CARE_PROVIDER_SITE_OTHER): Payer: 59 | Admitting: Internal Medicine

## 2021-10-02 ENCOUNTER — Encounter: Payer: Self-pay | Admitting: Internal Medicine

## 2021-10-02 VITALS — Ht 62.0 in | Wt 182.0 lb

## 2021-10-02 DIAGNOSIS — U071 COVID-19: Secondary | ICD-10-CM

## 2021-10-02 DIAGNOSIS — R0981 Nasal congestion: Secondary | ICD-10-CM | POA: Diagnosis not present

## 2021-10-02 MED ORDER — DM-GUAIFENESIN ER 60-1200 MG PO TB12: 1.0000 | ORAL_TABLET | Freq: Two times a day (BID) | ORAL | 0 refills | Status: DC | PRN

## 2021-10-02 MED ORDER — HYDROCOD POLI-CHLORPHE POLI ER 10-8 MG/5ML PO SUER: 5.0000 mL | Freq: Every evening | ORAL | 0 refills | Status: DC | PRN

## 2021-10-02 NOTE — Telephone Encounter (Signed)
Patient was scheduled and seen virtually today, 10/02/21 ?

## 2021-10-02 NOTE — Patient Instructions (Signed)

## 2021-10-02 NOTE — Progress Notes (Signed)
Patient tested positive On friday night and went to urgent Care on Saturday. Was given antiviral and cough medicine with a note to return to work on Thursday.  ? 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 Having productive cough and runny nose with green mucus. Patient running low grade fever, body aches, and fatigue. Patient needing an extended note for work. Urgent care would only write her out for 5 days due to [facility] guidelines.  ?

## 2021-10-02 NOTE — Telephone Encounter (Signed)
Pt tested positive covid on Friday with home test and went to urgent care on Saturday. Pt was given a return to work note by them for tomorrow. 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 Pt want get a work note for tomorrow and Friday by the provider. ?

## 2021-10-02 NOTE — Progress Notes (Signed)
Telephone Note ? ?I connected with Theo Reither ? on 10/02/21 at  1:20 PM EST by telephone application and verified that I am speaking with the correct person using two identifiers. ? Location patient: [place] ?Location provider:work or home office ?Persons participating in the virtual visit: patient, provider ? ?I discussed the limitations and requested verbal permission for telemedicine visit. The patient expressed understanding and agreed to proceed. ? ? ?HPI: ? ?Acute telemedicine visit for : ?Covid + Friday was having sore throat, h/awith fever 101.4 now fever last night and today 99 with tylenol/advil  ?Given tessalon perles, promethazine dm, molnupiravir bid x 5 days today is her last dose  ? ?She is not completely better feels weak ? ?-Pertinent past medical history: see below ?-Pertinent medication allergies:No Known Allergies ?-COVID-19 vaccine status:  ?Immunization History  ?Administered Date(s) Administered  ? Influenza,inj,Quad PF,6+ Mos 06/16/2017, 05/21/2018, 05/12/2019  ? Influenza-Unspecified 07/02/2014, 05/31/2015, 05/12/2020, 06/01/2021  ? Moderna Covid-19 Vaccine Bivalent Booster 67yrs & up 06/15/2021  ? Moderna SARS-COV2 Booster Vaccination 06/30/2020, 12/29/2020  ? Moderna Sars-Covid-2 Vaccination 10/01/2019, 10/29/2019  ? Tdap 03/03/2011, 09/13/2021  ? Zoster Recombinat (Shingrix) 03/30/2020, 05/12/2020, 08/03/2020  ? ? ? ?ROS: See pertinent positives and negatives per HPI. ? ?Past Medical History:  ?Diagnosis Date  ? Anemia   ? age 66  ? Complication of anesthesia   ? Diffuse cystic mastopathy   ? Hiatal hernia   ? Hyperlipidemia   ? Hypertension   ? Hypothyroidism   ? PONV (postoperative nausea and vomiting)   ? nausea  ? Right tibial fracture   ? 03/2019 right lateral tibial plateau fracture repair Dr. Sabra Heck   ? Thyroid disease 2011  ? ? ?Past Surgical History:  ?Procedure Laterality Date  ? ABDOMINAL HYSTERECTOMY    ? bladder attached  ? BLADDER SURGERY    ? BREAST CYST ASPIRATION Right   ?  BREAST SURGERY Right 04/01/10  ? CAST APPLICATION Left 4/0/9811  ? Procedure: CAST APPLICATION;  Surgeon: Earnestine Leys, MD;  Location: [facility];  Service: Orthopedics;  Laterality: Left;  ? COLONOSCOPY  2011  ? HEMORRHOID SURGERY N/A 03/12/2017  ? Procedure: INTERNAL AND EXTERNAL HEMORRHOIDECTOMY;  Surgeon: Christene Lye, MD;  Location: [facility];  Service: General;  Laterality: N/A;  ? HEMORROIDECTOMY    ? lanced  ? lipoma removal  1999  ? inner knee  ? ORIF TIBIA PLATEAU Right 03/08/2019  ? Procedure: OPEN REDUCTION INTERNAL FIXATION (ORIF) TIBIAL PLATEAU;  Surgeon: Earnestine Leys, MD;  Location: [facility];  Service: Orthopedics;  Laterality: Right;  ? TOE SURGERY    ? ? ? ?Current Outpatient Medications:  ?  benzonatate (TESSALON) 100 MG capsule, Take 2 capsules (200 mg total) by mouth every 8 (eight) hours., Disp: 21 capsule, Rfl: 0 ?  chlorpheniramine-HYDROcodone (TUSSIONEX PENNKINETIC ER) 10-8 MG/5ML, Take 5 mLs by mouth at bedtime as needed., Disp: 115 mL, Rfl: 0 ?  cholecalciferol (VITAMIN D) 1000 UNITS tablet, Take 1,000 Units by mouth daily with supper. , Disp: , Rfl:  ?  Dextromethorphan-Guaifenesin 60-1200 MG 12hr tablet, Take 1 tablet by mouth 2 (two) times daily as needed., Disp: 60 tablet, Rfl: 0 ?  ipratropium (ATROVENT) 0.06 % nasal spray, Place 2 sprays into both nostrils 4 (four) times daily., Disp: 15 mL, Rfl: 12 ?  levothyroxine (SYNTHROID) 75 MCG tablet, TAKE 1 TABLET BY MOUTH 30 TO 60 MINUTES BEFORE BREAKFAST ON AN EMPTY STOMACH, Disp: 90 tablet, Rfl: 3 ?  losartan-hydrochlorothiazide (HYZAAR) 50-12.5 MG tablet, Take  1 tablet by mouth daily. <130/<80. Pt WANTS SAME BRAND OF ALL MEDICATIONS, Disp: 90 tablet, Rfl: 3 ?  meloxicam (MOBIC) 15 MG tablet, Take 15 mg by mouth daily as needed for pain. , Disp: , Rfl: 0 ?  molnupiravir EUA (LAGEVRIO) 200 mg CAPS capsule, Take 4 capsules (800 mg total) by mouth 2 (two) times daily for 5 days., Disp: 40 capsule, Rfl: 0 ?  Multiple Vitamins-Minerals  (ONE-A-DAY 50 PLUS) TABS, Take 1 tablet by mouth daily with supper. , Disp: , Rfl:  ?  Omega-3 Fatty Acids (FISH OIL) 1200 MG CAPS, Take 1,200 mg by mouth daily with supper. , Disp: , Rfl:  ?  polyethylene glycol powder (GLYCOLAX/MIRALAX) 17 GM/SCOOP powder, Take 1 Container by mouth once., Disp: , Rfl:  ?  potassium chloride SA (KLOR-CON M) 20 MEQ tablet, Take 1 tablet (20 mEq total) by mouth daily., Disp: 90 tablet, Rfl: 3 ?  rosuvastatin (CRESTOR) 10 MG tablet, Take 1 tablet (10 mg total) by mouth daily. After 6 pm, Disp: 90 tablet, Rfl: 3 ?  SALINE NASAL SPRAY NA, Place into the nose., Disp: , Rfl:  ? ?EXAM: ? ?VITALS per patient if applicable: ? ?GENERAL: alert, oriented, appears well and in no acute distress ? ? ?LUNGS: +cough ?on inspection no signs of respiratory distress, breathing rate appears normal, no obvious gross SOB, gasping or wheezing ? ? ?PSYCH/NEURO: pleasant and cooperative, no obvious depression or anxiety, speech and thought processing grossly intact ? ?ASSESSMENT AND PLAN: ? ?Discussed the following assessment and plan: ? ?COVID-19 ? ?Nasal congestion ?Supportive care  ?If needing prescription strength medication we will need to make an appointment with a provider.  ?These are over the counter medication options:  ?Mucinex dm green label for cough or robitussin DM  ?Multivitamin or below vitamins  ?Vitamin C 1000 mg daily.  ?Vitamin D3 4000 Iu (units) daily.  ?Zinc 100 mg daily.  ?Quercetin 250-500 mg 2 times per day   ?Elderberry  ?Oil of oregano  ?cepacol or chloroseptic spray ?Warm salt water gargles +hydrogen peroxide ?Sugar free cough drops  ?Warm tea with honey and lemon  ?Hydration  ?Try to eat though you dont feel like it   ?Tylenol or Advil  ?Nasal saline and Flonase 2 sprays nasal congestion  ?If sneezing/runny nose over the counter allergy pill claritin,allegra, zyrtec, xyzal ?Quarantine x 10-14 days 14 days preferred  ? ?Monitor pulse oximeter, buy from [facility] if oxygen is less  than 90 please go to the hospital.  ?   ?   ?Are you feeling really sick? Shortness of breath, cough, chest pain?, dizziness? Confusion  ? If so let me know  ?If worsening, go to hospital or [facility] clinic Urgent care for further treatment.    ? ? ?-we discussed possible serious and likely etiologies, options for evaluation and workup, limitations of telemedicine visit vs in person visit, treatment, treatment risks and precautions. Pt is agreeable to treatment via telemedicine at this moment.  ?Work/School slipped offered: provided in patient instructions  yes ?Scheduled follow up with PCP offered: ?  ?I discussed the assessment and treatment plan with the patient. The patient was provided an opportunity to ask questions and all were answered. The patient agreed with the plan and demonstrated an understanding of the instructions. ?  ? ?Time spent 20 minutes ?Nino Glow McLean-Scocuzza, MD   ?

## 2021-11-15 ENCOUNTER — Other Ambulatory Visit: Payer: Self-pay | Admitting: Internal Medicine

## 2021-11-15 DIAGNOSIS — I10 Essential (primary) hypertension: Secondary | ICD-10-CM

## 2022-01-20 ENCOUNTER — Other Ambulatory Visit: Payer: Self-pay | Admitting: Internal Medicine

## 2022-01-20 DIAGNOSIS — E2839 Other primary ovarian failure: Secondary | ICD-10-CM

## 2022-03-07 ENCOUNTER — Ambulatory Visit
Admission: RE | Admit: 2022-03-07 | Discharge: 2022-03-07 | Disposition: A | Discharge status: Home / Self Care | Payer: 59 | Source: Ambulatory Visit | Attending: Internal Medicine | Admitting: Internal Medicine

## 2022-03-07 DIAGNOSIS — Z1231 Encounter for screening mammogram for malignant neoplasm of breast: Secondary | ICD-10-CM | POA: Insufficient documentation

## 2022-03-20 ENCOUNTER — Ambulatory Visit
Admission: RE | Admit: 2022-03-20 | Discharge: 2022-03-20 | Disposition: A | Discharge status: Home / Self Care | Payer: 59 | Source: Ambulatory Visit | Attending: Internal Medicine | Admitting: Internal Medicine

## 2022-03-20 ENCOUNTER — Other Ambulatory Visit: Payer: Self-pay | Admitting: Family

## 2022-03-20 DIAGNOSIS — E2839 Other primary ovarian failure: Secondary | ICD-10-CM | POA: Diagnosis not present

## 2022-03-20 DIAGNOSIS — M810 Age-related osteoporosis without current pathological fracture: Secondary | ICD-10-CM

## 2022-03-20 MED ORDER — ALENDRONATE SODIUM 70 MG PO TABS: 70.0000 mg | ORAL_TABLET | ORAL | 11 refills | Status: DC

## 2022-03-21 ENCOUNTER — Other Ambulatory Visit (INDEPENDENT_AMBULATORY_CARE_PROVIDER_SITE_OTHER): Payer: 59

## 2022-03-21 DIAGNOSIS — I10 Essential (primary) hypertension: Secondary | ICD-10-CM | POA: Diagnosis not present

## 2022-03-21 DIAGNOSIS — Z1389 Encounter for screening for other disorder: Secondary | ICD-10-CM | POA: Diagnosis not present

## 2022-03-21 DIAGNOSIS — R7303 Prediabetes: Secondary | ICD-10-CM

## 2022-03-21 DIAGNOSIS — Z1329 Encounter for screening for other suspected endocrine disorder: Secondary | ICD-10-CM | POA: Diagnosis not present

## 2022-03-21 LAB — LIPID PANEL
Cholesterol: 167 mg/dL (ref 0–200)
HDL: 45.1 mg/dL (ref >39.00)
LDL Cholesterol: 97 mg/dL (ref 0–99)
NonHDL: 121.45
Total CHOL/HDL Ratio: 4
Triglycerides: 124 mg/dL (ref 0.0–149.0)
VLDL: 24.8 mg/dL (ref 0.0–40.0)

## 2022-03-21 LAB — CBC WITH DIFFERENTIAL/PLATELET
Basophils Absolute: 0 10*3/uL (ref 0.0–0.1)
Basophils Relative: 0.7 % (ref 0.0–3.0)
Eosinophils Absolute: 0.1 10*3/uL (ref 0.0–0.7)
Eosinophils Relative: 1.6 % (ref 0.0–5.0)
HCT: 39.7 % (ref 36.0–46.0)
Hemoglobin: 13.7 g/dL (ref 12.0–15.0)
Lymphocytes Relative: 24.2 % (ref 12.0–46.0)
Lymphs Abs: 1.1 10*3/uL (ref 0.7–4.0)
MCHC: 34.5 g/dL (ref 30.0–36.0)
MCV: 85.9 fl (ref 78.0–100.0)
Monocytes Absolute: 0.4 10*3/uL (ref 0.1–1.0)
Monocytes Relative: 7.9 % (ref 3.0–12.0)
Neutro Abs: 3.1 10*3/uL (ref 1.4–7.7)
Neutrophils Relative %: 65.6 % (ref 43.0–77.0)
Platelets: 246 10*3/uL (ref 150.0–400.0)
RBC: 4.63 Mil/uL (ref 3.87–5.11)
RDW: 14 % (ref 11.5–15.5)
WBC: 4.7 10*3/uL (ref 4.0–10.5)

## 2022-03-21 LAB — COMPREHENSIVE METABOLIC PANEL
ALT: 26 U/L (ref 0–35)
AST: 16 U/L (ref 0–37)
Albumin: 4.4 g/dL (ref 3.5–5.2)
Alkaline Phosphatase: 78 U/L (ref 39–117)
BUN: 13 mg/dL (ref 6–23)
CO2: 28 mEq/L (ref 19–32)
Calcium: 8.9 mg/dL (ref 8.4–10.5)
Chloride: 102 mEq/L (ref 96–112)
Creatinine, Ser: 0.63 mg/dL (ref 0.40–1.20)
GFR: 93.99 mL/min (ref >60.00)
Glucose, Bld: 103 mg/dL — ABNORMAL HIGH (ref 70–99)
Potassium: 3.7 mEq/L (ref 3.5–5.1)
Sodium: 139 mEq/L (ref 135–145)
Total Bilirubin: 0.8 mg/dL (ref 0.2–1.2)
Total Protein: 6.5 g/dL (ref 6.0–8.3)

## 2022-03-21 LAB — URINALYSIS, ROUTINE W REFLEX MICROSCOPIC
Bilirubin Urine: NEGATIVE
Hgb urine dipstick: NEGATIVE
Ketones, ur: NEGATIVE
Leukocytes,Ua: NEGATIVE
Nitrite: NEGATIVE
Specific Gravity, Urine: 1.01 (ref 1.000–1.030)
Total Protein, Urine: NEGATIVE
Urine Glucose: NEGATIVE
Urobilinogen, UA: 0.2 (ref 0.0–1.0)
pH: 7 (ref 5.0–8.0)

## 2022-03-21 LAB — HEMOGLOBIN A1C: Hgb A1c MFr Bld: 5.9 % (ref 4.6–6.5)

## 2022-03-21 LAB — TSH: TSH: 2.37 u[IU]/mL (ref 0.35–5.50)

## 2022-03-28 ENCOUNTER — Encounter: Payer: Self-pay | Admitting: Internal Medicine

## 2022-03-28 ENCOUNTER — Ambulatory Visit: Payer: 59 | Admitting: Internal Medicine

## 2022-03-28 VITALS — BP 130/76 | HR 71 | Temp 98.0°F | Ht 62.0 in | Wt 191.0 lb

## 2022-03-28 DIAGNOSIS — Z0000 Encounter for general adult medical examination without abnormal findings: Secondary | ICD-10-CM

## 2022-03-28 DIAGNOSIS — T25032D Burn of unspecified degree of left toe(s) (nail), subsequent encounter: Secondary | ICD-10-CM

## 2022-03-28 DIAGNOSIS — Z1231 Encounter for screening mammogram for malignant neoplasm of breast: Secondary | ICD-10-CM | POA: Diagnosis not present

## 2022-03-28 DIAGNOSIS — I10 Essential (primary) hypertension: Secondary | ICD-10-CM

## 2022-03-28 DIAGNOSIS — E876 Hypokalemia: Secondary | ICD-10-CM

## 2022-03-28 DIAGNOSIS — T300 Burn of unspecified body region, unspecified degree: Secondary | ICD-10-CM

## 2022-03-28 DIAGNOSIS — E785 Hyperlipidemia, unspecified: Secondary | ICD-10-CM | POA: Diagnosis not present

## 2022-03-28 DIAGNOSIS — Z0001 Encounter for general adult medical examination with abnormal findings: Secondary | ICD-10-CM

## 2022-03-28 DIAGNOSIS — E039 Hypothyroidism, unspecified: Secondary | ICD-10-CM

## 2022-03-28 MED ORDER — MUPIROCIN 2 % EX OINT: 1.0000 | TOPICAL_OINTMENT | Freq: Two times a day (BID) | CUTANEOUS | 0 refills | Status: DC

## 2022-03-28 MED ORDER — LOSARTAN POTASSIUM-HCTZ 50-12.5 MG PO TABS: ORAL_TABLET | ORAL | 3 refills | Status: DC

## 2022-03-28 MED ORDER — ROSUVASTATIN CALCIUM 10 MG PO TABS: 10.0000 mg | ORAL_TABLET | Freq: Every day | ORAL | 3 refills | Status: DC

## 2022-03-28 MED ORDER — POTASSIUM CHLORIDE CRYS ER 20 MEQ PO TBCR: 20.0000 meq | EXTENDED_RELEASE_TABLET | Freq: Every day | ORAL | 3 refills | Status: DC

## 2022-03-28 MED ORDER — LEVOTHYROXINE SODIUM 75 MCG PO TABS: ORAL_TABLET | ORAL | 3 refills | Status: DC

## 2022-03-28 NOTE — Patient Instructions (Addendum)
Vitamin D3 rec 2000 IU daily  Calcium [phone] daily   Or  Multivitamin    Lindsay Carney [phone] retirement benefits specialist   PCP Dr. Volanda Napoleon
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
## 2022-04-09 ENCOUNTER — Encounter: Payer: Self-pay | Admitting: Internal Medicine

## 2022-09-01 ENCOUNTER — Telehealth: Payer: Self-pay | Admitting: Family Medicine

## 2022-09-01 DIAGNOSIS — E039 Hypothyroidism, unspecified: Secondary | ICD-10-CM

## 2022-09-01 DIAGNOSIS — E785 Hyperlipidemia, unspecified: Secondary | ICD-10-CM

## 2022-09-01 MED ORDER — LEVOTHYROXINE SODIUM 75 MCG PO TABS: ORAL_TABLET | ORAL | 0 refills | Status: DC

## 2022-09-01 MED ORDER — ROSUVASTATIN CALCIUM 10 MG PO TABS: 10.0000 mg | ORAL_TABLET | Freq: Every day | ORAL | 0 refills | Status: DC

## 2022-09-01 NOTE — Telephone Encounter (Signed)
30 nday supplyt sent enough to cover until appt with [facility] 2/29

## 2022-09-01 NOTE — Telephone Encounter (Signed)
Pt need a refill on Levothyroxine and rosuvastatin sent [facility] in [place]

## 2022-09-30 ENCOUNTER — Other Ambulatory Visit: Payer: Self-pay | Admitting: Family

## 2022-09-30 DIAGNOSIS — E039 Hypothyroidism, unspecified: Secondary | ICD-10-CM

## 2022-09-30 DIAGNOSIS — E785 Hyperlipidemia, unspecified: Secondary | ICD-10-CM

## 2022-10-02 ENCOUNTER — Ambulatory Visit: Payer: 59 | Admitting: Family Medicine

## 2022-10-02 ENCOUNTER — Encounter: Payer: Self-pay | Admitting: Family Medicine

## 2022-10-02 VITALS — BP 118/78 | Temp 98.1°F | Ht 63.0 in | Wt 191.8 lb

## 2022-10-02 DIAGNOSIS — I10 Essential (primary) hypertension: Secondary | ICD-10-CM | POA: Diagnosis not present

## 2022-10-02 DIAGNOSIS — E785 Hyperlipidemia, unspecified: Secondary | ICD-10-CM | POA: Diagnosis not present

## 2022-10-02 DIAGNOSIS — R7303 Prediabetes: Secondary | ICD-10-CM

## 2022-10-02 DIAGNOSIS — Z1231 Encounter for screening mammogram for malignant neoplasm of breast: Secondary | ICD-10-CM

## 2022-10-02 DIAGNOSIS — E669 Obesity, unspecified: Secondary | ICD-10-CM

## 2022-10-02 DIAGNOSIS — E039 Hypothyroidism, unspecified: Secondary | ICD-10-CM

## 2022-10-02 DIAGNOSIS — E876 Hypokalemia: Secondary | ICD-10-CM

## 2022-10-02 MED ORDER — POTASSIUM CHLORIDE CRYS ER 20 MEQ PO TBCR: 20.0000 meq | EXTENDED_RELEASE_TABLET | Freq: Every day | ORAL | 3 refills | Status: DC

## 2022-10-02 MED ORDER — ROSUVASTATIN CALCIUM 10 MG PO TABS: 10.0000 mg | ORAL_TABLET | Freq: Every day | ORAL | 0 refills | Status: DC

## 2022-10-02 MED ORDER — LOSARTAN POTASSIUM-HCTZ 50-12.5 MG PO TABS: ORAL_TABLET | ORAL | 3 refills | Status: DC

## 2022-10-02 MED ORDER — LEVOTHYROXINE SODIUM 75 MCG PO TABS: ORAL_TABLET | ORAL | 3 refills | Status: DC

## 2022-10-02 NOTE — Patient Instructions (Addendum)
It was a pleasure meeting you today. Thank you for allowing me to take part in your health care.  Our goals for today as we discussed include:  Schedule annual physical for August Schedule lab appointment 1 week prior to office visit.  Fast for 10 hours.  Referral sent for Mammogram. Please call to schedule appointment. [facility] [place], [place] [address] [phone]   Refills sent for requested medication  If you have any questions or concerns, please do not hesitate to call the office at [phone].  I look forward to our next visit and until then take care and stay safe.  Regards,   Carollee Leitz, MD   [facility]

## 2022-10-02 NOTE — Progress Notes (Signed)
SUBJECTIVE:   Chief Complaint  Patient presents with   Transitions Of Care   HPI Patient presents to clinic to transfer care.  No acute concerns today.  Hypothyroid Asymptomatic.  Compliant with current medications Synthroid 75 mcg daily.  Requesting refill  Hypertension Asymptomatic.  Home blood pressures 110s/70s.  Compliant with Hyzaar 50-12.5 mg daily.  Tolerating well.  Requesting refills for medication as well as potassium  Hyperlipidemia On statin therapy tolerating well.  No myalgias.  Requesting refill for Crestor 10 mg daily   PERTINENT PMH / PSH: Hyperlipidemia Hypertension Hypothyroid History of fibrocystic breast   OBJECTIVE:  BP 118/78   Temp 98.1 F (36.7 C) (Oral)   Ht '5\' 3"'$  (1.6 m)   Wt 191 lb 12.8 oz (87 kg)   SpO2 97%   PF 83 L/min   BMI 33.98 kg/m    Physical Exam Vitals reviewed.  Constitutional:      General: She is not in acute distress.    Appearance: She is not ill-appearing.  HENT:     Head: Normocephalic.     Nose: Nose normal.  Eyes:     Conjunctiva/sclera: Conjunctivae normal.  Neck:     Thyroid: No thyromegaly or thyroid tenderness.  Cardiovascular:     Rate and Rhythm: Normal rate and regular rhythm.     Heart sounds: Normal heart sounds.  Pulmonary:     Effort: Pulmonary effort is normal.     Breath sounds: Normal breath sounds.  Abdominal:     General: Abdomen is flat. Bowel sounds are normal.     Palpations: Abdomen is soft.  Musculoskeletal:        General: Normal range of motion.     Cervical back: Normal range of motion.  Neurological:     Mental Status: She is alert and oriented to person, place, and time. Mental status is at baseline.  Psychiatric:        Mood and Affect: Mood normal.        Behavior: Behavior normal.        Thought Content: Thought content normal.        Judgment: Judgment normal.     ASSESSMENT/PLAN:  Essential hypertension Assessment & Plan: Chronic.  Stable.  Well-controlled on  current antihypertensives Refill Hyzaar 50-12.5 mg daily Labs at next visit   Orders: -     Losartan Potassium-HCTZ; TAKE 1 TABLET BY MOUTH ONCE DAILY BLOOD PRESSURE<130/<80  Dispense: 90 tablet; Refill: 3 -     Potassium Chloride Crys ER; Take 1 tablet (20 mEq total) by mouth daily.  Dispense: 90 tablet; Refill: 3 -     Comprehensive metabolic panel; Future -     TSH; Future  Hypothyroidism, unspecified type Assessment & Plan: Chronic.  Stable. Refill Synthroid 75 mcg daily TSH at next visit  Orders: -     Levothyroxine Sodium; TAKE 1 TABLET BY MOUTH 30 TO 60 MINUTES BEFORE BREAKFAST ON AN EMPTY STOMACH  Dispense: 90 tablet; Refill: 3  Hyperlipidemia, unspecified hyperlipidemia type Assessment & Plan: Chronic. On statin therapy and tolerating well.  No myalgias Continue Crestor 10 mg daily Lipids at next visit  Orders: -     Lipid panel; Future  Breast cancer screening by mammogram -     3D Screening Mammogram, Left and Right; Future  Obesity (BMI 30.0-34.9) -     Hemoglobin A1c; Future -     CBC with Differential/Platelet; Future -     VITAMIN D 25 Hydroxy (Vit-D Deficiency,  Fractures); Future -     Vitamin B12; Future  Prediabetes Assessment & Plan: Checking A1c  Orders: -     Hemoglobin A1c; Future   PDMP reviewed  Return in about 6 months (around 04/01/2023) for annual visit with fasting labs 1 week prior.  Carollee Leitz, MD

## 2022-10-03 ENCOUNTER — Other Ambulatory Visit: Payer: Self-pay | Admitting: Family Medicine

## 2022-10-03 DIAGNOSIS — E785 Hyperlipidemia, unspecified: Secondary | ICD-10-CM

## 2022-10-05 ENCOUNTER — Encounter: Payer: Self-pay | Admitting: Family Medicine

## 2022-10-05 DIAGNOSIS — E66811 Obesity, class 1: Secondary | ICD-10-CM | POA: Insufficient documentation

## 2022-10-05 DIAGNOSIS — Z1231 Encounter for screening mammogram for malignant neoplasm of breast: Secondary | ICD-10-CM | POA: Insufficient documentation

## 2022-10-05 DIAGNOSIS — E669 Obesity, unspecified: Secondary | ICD-10-CM | POA: Insufficient documentation

## 2022-10-05 NOTE — Assessment & Plan Note (Signed)
Chronic.  Stable. Refill Synthroid 75 mcg daily TSH at next visit

## 2022-10-05 NOTE — Assessment & Plan Note (Signed)
Checking A1c.

## 2022-10-05 NOTE — Assessment & Plan Note (Signed)
Chronic. On statin therapy and tolerating well.  No myalgias Continue Crestor 10 mg daily Lipids at next visit

## 2022-10-05 NOTE — Assessment & Plan Note (Signed)
Chronic.  Stable.  Well-controlled on current antihypertensives Refill Hyzaar 50-12.5 mg daily Labs at next visit

## 2022-10-09 ENCOUNTER — Telehealth: Payer: Self-pay

## 2022-10-09 NOTE — Telephone Encounter (Signed)
Patient states her mammogram has been scheduled for 03/09/2023 at 9am.  Patient states she wanted to be sure Dr. Carollee Leitz had this information.

## 2022-12-02 ENCOUNTER — Other Ambulatory Visit: Payer: Self-pay

## 2022-12-02 DIAGNOSIS — I10 Essential (primary) hypertension: Secondary | ICD-10-CM

## 2022-12-02 MED ORDER — POTASSIUM CHLORIDE CRYS ER 20 MEQ PO TBCR: 20.0000 meq | EXTENDED_RELEASE_TABLET | Freq: Every day | ORAL | 3 refills | Status: DC

## 2022-12-19 ENCOUNTER — Telehealth: Payer: Self-pay | Admitting: Family Medicine

## 2022-12-19 NOTE — Telephone Encounter (Signed)
Prescription Request  12/19/2022  LOV: 10/02/2022  What is the name of the medication or equipment? losartan-hydrochlorothiazide (HYZAAR) 50-12.5 MG tablet   Please only fill for 30 days. Patient's insurance will be changing in July.   Have you contacted your pharmacy to request a refill? No   Which pharmacy would you like this sent to?   [facility] in [place]   Patient notified that their request is being sent to the clinical staff for review and that they should receive a response within 2 business days.   Please advise at Mobile [phone] (mobile)

## 2022-12-22 ENCOUNTER — Other Ambulatory Visit: Payer: Self-pay

## 2022-12-22 DIAGNOSIS — I10 Essential (primary) hypertension: Secondary | ICD-10-CM

## 2022-12-22 MED ORDER — LOSARTAN POTASSIUM-HCTZ 50-12.5 MG PO TABS: ORAL_TABLET | ORAL | 0 refills | Status: DC

## 2022-12-22 NOTE — Telephone Encounter (Signed)
[facility] [place] for a 30 day supply due to her Insurance.

## 2023-03-04 ENCOUNTER — Encounter (INDEPENDENT_AMBULATORY_CARE_PROVIDER_SITE_OTHER): Payer: Self-pay

## 2023-03-09 ENCOUNTER — Ambulatory Visit
Admission: RE | Admit: 2023-03-09 | Discharge: 2023-03-09 | Disposition: A | Discharge status: Home / Self Care | Payer: Medicare HMO | Source: Ambulatory Visit | Attending: Family Medicine | Admitting: Family Medicine

## 2023-03-09 DIAGNOSIS — Z1231 Encounter for screening mammogram for malignant neoplasm of breast: Secondary | ICD-10-CM | POA: Insufficient documentation

## 2023-03-25 ENCOUNTER — Other Ambulatory Visit: Payer: Medicare HMO

## 2023-03-25 DIAGNOSIS — E669 Obesity, unspecified: Secondary | ICD-10-CM

## 2023-03-25 DIAGNOSIS — E785 Hyperlipidemia, unspecified: Secondary | ICD-10-CM

## 2023-03-25 DIAGNOSIS — I10 Essential (primary) hypertension: Secondary | ICD-10-CM

## 2023-03-25 DIAGNOSIS — R7303 Prediabetes: Secondary | ICD-10-CM

## 2023-03-25 NOTE — Addendum Note (Signed)
Addended by: Jarvis Morgan D on: 03/25/2023 02:27 PM   Modules accepted: Orders

## 2023-03-26 ENCOUNTER — Other Ambulatory Visit (INDEPENDENT_AMBULATORY_CARE_PROVIDER_SITE_OTHER): Payer: Medicare HMO

## 2023-03-26 DIAGNOSIS — E669 Obesity, unspecified: Secondary | ICD-10-CM

## 2023-03-26 DIAGNOSIS — E785 Hyperlipidemia, unspecified: Secondary | ICD-10-CM | POA: Diagnosis not present

## 2023-03-26 DIAGNOSIS — I10 Essential (primary) hypertension: Secondary | ICD-10-CM | POA: Diagnosis not present

## 2023-03-26 DIAGNOSIS — R7303 Prediabetes: Secondary | ICD-10-CM | POA: Diagnosis not present

## 2023-03-26 LAB — CBC WITH DIFFERENTIAL/PLATELET
Basophils Absolute: 0 10*3/uL (ref 0.0–0.1)
Basophils Relative: 0.8 % (ref 0.0–3.0)
Eosinophils Absolute: 0.1 10*3/uL (ref 0.0–0.7)
Eosinophils Relative: 1.6 % (ref 0.0–5.0)
HCT: 40.1 % (ref 36.0–46.0)
Hemoglobin: 13.5 g/dL (ref 12.0–15.0)
Lymphocytes Relative: 24.3 % (ref 12.0–46.0)
Lymphs Abs: 1.2 10*3/uL (ref 0.7–4.0)
MCHC: 33.6 g/dL (ref 30.0–36.0)
MCV: 86.9 fl (ref 78.0–100.0)
Monocytes Absolute: 0.4 10*3/uL (ref 0.1–1.0)
Monocytes Relative: 7.5 % (ref 3.0–12.0)
Neutro Abs: 3.4 10*3/uL (ref 1.4–7.7)
Neutrophils Relative %: 65.8 % (ref 43.0–77.0)
Platelets: 243 10*3/uL (ref 150.0–400.0)
RBC: 4.62 Mil/uL (ref 3.87–5.11)
RDW: 13.8 % (ref 11.5–15.5)
WBC: 5.1 10*3/uL (ref 4.0–10.5)

## 2023-03-26 LAB — HEMOGLOBIN A1C: Hgb A1c MFr Bld: 5.7 % (ref 4.6–6.5)

## 2023-03-26 LAB — COMPREHENSIVE METABOLIC PANEL
ALT: 17 U/L (ref 0–35)
AST: 16 U/L (ref 0–37)
Albumin: 4.1 g/dL (ref 3.5–5.2)
Alkaline Phosphatase: 76 U/L (ref 39–117)
BUN: 12 mg/dL (ref 6–23)
CO2: 29 meq/L (ref 19–32)
Calcium: 8.9 mg/dL (ref 8.4–10.5)
Chloride: 103 meq/L (ref 96–112)
Creatinine, Ser: 0.57 mg/dL (ref 0.40–1.20)
GFR: 95.6 mL/min (ref >60.00)
Glucose, Bld: 100 mg/dL — ABNORMAL HIGH (ref 70–99)
Potassium: 3.6 meq/L (ref 3.5–5.1)
Sodium: 140 mEq/L (ref 135–145)
Total Bilirubin: 0.7 mg/dL (ref 0.2–1.2)
Total Protein: 6.2 g/dL (ref 6.0–8.3)

## 2023-03-26 LAB — LIPID PANEL
Cholesterol: 170 mg/dL (ref 0–200)
HDL: 41.8 mg/dL (ref >39.00)
LDL Cholesterol: 94 mg/dL (ref 0–99)
NonHDL: 128.62
Total CHOL/HDL Ratio: 4
Triglycerides: 174 mg/dL — ABNORMAL HIGH (ref 0.0–149.0)
VLDL: 34.8 mg/dL (ref 0.0–40.0)

## 2023-03-26 NOTE — Addendum Note (Signed)
Addended by: Jarvis Morgan D on: 03/26/2023 07:39 AM   Modules accepted: Orders

## 2023-03-27 LAB — TSH: TSH: 1.55 u[IU]/mL (ref 0.35–5.50)

## 2023-03-27 LAB — VITAMIN B12: Vitamin B-12: 360 pg/mL (ref 211–911)

## 2023-03-27 LAB — VITAMIN D 25 HYDROXY (VIT D DEFICIENCY, FRACTURES): VITD: 25.69 ng/mL — ABNORMAL LOW (ref 30.00–100.00)

## 2023-04-01 ENCOUNTER — Other Ambulatory Visit: Payer: Self-pay | Admitting: Family Medicine

## 2023-04-01 ENCOUNTER — Other Ambulatory Visit: Payer: Self-pay

## 2023-04-01 ENCOUNTER — Ambulatory Visit (INDEPENDENT_AMBULATORY_CARE_PROVIDER_SITE_OTHER): Payer: Medicare HMO | Admitting: Family Medicine

## 2023-04-01 ENCOUNTER — Encounter: Payer: Self-pay | Admitting: Family Medicine

## 2023-04-01 VITALS — BP 132/76 | HR 65 | Temp 98.2°F | Resp 16 | Ht 62.0 in | Wt 185.5 lb

## 2023-04-01 DIAGNOSIS — Z0000 Encounter for general adult medical examination without abnormal findings: Secondary | ICD-10-CM | POA: Diagnosis not present

## 2023-04-01 DIAGNOSIS — I10 Essential (primary) hypertension: Secondary | ICD-10-CM

## 2023-04-01 DIAGNOSIS — E038 Other specified hypothyroidism: Secondary | ICD-10-CM

## 2023-04-01 DIAGNOSIS — R7303 Prediabetes: Secondary | ICD-10-CM

## 2023-04-01 DIAGNOSIS — Z23 Encounter for immunization: Secondary | ICD-10-CM

## 2023-04-01 DIAGNOSIS — E785 Hyperlipidemia, unspecified: Secondary | ICD-10-CM

## 2023-04-01 MED ORDER — POTASSIUM CHLORIDE CRYS ER 20 MEQ PO TBCR
20.0000 meq | EXTENDED_RELEASE_TABLET | Freq: Every day | ORAL | 3 refills | Status: DC
Start: 2023-04-01 — End: 2024-03-24

## 2023-04-01 MED ORDER — LOSARTAN POTASSIUM-HCTZ 50-12.5 MG PO TABS
ORAL_TABLET | ORAL | 3 refills | Status: DC
Start: 2023-04-01 — End: 2023-04-01

## 2023-04-01 MED ORDER — LOSARTAN POTASSIUM-HCTZ 50-12.5 MG PO TABS: 1.0000 | ORAL_TABLET | Freq: Every day | ORAL | 3 refills | Status: DC

## 2023-04-01 NOTE — Progress Notes (Signed)
SUBJECTIVE:   Chief Complaint  Patient presents with   Annual Exam   HPI Patient presents to clinic for annual physical  No acute concerns today.  Hypothyroid Asymptomatic.  Compliant with Synthroid 75 mcg daily.   Hypertension Asymptomatic. No changed in home blood pressures.  Compliant with Hyzaar 50-12.5 mg daily.  Requesting refills for medication as well as potassium  Hyperlipidemia On statin therapy tolerating well.  No myalgias.  Requesting refill for Crestor 10 mg daily  Denies SI/HI  Has had increase in stressful events lately.  Dad passed within last month.  Sister in hospital.  Still grieving death of loved one.  Reports has been tough but able to manage. Has good social support.  Has had help from hospice care for grievance as well as her church members.  Denies any recent falls   PERTINENT PMH / PSH: Hyperlipidemia Hypertension Hypothyroid History of fibrocystic breast   OBJECTIVE:  BP 132/76   Pulse 65   Temp 98.2 F (36.8 C)   Resp 16   Ht 5\' 2"  (1.575 m)   Wt 185 lb 8 oz (84.1 kg)   SpO2 98%   BMI 33.93 kg/m    Physical Exam Vitals reviewed.  Constitutional:      General: She is not in acute distress.    Appearance: She is not ill-appearing.  HENT:     Head: Normocephalic.     Nose: Nose normal.  Eyes:     Conjunctiva/sclera: Conjunctivae normal.  Neck:     Thyroid: No thyromegaly or thyroid tenderness.  Cardiovascular:     Rate and Rhythm: Normal rate and regular rhythm.     Heart sounds: Normal heart sounds.  Pulmonary:     Effort: Pulmonary effort is normal.     Breath sounds: Normal breath sounds.  Abdominal:     General: Abdomen is flat. Bowel sounds are normal.     Palpations: Abdomen is soft.  Musculoskeletal:        General: Normal range of motion.     Cervical back: Normal range of motion.  Neurological:     Mental Status: She is alert and oriented to person, place, and time. Mental status is at baseline.   Psychiatric:        Mood and Affect: Mood normal.        Behavior: Behavior normal.        Thought Content: Thought content normal.        Judgment: Judgment normal.       04/01/2023    9:17 AM 10/02/2022    3:22 PM 03/28/2022    8:09 AM 03/07/2021    7:39 AM 03/06/2020    9:01 AM  Depression screen PHQ 2/9  Decreased Interest 0 0 0 0 0  Down, Depressed, Hopeless 0 0 0 0 0  PHQ - 2 Score 0 0 0 0 0  Altered sleeping 0    0  Tired, decreased energy 0    0  Change in appetite 0    0  Feeling bad or failure about yourself  0    0  Trouble concentrating 0    0  Moving slowly or fidgety/restless 0    0  Suicidal thoughts 0    0  PHQ-9 Score 0    0  Difficult doing work/chores Not difficult at all    Not difficult at all       04/01/2023    9:17 AM 10/02/2022    3:22  PM 03/06/2020    9:01 AM  GAD 7 : Generalized Anxiety Score  Nervous, Anxious, on Edge 0 0 0  Control/stop worrying 0 0 0  Worry too much - different things 0 0 0  Trouble relaxing 0 0 0  Restless 0 0 0  Easily annoyed or irritable 0 0 0  Afraid - awful might happen 0 0 0  Total GAD 7 Score 0 0 0  Anxiety Difficulty Not difficult at all Not difficult at all Not difficult at all      ASSESSMENT/PLAN:  Annual physical exam Assessment & Plan: Mammogram up to date.  Due 03/2024 Recommend regular self breast exams No indication for PAP given TAH Colonoscopy up to date.  Due 2032 Tetanus up to date PNA 20 administered today Shingles x 2 completed Hep C/HIV screening completed PHQ9/GAD screening completed Low risk falls Dexa completed 03/2022.  T score -1.9.  Osteopenia  FRAX15.8% and 2.1% 10 yr probability.  Currently on Vitamin d supplements and has plenty calcium in diet. Flu vaccine when available. Welcome to Medicare visit to be scheduled in near future.   Essential hypertension Assessment & Plan: Chronic.  Stable.  Well-controlled on current antihypertensives Continue Hyzaar 50-12.5 mg daily Cr  wnl   Orders: -     Potassium Chloride Crys ER; Take 1 tablet (20 mEq total) by mouth daily.  Dispense: 90 tablet; Refill: 3  Need for pneumococcal 20-valent conjugate vaccination -     Pneumococcal conjugate vaccine 20-valent  Hyperlipidemia, unspecified hyperlipidemia type Assessment & Plan: Chronic. LDL at goal On statin therapy and tolerating well.  No myalgias Refill Crestor 10 mg daily   Orders: -     Rosuvastatin Calcium; Take 1 tablet (10 mg total) by mouth daily.  Dispense: 90 tablet; Refill: 3  Other specified hypothyroidism Assessment & Plan: Chronic.  Stable. Recent TSH normal Continue Synthroid 75 mcg daily    Prediabetes Assessment & Plan: A1c 5.7 Encourage modification of diet and increase activity      PDMP reviewed  Return if symptoms worsen or fail to improve.  Dana Allan, MD

## 2023-04-01 NOTE — Patient Instructions (Addendum)
It was a pleasure meeting you today. Thank you for allowing me to take part in your health care.  Our goals for today as we discussed include:  Sorry to hear about your Daddy's passing. Please let me know if you need anything.  You have received the Pneumonia 20 vaccine today.  No further vaccines required.  Schedule Welcome to Medicare Visit   Continue Vitamin D 5000IU daily for 3 months then take 1000IU daily   Follow up as needed   If you have any questions or concerns, please do not hesitate to call the office at [phone].  I look forward to our next visit and until then take care and stay safe.  Regards,   Dana Allan, MD   [facility]

## 2023-04-07 ENCOUNTER — Encounter: Payer: Self-pay | Admitting: Family Medicine

## 2023-04-19 ENCOUNTER — Encounter: Payer: Self-pay | Admitting: Family Medicine

## 2023-04-19 DIAGNOSIS — Z23 Encounter for immunization: Secondary | ICD-10-CM | POA: Insufficient documentation

## 2023-04-19 MED ORDER — ROSUVASTATIN CALCIUM 10 MG PO TABS
10.0000 mg | ORAL_TABLET | Freq: Every day | ORAL | 3 refills | Status: DC
Start: 2023-04-19 — End: 2024-04-14

## 2023-04-19 NOTE — Assessment & Plan Note (Signed)
Mammogram up to date.  Due 03/2024 Recommend regular self breast exams No indication for PAP given TAH Colonoscopy up to date.  Due 2032 Tetanus up to date PNA 20 administered today Shingles x 2 completed Hep C/HIV screening completed PHQ9/GAD screening completed Low risk falls Dexa completed 03/2022.  T score -1.9.  Osteopenia  FRAX15.8% and 2.1% 10 yr probability.  Currently on Vitamin d supplements and has plenty calcium in diet. Flu vaccine when available. Welcome to Medicare visit to be scheduled in near future.

## 2023-04-19 NOTE — Assessment & Plan Note (Signed)
Chronic.  Stable. Recent TSH normal Continue Synthroid 75 mcg daily

## 2023-04-19 NOTE — Assessment & Plan Note (Signed)
Chronic.  Stable.  Well-controlled on current antihypertensives Continue Hyzaar 50-12.5 mg daily Cr wnl

## 2023-04-19 NOTE — Assessment & Plan Note (Signed)
Chronic. LDL at goal On statin therapy and tolerating well.  No myalgias Refill Crestor 10 mg daily

## 2023-04-19 NOTE — Assessment & Plan Note (Signed)
A1c 5.7 Encourage modification of diet and increase activity

## 2023-05-05 ENCOUNTER — Encounter: Payer: Self-pay | Admitting: Family Medicine

## 2023-05-05 ENCOUNTER — Ambulatory Visit: Payer: Medicare HMO | Admitting: Family Medicine

## 2023-05-05 VITALS — BP 126/74 | HR 72 | Temp 97.7°F | Resp 16 | Ht 62.0 in | Wt 187.2 lb

## 2023-05-05 DIAGNOSIS — Z0000 Encounter for general adult medical examination without abnormal findings: Secondary | ICD-10-CM | POA: Diagnosis not present

## 2023-05-05 DIAGNOSIS — E559 Vitamin D deficiency, unspecified: Secondary | ICD-10-CM

## 2023-05-05 DIAGNOSIS — E785 Hyperlipidemia, unspecified: Secondary | ICD-10-CM
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
## 2023-05-05 NOTE — Patient Instructions (Addendum)
Bone Density Test A bone density test uses a type of X-ray to measure the amount of calcium and other minerals in a person's bones. It can measure bone density in the hip and the spine. The test is similar to having a regular X-ray. This test may also be called: Bone densitometry. Bone mineral density test. Dual-energy X-ray absorptiometry (DEXA). You may have this test to: Diagnose a condition that causes weak or thin bones (osteoporosis). Screen you for osteoporosis. Predict your risk for a broken bone (fracture). Determine how well your osteoporosis treatment is working. Tell a health care provider about: Any allergies you have. All medicines you are taking, including vitamins, herbs, eye drops, creams, and over-the-counter medicines. Any problems you or family members have had with anesthetic medicines. Any blood disorders you have. Any surgeries you have had. Any medical conditions you have. Whether you are pregnant or may be pregnant. Any medical tests you have had within the past 14 days that used contrast material. What are the risks? Generally, this is a safe test. However, it does expose you to a small amount of radiation, which can slightly increase your cancer risk. What happens before the test? Do not take any calcium supplements within the 24 hours before your test. You will need to remove all metal jewelry, eyeglasses, removable dental appliances, and any other metal objects on your body. What happens during the test?  You will lie down on an exam table. There will be an X-ray generator below you and an imaging device above you. Other devices, such as boxes or braces, may be used to position your body properly for the scan. The machine will slowly scan your body. You will need to keep very still while the machine does the scan. The images will show up on a screen in the room. Images will be examined by a specialist after your test is finished. The procedure may vary  among health care providers and hospitals. What can I expect after the test? It is up to you to get the results of your test. Ask your health care provider, or the department that is doing the test, when your results will be ready. Summary A bone density test is an imaging test that uses a type of X-ray to measure the amount of calcium and other minerals in your bones. The test may be used to diagnose or screen you for a condition that causes weak or thin bones (osteoporosis), predict your risk for a broken bone (fracture), or determine how well your osteoporosis treatment is working. Do not take any calcium supplements within 24 hours before your test. Ask your health care provider, or the department that is doing the test, when your results will be ready. This information is not intended to replace advice given to you by your health care provider. Make sure you discuss any questions you have with your health care provider. Document Revised: 04/03/2021 Document Reviewed: 01/05/2020 Elsevier Patient Education  2024 [employer]  Fat and Cholesterol Restricted Eating Plan Eating a diet that limits fat and cholesterol may help lower your risk for heart disease and other conditions. Your body needs fat and cholesterol for basic functions, but eating too much of these things can be harmful to your health. Your health care provider may order lab tests to check your blood fat (lipid) and cholesterol levels. This helps your health care provider understand your risk for certain conditions and whether you need to make diet changes. Work with your health  care provider or dietitian to make an eating plan that is right for you. Your plan includes: Limit your fat intake to ______% or less of your total calories a day. This is ______g of fat per day. Limit your saturated fat intake to ______% or less of your total calories a day. This is ______g of saturated fat per day. Limit the amount of cholesterol in your  diet to less than _________mg a day. Eat ___________ g of fiber a day. What are tips for following this plan? General guidelines If you are overweight, work with your health care provider to lose weight safely. Losing just 5-10% of your body weight can improve your overall health and help prevent diseases such as diabetes and heart disease. Avoid: Foods with added sugar. Fried foods. Foods that contain partially hydrogenated oils, including stick margarine, some tub margarines, cookies, crackers, and other baked goods. If you drink alcohol: Limit how much you have to: 0-1 drink a day for women who are not pregnant. 0-2 drinks a day for men. Know how much alcohol is in a drink. In the [place] one drink equals one 12 oz bottle of beer (355 mL), one 5 oz glass of wine (148 mL), or one 1 oz glass of hard liquor (44 mL). Reading food labels Check food labels for: Trans fats or partially hydrogenated oils. Avoid foods that contain these. High amounts of saturated fat. Choose foods that are low in saturated fat (less than 2 g). The amount of cholesterol in each serving. The amount of fiber in each serving. Choose foods with healthy fats, such as: Monounsaturated and polyunsaturated fats. These include olive and canola oil, flaxseeds, walnuts, almonds, and seeds. Omega-3 fats. These are found in foods such as salmon, mackerel, sardines, tuna, flaxseed oil, and ground flaxseeds. Choose grain products that have whole grains. Look for the word "whole" as the first word in the ingredient list. Cooking Cook foods using methods other than frying. Baking, boiling, grilling, and broiling are some healthy options. Eat more home-cooked food and less restaurant, buffet, and fast food. Avoid cooking using saturated fats. Animal sources of saturated fats include meats, butter, and cream. Plant sources of saturated fats include palm oil, palm kernel oil, and coconut oil. Meal planning  At meals, imagine  dividing your plate into fourths: Fill one-half of your plate with vegetables, green salads, and fruit. Fill one-fourth of your plate with whole grains. Fill one-fourth of your plate with lean protein foods. Eat fish that is high in omega-3 fats at least two times a week. Eat more foods that contain fiber, such as whole grains, beans, apples, pears, berries, broccoli, carrots, peas, and barley. These foods help promote healthy cholesterol levels in the blood. What foods should I eat? Fruits All fresh, canned (in natural juice), or frozen fruits. Vegetables Fresh or frozen vegetables (raw, steamed, roasted, or grilled). Green salads. Grains Whole grains, such as whole wheat or whole grain breads, crackers, cereals, and pasta. Unsweetened oatmeal, bulgur, barley, quinoa, or brown rice. Corn or whole wheat flour tortillas. Meats and other proteins Ground beef (85% or leaner), grass-fed beef, or beef trimmed of fat. Skinless chicken or [place]. Ground chicken or [place]. Pork trimmed of fat. All fish and seafood. Egg whites. Dried beans, peas, or lentils. Unsalted nuts or seeds. Unsalted canned beans. Natural nut butters without added sugar and oil. Dairy Low-fat or nonfat dairy products, such as skim or 1% milk, 2% or reduced-fat cheeses, low-fat and fat-free ricotta or cottage  cheese, or plain low-fat and nonfat yogurt. Fats and oils Tub margarine without trans fats. Light or reduced-fat mayonnaise and salad dressings. Avocado. Olive, canola, sesame, or safflower oils. The items listed above may not be a complete list of foods and beverages you can eat. Contact a dietitian for more information. What foods should I avoid? Fruits Canned fruit in heavy syrup. Fruit in cream or butter sauce. Fried fruit. Vegetables Vegetables cooked in cheese, cream, or butter sauce. Fried vegetables. Grains White bread. White pasta. White rice. Cornbread. Bagels, pastries, and croissants. Crackers and snack foods  that contain trans fat and hydrogenated oils. Meats and other proteins Fatty cuts of meat. Ribs, chicken wings, bacon, sausage, bologna, salami, chitterlings, fatback, hot dogs, bratwurst, and packaged lunch meats. Liver and organ meats. Whole eggs and egg yolks. Chicken and [place] with skin. Fried meat. Dairy Whole or 2% milk, cream, half-and-half, and cream cheese. Whole milk cheeses. Whole-fat or sweetened yogurt. Full-fat cheeses. Nondairy creamers and whipped toppings. Processed cheese, cheese spreads, and cheese curds. Fats and oils Butter, stick margarine, lard, shortening, ghee, or bacon fat. Coconut, palm kernel, and palm oils. Beverages Alcohol. Sugar-sweetened drinks such as sodas, lemonade, and fruit drinks. Sweets and desserts Corn syrup, sugars, honey, and molasses. Candy. Jam and jelly. Syrup. Sweetened cereals. Cookies, pies, cakes, donuts, muffins, and ice cream. The items listed above may not be a complete list of foods and beverages you should avoid. Contact a dietitian for more information. Summary Your body needs fat and cholesterol for basic functions. However, eating too much of these things can be harmful to your health. Work with your health care provider and dietitian to follow a diet that limits fat and cholesterol. Doing this may help lower your risk for heart disease and other conditions. Choose healthy fats, such as monounsaturated and polyunsaturated fats, and foods high in omega-3 fatty acids. Eat fiber-rich foods, such as whole grains, beans, peas, fruits, and vegetables. Limit or avoid alcohol, fried foods, and foods high in saturated fats, partially hydrogenated oils, and sugar. This information is not intended to replace advice given to you by your health care provider. Make sure you discuss any questions you have with your health care provider. Document Revised: 11/30/2020 Document Reviewed: 11/30/2020 Elsevier Patient Education  2024 [employer]  Inc.  Diabetes Mellitus and Foot Care Diabetes, also called diabetes mellitus, may cause problems with your feet and legs because of poor blood flow (circulation). Poor circulation may make your skin: Become thinner and drier. Break more easily. Heal more slowly. Peel and crack. You may also have nerve damage (neuropathy). This can cause decreased feeling in your legs and feet. This means that you may not notice minor injuries to your feet that could lead to more serious problems. Finding and treating problems early is the best way to prevent future foot problems. How to care for your feet Foot hygiene  Wash your feet daily with warm water and mild soap. Do not use hot water. Then, pat your feet and the areas between your toes until they are fully dry. Do not soak your feet. This can dry your skin. Trim your toenails straight across. Do not dig under them or around the cuticle. File the edges of your nails with an emery board or nail file. Apply a moisturizing lotion or petroleum jelly to the skin on your feet and to dry, brittle toenails. Use lotion that does not contain alcohol and is unscented. Do not apply lotion between your toes. Shoes  and socks Wear clean socks or stockings every day. Make sure they are not too tight. Do not wear knee-high stockings. These may decrease blood flow to your legs. Wear shoes that fit well and have enough cushioning. Always look in your shoes before you put them on to be sure there are no objects inside. To break in new shoes, wear them for just a few hours a day. This prevents injuries on your feet. Wounds, scrapes, corns, and calluses  Check your feet daily for blisters, cuts, bruises, sores, and redness. If you cannot see the bottom of your feet, use a mirror or ask someone for help. Do not cut off corns or calluses or try to remove them with medicine. If you find a minor scrape, cut, or break in the skin on your feet, keep it and the skin around it clean  and dry. You may clean these areas with mild soap and water. Do not clean the area with peroxide, alcohol, or iodine. If you have a wound, scrape, corn, or callus on your foot, look at it several times a day to make sure it is healing and not infected. Check for: Redness, swelling, or pain. Fluid or blood. Warmth. Pus or a bad smell. General tips Do not cross your legs. This may decrease blood flow to your feet. Do not use heating pads or hot water bottles on your feet. They may burn your skin. If you have lost feeling in your feet or legs, you may not know this is happening until it is too late. Protect your feet from hot and cold by wearing shoes, such as at the beach or on hot pavement. Schedule a complete foot exam at least once a year or more often if you have foot problems. Report any cuts, sores, or bruises to your health care provider right away. Where to find more information American Diabetes Association: diabetes.org Association of Diabetes Care & Education Specialists: diabeteseducator.org Contact a health care provider if: You have a condition that increases your risk of infection, and you have any cuts, sores, or bruises on your feet. You have an injury that is not healing. You have redness on your legs or feet. You feel burning or tingling in your legs or feet. You have pain or cramps in your legs and feet. Your legs or feet are numb. Your feet always feel cold. You have pain around any toenails. Get help right away if: You have a wound, scrape, corn, or callus on your foot and: You have signs of infection. You have a fever. You have a red line going up your leg. This information is not intended to replace advice given to you by your health care provider. Make sure you discuss any questions you have with your health care provider. Document Revised: 01/22/2022 Document Reviewed: 01/22/2022 Elsevier Patient Education  2024 [employer] Inc.  Health Maintenance,  Female Adopting a healthy lifestyle and getting preventive care are important in promoting health and wellness. Ask your health care provider about: The right schedule for you to have regular tests and exams. Things you can do on your own to prevent diseases and keep yourself healthy. What should I know about diet, weight, and exercise? Eat a healthy diet  Eat a diet that includes plenty of vegetables, fruits, low-fat dairy products, and lean protein. Do not eat a lot of foods that are high in solid fats, added sugars, or sodium. Maintain a healthy weight Body mass index (BMI) is used to identify  weight problems. It estimates body fat based on height and weight. Your health care provider can help determine your BMI and help you achieve or maintain a healthy weight. Get regular exercise Get regular exercise. This is one of the most important things you can do for your health. Most adults should: Exercise for at least 150 minutes each week. The exercise should increase your heart rate and make you sweat (moderate-intensity exercise). Do strengthening exercises at least twice a week. This is in addition to the moderate-intensity exercise. Spend less time sitting. Even light physical activity can be beneficial. Watch cholesterol and blood lipids Have your blood tested for lipids and cholesterol at 65 years of age, then have this test every 5 years. Have your cholesterol levels checked more often if: Your lipid or cholesterol levels are high. You are older than 65 years of age. You are at high risk for heart disease. What should I know about cancer screening? Depending on your health history and family history, you may need to have cancer screening at various ages. This may include screening for: Breast cancer. Cervical cancer. Colorectal cancer. Skin cancer. Lung cancer. What should I know about heart disease, diabetes, and high blood pressure? Blood pressure and heart disease High blood  pressure causes heart disease and increases the risk of stroke. This is more likely to develop in people who have high blood pressure readings or are overweight. Have your blood pressure checked: Every 3-5 years if you are 6-56 years of age. Every year if you are 85 years old or older. Diabetes Have regular diabetes screenings. This checks your fasting blood sugar level. Have the screening done: Once every three years after age 58 if you are at a normal weight and have a low risk for diabetes. More often and at a younger age if you are overweight or have a high risk for diabetes. What should I know about preventing infection? Hepatitis B If you have a higher risk for hepatitis B, you should be screened for this virus. Talk with your health care provider to find out if you are at risk for hepatitis B infection. Hepatitis C Testing is recommended for: Everyone born from [birth year] through 1963. Anyone with known risk factors for hepatitis C. Sexually transmitted infections (STIs) Get screened for STIs, including gonorrhea and chlamydia, if: You are sexually active and are younger than 65 years of age. You are older than 65 years of age and your health care provider tells you that you are at risk for this type of infection. Your sexual activity has changed since you were last screened, and you are at increased risk for chlamydia or gonorrhea. Ask your health care provider if you are at risk. Ask your health care provider about whether you are at high risk for HIV. Your health care provider may recommend a prescription medicine to help prevent HIV infection. If you choose to take medicine to prevent HIV, you should first get tested for HIV. You should then be tested every 3 months for as long as you are taking the medicine. Pregnancy If you are about to stop having your period (premenopausal) and you may become pregnant, seek counseling before you get pregnant. Take 400 to 800 micrograms (mcg) of folic  acid every day if you become pregnant. Ask for birth control (contraception) if you want to prevent pregnancy. Osteoporosis and menopause Osteoporosis is a disease in which the bones lose minerals and strength with aging. This can result in bone fractures. If  you are 68 years old or older, or if you are at risk for osteoporosis and fractures, ask your health care provider if you should: Be screened for bone loss. Take a calcium or vitamin D supplement to lower your risk of fractures. Be given hormone replacement therapy (HRT) to treat symptoms of menopause. Follow these instructions at home: Alcohol use Do not drink alcohol if: Your health care provider tells you not to drink. You are pregnant, may be pregnant, or are planning to become pregnant. If you drink alcohol: Limit how much you have to: 0-1 drink a day. Know how much alcohol is in your drink. In the [place] one drink equals one 12 oz bottle of beer (355 mL), one 5 oz glass of wine (148 mL), or one 1 oz glass of hard liquor (44 mL). Lifestyle Do not use any products that contain nicotine or tobacco. These products include cigarettes, chewing tobacco, and vaping devices, such as e-cigarettes. If you need help quitting, ask your health care provider. Do not use street drugs. Do not share needles. Ask your health care provider for help if you need support or information about quitting drugs. General instructions Schedule regular health, dental, and eye exams. Stay current with your vaccines. Tell your health care provider if: You often feel depressed. You have ever been abused or do not feel safe at home. Summary Adopting a healthy lifestyle and getting preventive care are important in promoting health and wellness. Follow your health care provider's instructions about healthy diet, exercising, and getting tested or screened for diseases. Follow your health care provider's instructions on monitoring your cholesterol and blood  pressure. This information is not intended to replace advice given to you by your health care provider. Make sure you discuss any questions you have with your health care provider. Document Revised: 12/10/2020 Document Reviewed: 12/10/2020 Elsevier Patient Education  2024 [employer] Inc.  Mammogram A mammogram is an X-ray of the breasts. This procedure can screen for and detect any changes that may indicate breast cancer. Mammograms are regularly done beginning at age 16 for women with average risk. A man may have a mammogram if he has a lump or swelling in his breast tissue. A mammogram can also identify other changes and variations in the breast, such as: Inflammation of the breast tissue (mastitis). An infected area that contains a collection of pus (abscess). A fluid-filled sac (cyst). Tumors that are not cancerous (benign). Fibrocystic changes. This is when breast tissue becomes denser and can make the tissue feel rope-like or uneven under the skin. Women at higher risk for breast cancer need earlier and more comprehensive screening for abnormal changes. Breast tomosynthesis, or three-dimensional (3D) mammography, and digital breast tomosynthesis are advanced forms of imaging that create 3D pictures of the breasts. Tell a health care provider: About any allergies you have. If you have breast implants. If you have had previous breast disease, biopsy, or surgery. If you have a family history of breast cancer. If you are breastfeeding. Whether you are pregnant or may be pregnant. What are the risks? Generally, this is a safe procedure. However, problems may occur, including: Exposure to radiation. Radiation levels are very low with this test. The need for more tests. The mammogram fails to detect certain cancers or the results are misinterpreted. Difficulty with detecting breast cancer in women with dense breasts. What happens before the procedure? Schedule your test about 1-2 weeks  after your menstrual period if you are menstruating. This is usually when  your breasts are the least tender. If you have had a mammogram done at a different facility in the past, get the mammogram X-rays or have them sent to your current exam facility. The new and old images will be compared. Wash your breasts and underarms on the day of the test. Do not wear deodorants, perfumes, lotions, or powders anywhere on your body on the day of the test. Remove any jewelry from your neck. Wear clothes that you can change into and out of easily. What happens during the procedure?  You will undress from the waist up and put on a gown that opens in the front. You will stand in front of the X-ray machine. Each breast will be placed between two plastic or glass plates. The plates will compress your breast for a few seconds. Try to stay as relaxed as possible. This procedure does not cause any harm to your breasts. Any discomfort you feel will be very brief. X-rays will be taken from different angles of each breast. The procedure may vary among health care providers and hospitals. What can I expect after the procedure? The mammogram will be examined by a specialist (radiologist). You may need to repeat certain parts of the test, depending on the quality of the images. This is done if the radiologist needs a better view of the breast tissue. You may resume your normal activities. It is up to you to get the results of your procedure. Ask your health care provider, or the department that is doing the procedure, when your results will be ready. Summary A mammogram is an X-ray of the breasts. This procedure can screen for and detect any changes that may indicate breast cancer. A man may have a mammogram if he has a lump or swelling in his breast tissue. If you have had a mammogram done at a different facility in the past, get the mammogram X-rays or have them sent to your current exam facility in order to compare  them. Schedule your test about 1-2 weeks after your menstrual period if you are menstruating. Ask when your test results will be ready. Make sure you get your test results. This information is not intended to replace advice given to you by your health care provider. Make sure you discuss any questions you have with your health care provider. Document Revised: 04/03/2021 Document Reviewed: 05/21/2020 Elsevier Patient Education  2024 [employer].  Bone Density Test A bone density test uses a type of X-ray to measure the amount of calcium and other minerals in a person's bones. It can measure bone density in the hip and the spine. The test is similar to having a regular X-ray. This test may also be called: Bone densitometry. Bone mineral density test. Dual-energy X-ray absorptiometry (DEXA). You may have this test to: Diagnose a condition that causes weak or thin bones (osteoporosis). Screen you for osteoporosis. Predict your risk for a broken bone (fracture). Determine how well your osteoporosis treatment is working. Tell a health care provider about: Any allergies you have. All medicines you are taking, including vitamins, herbs, eye drops, creams, and over-the-counter medicines. Any problems you or family members have had with anesthetic medicines. Any blood disorders you have. Any surgeries you have had. Any medical conditions you have. Whether you are pregnant or may be pregnant. Any medical tests you have had within the past 14 days that used contrast material. What are the risks? Generally, this is a safe test. However, it does expose you to  a small amount of radiation, which can slightly increase your cancer risk. What happens before the test? Do not take any calcium supplements within the 24 hours before your test. You will need to remove all metal jewelry, eyeglasses, removable dental appliances, and any other metal objects on your body. What happens during the test?  You  will lie down on an exam table. There will be an X-ray generator below you and an imaging device above you. Other devices, such as boxes or braces, may be used to position your body properly for the scan. The machine will slowly scan your body. You will need to keep very still while the machine does the scan. The images will show up on a screen in the room. Images will be examined by a specialist after your test is finished. The procedure may vary among health care providers and hospitals. What can I expect after the test? It is up to you to get the results of your test. Ask your health care provider, or the department that is doing the test, when your results will be ready. Summary A bone density test is an imaging test that uses a type of X-ray to measure the amount of calcium and other minerals in your bones. The test may be used to diagnose or screen you for a condition that causes weak or thin bones (osteoporosis), predict your risk for a broken bone (fracture), or determine how well your osteoporosis treatment is working. Do not take any calcium supplements within 24 hours before your test. Ask your health care provider, or the department that is doing the test, when your results will be ready. This information is not intended to replace advice given to you by your health care provider. Make sure you discuss any questions you have with your health care provider. Document Revised: 04/03/2021 Document Reviewed: 01/05/2020 Elsevier Patient Education  2024 Else[employer] Inc.  Diabetes Mellitus and Foot Care Diabetes, also called diabetes mellitus, may cause problems with your feet and legs because of poor blood flow (circulation). Poor circulation may make your skin: Become thinner and drier. Break more easily. Heal more slowly. Peel and crack. You may also have nerve damage (neuropathy). This can cause decreased feeling in your legs and feet. This means that you may not notice minor injuries to  your feet that could lead to more serious problems. Finding and treating problems early is the best way to prevent future foot problems. How to care for your feet Foot hygiene  Wash your feet daily with warm water and mild soap. Do not use hot water. Then, pat your feet and the areas between your toes until they are fully dry. Do not soak your feet. This can dry your skin. Trim your toenails straight across. Do not dig under them or around the cuticle. File the edges of your nails with an emery board or nail file. Apply a moisturizing lotion or petroleum jelly to the skin on your feet and to dry, brittle toenails. Use lotion that does not contain alcohol and is unscented. Do not apply lotion between your toes. Shoes and socks Wear clean socks or stockings every day. Make sure they are not too tight. Do not wear knee-high stockings. These may decrease blood flow to your legs. Wear shoes that fit well and have enough cushioning. Always look in your shoes before you put them on to be sure there are no objects inside. To break in new shoes, wear them for just a few hours  a day. This prevents injuries on your feet. Wounds, scrapes, corns, and calluses  Check your feet daily for blisters, cuts, bruises, sores, and redness. If you cannot see the bottom of your feet, use a mirror or ask someone for help. Do not cut off corns or calluses or try to remove them with medicine. If you find a minor scrape, cut, or break in the skin on your feet, keep it and the skin around it clean and dry. You may clean these areas with mild soap and water. Do not clean the area with peroxide, alcohol, or iodine. If you have a wound, scrape, corn, or callus on your foot, look at it several times a day to make sure it is healing and not infected. Check for: Redness, swelling, or pain. Fluid or blood. Warmth. Pus or a bad smell. General tips Do not cross your legs. This may decrease blood flow to your feet. Do not use  heating pads or hot water bottles on your feet. They may burn your skin. If you have lost feeling in your feet or legs, you may not know this is happening until it is too late. Protect your feet from hot and cold by wearing shoes, such as at the beach or on hot pavement. Schedule a complete foot exam at least once a year or more often if you have foot problems. Report any cuts, sores, or bruises to your health care provider right away. Where to find more information American Diabetes Association: diabetes.org Association of Diabetes Care & Education Specialists: diabeteseducator.org Contact a health care provider if: You have a condition that increases your risk of infection, and you have any cuts, sores, or bruises on your feet. You have an injury that is not healing. You have redness on your legs or feet. You feel burning or tingling in your legs or feet. You have pain or cramps in your legs and feet. Your legs or feet are numb. Your feet always feel cold. You have pain around any toenails. Get help right away if: You have a wound, scrape, corn, or callus on your foot and: You have signs of infection. You have a fever. You have a red line going up your leg. This information is not intended to replace advice given to you by your health care provider. Make sure you discuss any questions you have with your health care provider. Document Revised: 01/22/2022 Document Reviewed: 01/22/2022 Elsevier Patient Education  2024 Elsevier Inc.  Screening for Type 2 Diabetes  A screening test for type 2 diabetes (type 2 diabetes mellitus) is a blood test to measure your blood sugar (glucose) level. This test is done to check for early signs of diabetes, before you develop symptoms.  Type 2 diabetes is a long-term (chronic) disease. In type 2 diabetes, one or both of these problems may be present: The pancreas does not make enough of a hormone called insulin. Cells in the body do not respond properly  to insulin that the body makes (insulin resistance). Normally, insulin allows blood sugar (glucose) to enter cells in the body. The cells use glucose for energy. Insulin resistance or lack of insulin causes excess glucose to build up in the blood instead of going into cells. This results in high blood glucose levels (hyperglycemia), which can cause many complications. You may be screened for type 2 diabetes as part of your regular health care, especially if you have a high risk for diabetes. Screening can help to identify type 2 diabetes  at its early stage (prediabetes). Identifying and treating prediabetes may delay or prevent the development of type 2 diabetes. Tell a health care provider about: All medicines you are taking, including vitamins, herbs, eye drops, creams, and over-the-counter medicines. Any bleeding problems you have. Any medical conditions you have. Whether you are pregnant or may be pregnant. Who should be screened for type 2 diabetes? Adults Adults age 60 and older. These adults should be screened once every three years. Adults who are any age, are overweight, and have one other risk factor. These adults should be screened once every three years. Adults who have normal blood glucose levels and two or more risk factors. These adults may be screened once every year (annually). Women who have had gestational diabetes in the past. These women should be screened once every three years. Pregnant women who have risk factors. These women should be screened at their first prenatal visit and again between weeks 24 and 28 of pregnancy. Children and adolescents Children and adolescents should be screened for type 2 diabetes if they are overweight and have any of the following risk factors: A family history of type 2 diabetes. Being a member of a high-risk ethnic group. Signs of insulin resistance or conditions that are associated with insulin resistance. A mother who had gestational  diabetes while pregnant. Screening should be done at least once every three years, starting at age 71 or at the onset of puberty, whichever comes first. Your health care provider or your child's health care provider may recommend having a screening more or less often. What are the risk factors for type 2 diabetes? The following are factors that may make you more likely to develop type 2 diabetes and can be modified: Not getting enough exercise. Having high blood pressure. Having low levels of good cholesterol (HDL-C) or high levels of blood fats (triglycerides). Having high blood glucose in a previous blood test. Being overweight or obese. The following are factors that may make you more likely to develop type 2 diabetes and can not be modified: Having a parent or sibling (first-degree relative) who has diabetes. Being of American-Indian, African-American, Hispanic/Latino, Asian, or Pacific Islander descent. Being older than age 11. Having a history of diabetes during pregnancy (gestational diabetes). Having certain diseases or conditions that may be caused by insulin resistance, including: Acanthosis nigricans. This is a condition that causes dark skin on the neck, armpits, and groin. Polycystic ovary syndrome (PCOS). Cardiovascular heart disease. What happens during screening? During screening, your health care provider may ask questions about: Your health and your risk factors, including your activity level and any medical conditions that you have. The health of your first-degree relatives. Past pregnancies, if this applies. Your health care provider will also do a physical exam, including a blood pressure measurement and blood tests. There are four blood tests that can be used to screen for type 2 diabetes. You may have one or more of the following: A fasting blood glucose (FBG) test. You will not be allowed to eat (you will fast) for 8 hours or more before a blood sample is taken. A  random blood glucose test. This test checks your blood glucose at any time of the day regardless of when you ate. An oral glucose tolerance test (OGTT). This test measures your blood glucose at two times: After you have not eaten (have fasted) overnight. This is your baseline glucose level. Two hours after you drink a glucose-containing beverage. An A1C (hemoglobin A1C) blood test.  This test provides information about blood glucose control over the previous 2-3 months. What do the results mean? Your test results are a measurement of how much glucose is in your blood. Normal blood glucose levels mean that you do not have diabetes or prediabetes. High blood glucose levels may mean that you have prediabetes or diabetes. Depending on the results, other tests may be needed to confirm the diagnosis. You may be diagnosed with type 2 diabetes if: Your FBG level is 126 mg/dL (7.0 mmol/L) or higher. Your random blood glucose level is 200 mg/dL (78.2 mmol/L) or higher. Your A1C level is 6.5% or higher. Your OGTT result is higher than 200 mg/dL (95.6 mmol/L). These blood tests may be repeated to confirm your diagnosis. Talk with your health care provider about what your results mean. Summary A screening test for type 2 diabetes (type 2 diabetes mellitus) is a blood test to measure your blood sugar (glucose) level. Know what your risk factors are for developing type 2 diabetes. If you are at risk, get screening tests as often as told by your health care provider. Screening may help you identify type 2 diabetes at its early stage (prediabetes). Identifying and treating prediabetes may delay or prevent the development of type 2 diabetes. This information is not intended to replace advice given to you by your health care provider. Make sure you discuss any questions you have with your health care provider. Document Revised: 10/15/2020 Document Reviewed: 10/15/2020 Elsevier Patient Education  2024 Elsevier  Inc.  Health Maintenance, Female Adopting a healthy lifestyle and getting preventive care are important in promoting health and wellness. Ask your health care provider about: The right schedule for you to have regular tests and exams. Things you can do on your own to prevent diseases and keep yourself healthy. What should I know about diet, weight, and exercise? Eat a healthy diet  Eat a diet that includes plenty of vegetables, fruits, low-fat dairy products, and lean protein. Do not eat a lot of foods that are high in solid fats, added sugars, or sodium. Maintain a healthy weight Body mass index (BMI) is used to identify weight problems. It estimates body fat based on height and weight. Your health care provider can help determine your BMI and help you achieve or maintain a healthy weight. Get regular exercise Get regular exercise. This is one of the most important things you can do for your health. Most adults should: Exercise for at least 150 minutes each week. The exercise should increase your heart rate and make you sweat (moderate-intensity exercise). Do strengthening exercises at least twice a week. This is in addition to the moderate-intensity exercise. Spend less time sitting. Even light physical activity can be beneficial. Watch cholesterol and blood lipids Have your blood tested for lipids and cholesterol at 65 years of age, then have this test every 5 years. Have your cholesterol levels checked more often if: Your lipid or cholesterol levels are high. You are older than 65 years of age. You are at high risk for heart disease. What should I know about cancer screening? Depending on your health history and family history, you may need to have cancer screening at various ages. This may include screening for: Breast cancer. Cervical cancer. Colorectal cancer. Skin cancer. Lung cancer. What should I know about heart disease, diabetes, and high blood pressure? Blood pressure and  heart disease High blood pressure causes heart disease and increases the risk of stroke. This is more likely to develop  in people who have high blood pressure readings or are overweight. Have your blood pressure checked: Every 3-5 years if you are 89-78 years of age. Every year if you are 81 years old or older. Diabetes Have regular diabetes screenings. This checks your fasting blood sugar level. Have the screening done: Once every three years after age 30 if you are at a normal weight and have a low risk for diabetes. More often and at a younger age if you are overweight or have a high risk for diabetes. What should I know about preventing infection? Hepatitis B If you have a higher risk for hepatitis B, you should be screened for this virus. Talk with your health care provider to find out if you are at risk for hepatitis B infection. Hepatitis C Testing is recommended for: Everyone born from [birth year] through 1963. Anyone with known risk factors for hepatitis C. Sexually transmitted infections (STIs) Get screened for STIs, including gonorrhea and chlamydia, if: You are sexually active and are younger than 65 years of age. You are older than 65 years of age and your health care provider tells you that you are at risk for this type of infection. Your sexual activity has changed since you were last screened, and you are at increased risk for chlamydia or gonorrhea. Ask your health care provider if you are at risk. Ask your health care provider about whether you are at high risk for HIV. Your health care provider may recommend a prescription medicine to help prevent HIV infection. If you choose to take medicine to prevent HIV, you should first get tested for HIV. You should then be tested every 3 months for as long as you are taking the medicine. Pregnancy If you are about to stop having your period (premenopausal) and you may become pregnant, seek counseling before you get pregnant. Take 400 to 800  micrograms (mcg) of folic acid every day if you become pregnant. Ask for birth control (contraception) if you want to prevent pregnancy. Osteoporosis and menopause Osteoporosis is a disease in which the bones lose minerals and strength with aging. This can result in bone fractures. If you are 36 years old or older, or if you are at risk for osteoporosis and fractures, ask your health care provider if you should: Be screened for bone loss. Take a calcium or vitamin D supplement to lower your risk of fractures. Be given hormone replacement therapy (HRT) to treat symptoms of menopause. Follow these instructions at home: Alcohol use Do not drink alcohol if: Your health care provider tells you not to drink. You are pregnant, may be pregnant, or are planning to become pregnant. If you drink alcohol: Limit how much you have to: 0-1 drink a day. Know how much alcohol is in your drink. In the [place] one drink equals one 12 oz bottle of beer (355 mL), one 5 oz glass of wine (148 mL), or one 1 oz glass of hard liquor (44 mL). Lifestyle Do not use any products that contain nicotine or tobacco. These products include cigarettes, chewing tobacco, and vaping devices, such as e-cigarettes. If you need help quitting, ask your health care provider. Do not use street drugs. Do not share needles. Ask your health care provider for help if you need support or information about quitting drugs. General instructions Schedule regular health, dental, and eye exams. Stay current with your vaccines. Tell your health care provider if: You often feel depressed. You have ever been abused or do not feel  safe at home. Summary Adopting a healthy lifestyle and getting preventive care are important in promoting health and wellness. Follow your health care provider's instructions about healthy diet, exercising, and getting tested or screened for diseases. Follow your health care provider's instructions on monitoring your  cholesterol and blood pressure. This information is not intended to replace advice given to you by your health care provider. Make sure you discuss any questions you have with your health care provider. Document Revised: 12/10/2020 Document Reviewed: 12/10/2020 Elsevier Patient Education  2024 [employer].

## 2023-05-13 ENCOUNTER — Encounter: Payer: Self-pay | Admitting: Family Medicine

## 2023-05-13 DIAGNOSIS — Z0000 Encounter for general adult medical examination without abnormal findings: Secondary | ICD-10-CM | POA: Insufficient documentation

## 2023-05-13 DIAGNOSIS — E559 Vitamin D deficiency, unspecified: Secondary | ICD-10-CM | POA: Insufficient documentation

## 2023-07-15 ENCOUNTER — Telehealth: Payer: Self-pay | Admitting: Family Medicine

## 2023-07-15 NOTE — Telephone Encounter (Signed)
Patient need lab orders.

## 2023-07-22 ENCOUNTER — Other Ambulatory Visit (INDEPENDENT_AMBULATORY_CARE_PROVIDER_SITE_OTHER): Payer: Medicare HMO

## 2023-07-22 ENCOUNTER — Other Ambulatory Visit: Payer: Self-pay | Admitting: Family Medicine

## 2023-07-22 DIAGNOSIS — E559 Vitamin D deficiency, unspecified: Secondary | ICD-10-CM

## 2023-07-22 DIAGNOSIS — E785 Hyperlipidemia, unspecified: Secondary | ICD-10-CM

## 2023-07-22 LAB — VITAMIN D 25 HYDROXY (VIT D DEFICIENCY, FRACTURES): VITD: 38.06 ng/mL (ref 30.00–100.00)

## 2023-07-22 NOTE — Addendum Note (Signed)
Addended by: Warden Fillers on: 07/22/2023 10:41 AM   Modules accepted: Orders

## 2023-07-24 ENCOUNTER — Other Ambulatory Visit: Payer: Self-pay | Admitting: Family Medicine

## 2023-07-24 ENCOUNTER — Telehealth: Payer: Self-pay

## 2023-07-24 DIAGNOSIS — E559 Vitamin D deficiency, unspecified: Secondary | ICD-10-CM

## 2023-07-24 MED ORDER — VITAMIN D (ERGOCALCIFEROL) 1.25 MG (50000 UNIT) PO CAPS: 50000.0000 [IU] | ORAL_CAPSULE | ORAL | 0 refills | Status: DC

## 2023-07-24 NOTE — Telephone Encounter (Signed)
Pt is willing to start on prescription Vitamin D weekly. Please send to  [facility] [address].

## 2023-07-24 NOTE — Telephone Encounter (Signed)
Copied from CRM [id number]. Topic: Clinical - Medication Question >> Jul 24, 2023 12:54 PM Elizebeth Brooking wrote: Reason for CRM: Patient wants a callback regarding her medication and dosages of medication

## 2023-07-31 ENCOUNTER — Encounter: Payer: Self-pay | Admitting: Family Medicine

## 2023-08-11 NOTE — Telephone Encounter (Signed)
 Patient need lab orders.

## 2023-08-17 ENCOUNTER — Other Ambulatory Visit: Payer: Self-pay | Admitting: Family Medicine

## 2023-08-17 DIAGNOSIS — I10 Essential (primary) hypertension: Secondary | ICD-10-CM

## 2023-08-17 DIAGNOSIS — E038 Other specified hypothyroidism: Secondary | ICD-10-CM

## 2023-08-17 DIAGNOSIS — E785 Hyperlipidemia, unspecified: Secondary | ICD-10-CM

## 2023-08-17 DIAGNOSIS — E538 Deficiency of other specified B group vitamins: Secondary | ICD-10-CM

## 2023-08-17 DIAGNOSIS — R7309 Other abnormal glucose: Secondary | ICD-10-CM

## 2023-08-17 DIAGNOSIS — E559 Vitamin D deficiency, unspecified: Secondary | ICD-10-CM

## 2023-08-31 DIAGNOSIS — H524 Presbyopia: Secondary | ICD-10-CM | POA: Diagnosis not present

## 2023-08-31 DIAGNOSIS — H35033 Hypertensive retinopathy, bilateral: Secondary | ICD-10-CM | POA: Diagnosis not present

## 2023-08-31 DIAGNOSIS — Z0100 Encounter for examination of eyes and vision without abnormal findings: Secondary | ICD-10-CM | POA: Diagnosis not present

## 2023-08-31 DIAGNOSIS — H2513 Age-related nuclear cataract, bilateral: Secondary | ICD-10-CM | POA: Diagnosis not present

## 2023-08-31 DIAGNOSIS — I10 Essential (primary) hypertension: Secondary | ICD-10-CM | POA: Diagnosis not present

## 2023-08-31 DIAGNOSIS — D3132 Benign neoplasm of left choroid: Secondary | ICD-10-CM | POA: Diagnosis not present

## 2023-08-31 DIAGNOSIS — H43811 Vitreous degeneration, right eye: Secondary | ICD-10-CM | POA: Diagnosis not present

## 2023-09-01 ENCOUNTER — Other Ambulatory Visit: Payer: Self-pay | Admitting: Family Medicine

## 2023-09-01 DIAGNOSIS — E039 Hypothyroidism, unspecified: Secondary | ICD-10-CM

## 2023-09-01 MED ORDER — LEVOTHYROXINE SODIUM 75 MCG PO TABS: ORAL_TABLET | ORAL | 3 refills | Status: DC

## 2023-09-01 NOTE — Telephone Encounter (Signed)
Copied from CRM [id number]. Topic: Clinical - Medication Refill >> Sep 01, 2023  9:29 AM Marica Otter wrote: Most Recent Primary Care Visit:  Provider: [facility]  Department: [facility]  Visit Type: LAB  Date: 07/22/2023  Medication: levothyroxine (SYNTHROID) 75 MCG tablet  Has the patient contacted their pharmacy? Yes,  (Agent: If no, request that the patient contact the pharmacy for the refill. If patient does not wish to contact the pharmacy document the reason why and proceed with request.) (Agent: If yes, when and what did the pharmacy advise?)  Is this the correct pharmacy for this prescription? Yes If no, delete pharmacy and type the correct one.  This is the patient's preferred pharmacy:  [id number] [place] [place] - [address] [place] [address] Phone: [phone] Fax: [phone]   Has the prescription been filled recently? No  Is the patient out of the medication? Yes  Has the patient been seen for an appointment in the last year OR does the patient have an upcoming appointment? Yes  Can we respond through MyChart? Yes  Agent: Please be advised that Rx refills may take up to 3 business days. We ask that you follow-up with your pharmacy.

## 2023-09-30 ENCOUNTER — Other Ambulatory Visit: Payer: Self-pay | Admitting: Family Medicine

## 2023-09-30 DIAGNOSIS — E559 Vitamin D deficiency, unspecified: Secondary | ICD-10-CM

## 2023-10-02 ENCOUNTER — Other Ambulatory Visit: Payer: Self-pay | Admitting: Family Medicine

## 2023-10-02 ENCOUNTER — Telehealth: Payer: Self-pay

## 2023-10-02 DIAGNOSIS — E559 Vitamin D deficiency, unspecified: Secondary | ICD-10-CM

## 2023-10-02 NOTE — Telephone Encounter (Signed)
 Copied from CRM [id number]. Topic: Clinical - Medication Refill >> Oct 02, 2023  3:24 PM Almira Coaster wrote: Most Recent Primary Care Visit:  Provider: [facility]  Department: [facility]  Visit Type: LAB  Date: 07/22/2023  Medication: Vitamin D, Ergocalciferol, (DRISDOL) 1.25 MG (50000 UNIT) CAPS capsule  Has the patient contacted their pharmacy? Yes, pharmacy faxed request to the office (Agent: If no, request that the patient contact the pharmacy for the refill. If patient does not wish to contact the pharmacy document the reason why and proceed with request.) (Agent: If yes, when and what did the pharmacy advise?)  Is this the correct pharmacy for this prescription? Yes If no, delete pharmacy and type the correct one.  This is the patient's preferred pharmacy:  [id number] [place],  - [address] [place] [address] Phone: [phone] Fax: [phone]   Has the prescription been filled recently? No  Is the patient out of the medication? No, she has enough for two weeks.   Has the patient been seen for an appointment in the last year OR does the patient have an upcoming appointment? Yes  Can we respond through MyChart? Yes  Agent: Please be advised that Rx refills may take up to 3 business days. We ask that you follow-up with your pharmacy.

## 2023-10-02 NOTE — Telephone Encounter (Signed)
 Called patient reviewed all information and repeated back to me. Will call if any questions.  ? ?

## 2023-10-02 NOTE — Telephone Encounter (Signed)
 Copied from CRM [id number]. Topic: Clinical - Medication Question >> Oct 02, 2023  3:27 PM Almira Coaster wrote: Reason for CRM: Patient is calling because she states he insurance does not cover her Vitamin D, Ergocalciferol, (DRISDOL) 1.25 MG (50000 UNIT) CAPS capsule prescription. She would like to know if there is an alternative brand that may be covered.

## 2023-10-02 NOTE — Telephone Encounter (Signed)
 Last Fill: 07/24/23  Last OV: 05/05/23 Next OV: 04/07/24  Routing to provider for review/authorization.

## 2023-10-12 DIAGNOSIS — D3132 Benign neoplasm of left choroid: Secondary | ICD-10-CM | POA: Diagnosis not present

## 2023-10-12 DIAGNOSIS — H43811 Vitreous degeneration, right eye: Secondary | ICD-10-CM | POA: Diagnosis not present

## 2023-11-03 ENCOUNTER — Other Ambulatory Visit: Payer: Self-pay

## 2023-11-03 ENCOUNTER — Telehealth: Payer: Self-pay | Admitting: Family Medicine

## 2023-11-03 DIAGNOSIS — M810 Age-related osteoporosis without current pathological fracture: Secondary | ICD-10-CM

## 2023-11-03 DIAGNOSIS — Z1231 Encounter for screening mammogram for malignant neoplasm of breast: Secondary | ICD-10-CM

## 2023-11-03 NOTE — Telephone Encounter (Signed)
 Patient will be having a physical with Lindsay Carney in September she will do a TOC to [facility] in later September. Patient would like to have her bone density and mammogram done in August. Can Dr Clent Ridges put the orders in or does patient have to wait? Please advise patient.

## 2023-11-03 NOTE — Telephone Encounter (Signed)
 Bone density and mammo order. Called pt and let her know that she can call to get it scheduled.

## 2024-03-21 ENCOUNTER — Ambulatory Visit
Admission: RE | Admit: 2024-03-21 | Discharge: 2024-03-21 | Disposition: A | Discharge status: Home / Self Care | Source: Ambulatory Visit | Attending: Family Medicine | Admitting: Family Medicine

## 2024-03-21 DIAGNOSIS — Z1231 Encounter for screening mammogram for malignant neoplasm of breast: Secondary | ICD-10-CM | POA: Diagnosis not present

## 2024-03-21 DIAGNOSIS — M810 Age-related osteoporosis without current pathological fracture: Secondary | ICD-10-CM | POA: Diagnosis not present

## 2024-03-21 DIAGNOSIS — Z780 Asymptomatic menopausal state: Secondary | ICD-10-CM | POA: Diagnosis not present

## 2024-03-21 DIAGNOSIS — M8589 Other specified disorders of bone density and structure, multiple sites: Secondary | ICD-10-CM | POA: Diagnosis not present

## 2024-03-23 ENCOUNTER — Ambulatory Visit: Payer: Self-pay | Admitting: Nurse Practitioner

## 2024-03-24 ENCOUNTER — Telehealth: Payer: Self-pay

## 2024-03-24 DIAGNOSIS — I10 Essential (primary) hypertension: Secondary | ICD-10-CM

## 2024-03-24 MED ORDER — POTASSIUM CHLORIDE CRYS ER 20 MEQ PO TBCR
20.0000 meq | EXTENDED_RELEASE_TABLET | Freq: Every day | ORAL | 0 refills | Status: DC
Start: 2024-03-24 — End: 2024-04-28

## 2024-03-24 NOTE — Addendum Note (Signed)
 Addended by: Eboni Coval on: 03/24/2024 12:04 PM   Modules accepted: Orders

## 2024-03-24 NOTE — Telephone Encounter (Signed)
 Prescription Request  03/24/2024  LOV: Visit date not found  What is the name of the medication or equipment?  potassium chloride  SA (KLOR-CON  M) 20 MEQ tablet   Have you contacted your pharmacy to request a refill? No   Which pharmacy would you like this sent to?  [id number] - [place], [place] - [address] [place] [address] Phone: [phone] Fax: [phone]    Patient notified that their request is being sent to the clinical staff for review and that they should receive a response within 2 business days.   Please advise at [facility] [phone]    Pt states she has 9 days left and says she needs a new prescription. **Being sent to Doc of the Day since needed before TOC

## 2024-04-05 ENCOUNTER — Other Ambulatory Visit: Payer: Medicare HMO

## 2024-04-07 ENCOUNTER — Encounter: Payer: Medicare HMO | Admitting: Family Medicine

## 2024-04-07 ENCOUNTER — Encounter: Admitting: Nurse Practitioner

## 2024-04-14 ENCOUNTER — Ambulatory Visit: Payer: Self-pay

## 2024-04-14 ENCOUNTER — Other Ambulatory Visit: Payer: Self-pay

## 2024-04-14 DIAGNOSIS — E785 Hyperlipidemia, unspecified: Secondary | ICD-10-CM

## 2024-04-14 MED ORDER — ROSUVASTATIN CALCIUM 10 MG PO TABS: 10.0000 mg | ORAL_TABLET | Freq: Every day | ORAL | 0 refills | Status: DC

## 2024-04-14 NOTE — Telephone Encounter (Signed)
 FYI Only or Action Required?: Action required by provider: clinical question for provider and request COVID medication see note.  Patient was last seen in primary care on 05/05/2023 by Hope Merle, MD.  Called Nurse Triage reporting Covid Positive.  Symptoms began Tuesday.  Interventions attempted: Nothing.  Symptoms are: gradually worsening.  Triage Disposition: Call PCP Within 24 Hours  Patient/caregiver understands and will follow disposition?: Yes   Copied from CRM [id number]. Topic: Clinical - Red Word Triage >> Apr 14, 2024  9:43 AM Rosina BIRCH wrote: Reason for CRM: patient called stating she tested positive for covid with a home test and she would like to know if she can get some medicine that she had from feb. 2023 when she had covid before. Patient has a cough, sinuses and her blood pressure  is high due to the medication she is taking to help with issues 147/97 and has came down to the 140s  and it is bouncing around CB [id number] Reason for Disposition  [1] HIGH RISK patient (e.g., weak immune system, age > 64 years, obesity with BMI 30 or higher, pregnant, chronic lung disease or other chronic medical condition) AND [2] COVID symptoms (e.g., cough, fever)  (Exceptions: Already seen by PCP and no new or worsening symptoms.)  Answer Assessment - Initial Assessment Questions 1. COVID-19 DIAGNOSIS: How do you know that you have COVID? (e.g., positive lab test or self-test, diagnosed by doctor or NP/PA, symptoms after exposure).     Home test on Tuesday - results positive 2. COVID-19 EXPOSURE: Was there any known exposure to COVID before the symptoms began? CDC Definition of close contact: within 6 feet (2 meters) for a total of 15 minutes or more over a 24-hour period.      no 3. ONSET: When did the COVID-19 symptoms start?      Tuesday 4. WORST SYMPTOM: What is your worst symptom? (e.g., cough, fever, shortness of breath, muscle aches)     SOB  5. COUGH: Do you have  a cough? If Yes, ask: How bad is the cough?       Yes some mucous 6. FEVER: Do you have a fever? If Yes, ask: What is your temperature, how was it measured, and when did it start?     no 7. RESPIRATORY STATUS: Describe your breathing? (e.g., normal; shortness of breath, wheezing, unable to speak)      SOB at times 8. BETTER-SAME-WORSE: Are you getting better, staying the same or getting worse compared to yesterday?  If getting worse, ask, In what way?     worse 9. OTHER SYMPTOMS: Do you have any other symptoms?  (e.g., chills, fatigue, headache, loss of smell or taste, muscle pain, sore throat)     Sinus drainage, sore throat, sweats, chills,sore throat, body aches 10.  chronic medical problems? (e.g., asthma, heart or lung disease, weak immune system, obesity, etc.)       no 11. VACCINE: Have you had the COVID-19 vaccine? If Yes, ask: Which one, how many shots, when did you get it?       yes 12. PREGNANCY: Is there any chance you are pregnant? When was your last menstrual period?       no 13. O2 SATURATION MONITOR:  Do you use an oxygen saturation monitor (pulse oximeter) at home? If Yes, ask What is your reading (oxygen level) today? What is your usual oxygen saturation reading? (e.g., 95%)       na   BP 147/97  but has come down.  Pt is very anxious b/c family died from COVID in the past. Pt is requesting COVID medication.  [facility] [place] [place]  pt took a COVID medication in 2023: can't take regular COVID medication due to thyroid  and / or BP  Protocols used: Coronavirus (COVID-19) Diagnosed or Suspected-A-AH

## 2024-04-14 NOTE — Telephone Encounter (Signed)
 Noted

## 2024-04-25 ENCOUNTER — Other Ambulatory Visit: Payer: Self-pay

## 2024-04-25 DIAGNOSIS — I10 Essential (primary) hypertension: Secondary | ICD-10-CM

## 2024-04-25 MED ORDER — LOSARTAN POTASSIUM-HCTZ 50-12.5 MG PO TABS: 1.0000 | ORAL_TABLET | Freq: Every day | ORAL | 0 refills | Status: DC

## 2024-04-28 ENCOUNTER — Ambulatory Visit

## 2024-04-28 VITALS — BP 118/60 | HR 88 | Temp 98.4°F | Ht 62.0 in | Wt 192.2 lb

## 2024-04-28 DIAGNOSIS — I10 Essential (primary) hypertension: Secondary | ICD-10-CM

## 2024-04-28 DIAGNOSIS — E039 Hypothyroidism, unspecified: Secondary | ICD-10-CM

## 2024-04-28 DIAGNOSIS — M8589 Other specified disorders of bone density and structure, multiple sites: Secondary | ICD-10-CM

## 2024-04-28 DIAGNOSIS — E66811 Obesity, class 1: Secondary | ICD-10-CM

## 2024-04-28 DIAGNOSIS — E782 Mixed hyperlipidemia: Secondary | ICD-10-CM

## 2024-04-28 DIAGNOSIS — E559 Vitamin D deficiency, unspecified: Secondary | ICD-10-CM | POA: Diagnosis not present

## 2024-04-28 DIAGNOSIS — R7303 Prediabetes: Secondary | ICD-10-CM

## 2024-04-28 DIAGNOSIS — E038 Other specified hypothyroidism: Secondary | ICD-10-CM

## 2024-04-28 DIAGNOSIS — E785 Hyperlipidemia, unspecified: Secondary | ICD-10-CM

## 2024-04-28 DIAGNOSIS — R7309 Other abnormal glucose: Secondary | ICD-10-CM

## 2024-04-28 DIAGNOSIS — E882 Lipomatosis, not elsewhere classified: Secondary | ICD-10-CM

## 2024-04-28 MED ORDER — POTASSIUM CHLORIDE CRYS ER 20 MEQ PO TBCR: 20.0000 meq | EXTENDED_RELEASE_TABLET | Freq: Every day | ORAL | 3 refills | Status: AC

## 2024-04-28 MED ORDER — ROSUVASTATIN CALCIUM 10 MG PO TABS: 10.0000 mg | ORAL_TABLET | Freq: Every day | ORAL | 3 refills | Status: AC

## 2024-04-28 MED ORDER — LOSARTAN POTASSIUM-HCTZ 50-12.5 MG PO TABS: 1.0000 | ORAL_TABLET | Freq: Every day | ORAL | 3 refills | Status: AC

## 2024-04-28 MED ORDER — LEVOTHYROXINE SODIUM 75 MCG PO TABS: ORAL_TABLET | ORAL | 3 refills | Status: DC

## 2024-04-28 NOTE — Assessment & Plan Note (Signed)
 Goal weight 140. She stays active and participates in various strength and cardiovascular exercise. Does have prediabetes, hypertension, hyperlipidemia.  Continue regular exercise, diet changes with goal of calorie deficit recommended.

## 2024-04-28 NOTE — Assessment & Plan Note (Signed)
 Painless, multiple, diffuse benign subcutaneous disposition on abdomen, b/l upper arms, continue to monitor.

## 2024-04-28 NOTE — Assessment & Plan Note (Signed)
 BP within goal today. No chest pain, palpitations, lower leg edema.  Continue Losartan -Hydrochlorothiazide 50-12.5 mg daily, continue. Continue Potassium 20 meq daily. Repeat CMP

## 2024-04-28 NOTE — Assessment & Plan Note (Signed)
 Check A1c.  Patient exercises regularly. Will continue to work on reduced caloric intake daily.

## 2024-04-28 NOTE — Assessment & Plan Note (Signed)
 Long-standing hypothyroidism well-managed with levothyroxine  75 mcg daily, continue. Check TSH

## 2024-04-28 NOTE — Assessment & Plan Note (Signed)
 On vitamin D  5,000 units daily, consumes calcium  rich meal.  Check vitamin D , CMP.  Will continue to monitor , repeat DEXA in 3 years

## 2024-04-28 NOTE — Assessment & Plan Note (Signed)
 Previously diagnosed with vitamin D  deficiency improved with daily OTC vitamin D  5,000 units daily.  Has osteopenia of b/l femur neck.  Repeat vitamin D ,, continue current vitamin D  supplement.

## 2024-04-28 NOTE — Assessment & Plan Note (Signed)
 Check fasting lipid panel, lab ordered. Continue Rosuvastatin  10 mg daily. Check CMP.

## 2024-04-28 NOTE — Assessment & Plan Note (Signed)
 Normal mammogram on 03/22/24, does have dense breast tissue. Recommend annual breast cancer evaluation with mammogram. If abnormal breast lump, bump, discharge recommend office evaluation.

## 2024-04-28 NOTE — Progress Notes (Signed)
 Established Patient Office Visit TOC from Dr. Hope    Subjective  Patient ID: Lindsay Carney, female    DOB: 20-Sep-1955  Age: 68 y.o. MRN: [id number]  Chief Complaint  Patient presents with   Establish Care    She  has a past medical history of Anemia, Annual physical exam (03/01/2018), Burn, Complication of anesthesia, Constipation (07/24/2014), COVID-19, Diffuse cystic mastopathy, Elevated liver enzymes (05/21/2018), Hiatal hernia, History of fibrocystic disease of breast (03/01/2013), History of pituitary disease (07/24/2014), History of surgery (04/04/2019), Hot flashes (07/24/2014), Hyperlipidemia, Hyperpigmented toenail lesion (03/13/2018), Hypertension, Hypothyroidism, Lipoma (05/21/2018), Low back pain (03/13/2018), Obesity (BMI 30-39.9) (07/24/2014), PONV (postoperative nausea and vomiting), Right tibial fracture, Right tibial fracture (03/07/2019), and Thyroid  disease (2011).  HPI Discussed the use of AI scribe software for clinical note transcription with the patient, who gave verbal consent to proceed.  History of Present Illness Lindsay Carney is a 68 year old female who presents for a transfer of care from previous PCP and chronic medication management.   She recently had a mild case of COVID-19, testing positive on April 12, 2024, and has had a more severe case in the past. She received a flu shot on April 23, 2024.  Her recent mammogram was normal, and her bone density test showed osteopenia. She takes vitamin D  5000 IU daily, which has improved her vitamin D  levels, and engages in water aerobics and weight training twice a week at the [facility] with her husband. She does not take calcium  supplements due to adequate dietary intake and past dental advice.  She takes aspirin  81 mg daily due to a family history of heart issues, including her father's heart murmur and her mother's strokes and heart problems. She has a history of hypothyroidism, managed with daily thyroid   medication taken on an empty stomach. She also takes losartan  and hydrochlorothiazide for blood pressure, noting higher readings in the morning, which she attributes to possible dehydration. She has started drinking water first thing in the morning.  She reports occasional snoring, particularly when tired, but generally sleeps well without daytime fatigue. She takes a daily multivitamin, fish oil, potassium, and Crestor  10 mg at night. She has a history of constipation, now managed with increased exercise and water intake, using laxatives only as needed.  She has multiple non-painful lumps on her body, including her back and legs, which have been present for years without causing issues. She has a bruise-like area on her breast, attributed to a new bra, and has dense breast tissue, leading to annual mammograms.  She has a history of pituitary gland imbalance treated before her pregnancy 32 years ago, which resolved post-pregnancy. She was diagnosed with hypothyroidism years ago, possibly related to her past pituitary issues. Her recent blood work showed normal CBC and slightly low vitamin B12, but she maintains a healthy diet.  ROS As per HPI    Objective:     BP 118/60 (BP Location: Right Arm, Patient Position: Sitting, Cuff Size: Normal)   Pulse 88   Temp 98.4 F (36.9 C) (Oral)   Ht 5' 2 (1.575 m)   Wt 192 lb 3.2 oz (87.2 kg)   SpO2 98%   BMI 35.15 kg/m      04/28/2024   10:56 AM 05/05/2023    8:29 AM 05/05/2023    8:19 AM  Depression screen PHQ 2/9  Decreased Interest 0 0 0  Down, Depressed, Hopeless 0 0 0  PHQ - 2 Score 0 0 0  Altered sleeping 0 0 0  Tired, decreased energy 0 0 0  Change in appetite 0 0 0  Feeling bad or failure about yourself  0 0 0  Trouble concentrating 0 0 0  Moving slowly or fidgety/restless 0 0 0  Suicidal thoughts 0 0 0  PHQ-9 Score 0 0 0  Difficult doing work/chores Not difficult at all Not difficult at all Not difficult at all      04/28/2024    10:56 AM 04/01/2023    9:17 AM 10/02/2022    3:22 PM 03/06/2020    9:01 AM  GAD 7 : Generalized Anxiety Score  Nervous, Anxious, on Edge 0 0 0 0  Control/stop worrying 0 0 0 0  Worry too much - different things 0 0 0 0  Trouble relaxing 0 0 0 0  Restless 0 0 0 0  Easily annoyed or irritable 0 0 0 0  Afraid - awful might happen 0 0 0 0  Total GAD 7 Score 0 0 0 0  Anxiety Difficulty Not difficult at all Not difficult at all Not difficult at all Not difficult at all      04/28/2024   10:56 AM 05/05/2023    8:29 AM 05/05/2023    8:19 AM  Depression screen PHQ 2/9  Decreased Interest 0 0 0  Down, Depressed, Hopeless 0 0 0  PHQ - 2 Score 0 0 0  Altered sleeping 0 0 0  Tired, decreased energy 0 0 0  Change in appetite 0 0 0  Feeling bad or failure about yourself  0 0 0  Trouble concentrating 0 0 0  Moving slowly or fidgety/restless 0 0 0  Suicidal thoughts 0 0 0  PHQ-9 Score 0 0 0  Difficult doing work/chores Not difficult at all Not difficult at all Not difficult at all      04/28/2024   10:56 AM 04/01/2023    9:17 AM 10/02/2022    3:22 PM 03/06/2020    9:01 AM  GAD 7 : Generalized Anxiety Score  Nervous, Anxious, on Edge 0 0 0 0  Control/stop worrying 0 0 0 0  Worry too much - different things 0 0 0 0  Trouble relaxing 0 0 0 0  Restless 0 0 0 0  Easily annoyed or irritable 0 0 0 0  Afraid - awful might happen 0 0 0 0  Total GAD 7 Score 0 0 0 0  Anxiety Difficulty Not difficult at all Not difficult at all Not difficult at all Not difficult at all   SDOH Screenings   Food Insecurity: No Food Insecurity (04/26/2024)  Housing: Low Risk  (04/26/2024)  Transportation Needs: No Transportation Needs (04/26/2024)  Utilities: Not At Risk (05/05/2023)  Alcohol Screen: Low Risk  (05/05/2023)  Depression (PHQ2-9): Low Risk  (04/28/2024)  Financial Resource Strain: Low Risk  (04/26/2024)  Physical Activity: Insufficiently Active (04/26/2024)  Social Connections: Socially Integrated (04/26/2024)   Stress: No Stress Concern Present (04/26/2024)  Tobacco Use: Low Risk  (04/28/2024)  Health Literacy: Adequate Health Literacy (05/05/2023)     Physical Exam Constitutional:      General: She is not in acute distress.    Appearance: Normal appearance.  HENT:     Head: Normocephalic and atraumatic.     Right Ear: Tympanic membrane normal.     Left Ear: Tympanic membrane normal.     Mouth/Throat:     Mouth: Mucous membranes are moist.  Neck:     Thyroid : No  thyroid  mass or thyroid  tenderness.  Cardiovascular:     Rate and Rhythm: Normal rate and regular rhythm.  Pulmonary:     Effort: Pulmonary effort is normal.     Breath sounds: Normal breath sounds. No wheezing.  Abdominal:     General: Bowel sounds are normal.     Palpations: Abdomen is soft.     Tenderness: There is no guarding.  Musculoskeletal:     Cervical back: Neck supple. No rigidity.     Right lower leg: No edema.     Left lower leg: No edema.  Skin:    General: Skin is warm.  Neurological:     Mental Status: She is alert and oriented to person, place, and time.  Psychiatric:        Mood and Affect: Mood normal.        Behavior: Behavior normal.        No results found for any visits on 04/28/24.  The 10-year ASCVD risk score (Arnett DK, et al., 2019) is: 7.2%     Assessment & Plan:  Patient is a pleasant 68 year old female presenting for transfer of care from previous PCP. She recently had COVID-19 with home test positive and mild symptoms. Recommend waiting fro 2-3 months and updating booster for COVID-19. Up to date on influenza immunization.   Essential hypertension Assessment & Plan: BP within goal today. No chest pain, palpitations, lower leg edema.  Continue Losartan -Hydrochlorothiazide 50-12.5 mg daily, continue. Continue Potassium 20 meq daily. Repeat CMP    Orders: -     Losartan  Potassium-HCTZ; Take 1 tablet by mouth daily.  Dispense: 90 tablet; Refill: 3 -     Comprehensive metabolic  panel with GFR; Future -     Potassium Chloride  Crys ER; Take 1 tablet (20 mEq total) by mouth daily.  Dispense: 90 tablet; Refill: 3  Acquired hypothyroidism Assessment & Plan: Long-standing hypothyroidism well-managed with levothyroxine  75 mcg daily, continue. Check TSH  Orders: -     Levothyroxine  Sodium; TAKE 1 TABLET BY MOUTH 30 TO 60 MINUTES BEFORE BREAKFAST ON AN EMPTY STOMACH  Dispense: 90 tablet; Refill: 3 -     TSH; Future  Mixed hyperlipidemia Assessment & Plan: Check fasting lipid panel, lab ordered. Continue Rosuvastatin  10 mg daily. Check CMP.  Orders: -     Rosuvastatin  Calcium ; Take 1 tablet (10 mg total) by mouth daily.  Dispense: 90 tablet; Refill: 3 -     Lipid panel; Future  Class 1 obesity Assessment & Plan: Goal weight 140. She stays active and participates in various strength and cardiovascular exercise. Does have prediabetes, hypertension, hyperlipidemia.  Continue regular exercise, diet changes with goal of calorie deficit recommended.    Prediabetes Assessment & Plan: Check A1c.  Patient exercises regularly. Will continue to work on reduced caloric intake daily.   Orders: -     Hemoglobin A1c; Future  Vitamin D  deficiency Assessment & Plan: Previously diagnosed with vitamin D  deficiency improved with daily OTC vitamin D  5,000 units daily.  Has osteopenia of b/l femur neck.  Repeat vitamin D ,, continue current vitamin D  supplement.   Orders: -     VITAMIN D  25 Hydroxy (Vit-D Deficiency, Fractures); Future  Osteopenia of multiple sites Assessment & Plan: On vitamin D  5,000 units daily, consumes calcium  rich meal.  Check vitamin D , CMP.  Will continue to monitor , repeat DEXA in 3 years    Lipomatosis Assessment & Plan: Painless, multiple, diffuse benign subcutaneous disposition on abdomen, b/l  upper arms, continue to monitor.     Return for Fasting labs couple of days before next appointment .   Luke Shade, MD

## 2024-05-09 ENCOUNTER — Other Ambulatory Visit (INDEPENDENT_AMBULATORY_CARE_PROVIDER_SITE_OTHER)

## 2024-05-09 DIAGNOSIS — R7303 Prediabetes: Secondary | ICD-10-CM | POA: Diagnosis not present

## 2024-05-09 DIAGNOSIS — E782 Mixed hyperlipidemia: Secondary | ICD-10-CM

## 2024-05-09 DIAGNOSIS — E559 Vitamin D deficiency, unspecified: Secondary | ICD-10-CM

## 2024-05-09 DIAGNOSIS — E039 Hypothyroidism, unspecified: Secondary | ICD-10-CM | POA: Diagnosis not present

## 2024-05-09 DIAGNOSIS — I10 Essential (primary) hypertension: Secondary | ICD-10-CM | POA: Diagnosis not present

## 2024-05-09 LAB — HEMOGLOBIN A1C: Hgb A1c MFr Bld: 5.8 % (ref 4.6–6.5)

## 2024-05-09 LAB — COMPREHENSIVE METABOLIC PANEL WITH GFR
ALT: 16 U/L (ref 0–35)
AST: 14 U/L (ref 0–37)
Albumin: 4.4 g/dL (ref 3.5–5.2)
Alkaline Phosphatase: 71 U/L (ref 39–117)
BUN: 13 mg/dL (ref 6–23)
CO2: 27 meq/L (ref 19–32)
Calcium: 9.5 mg/dL (ref 8.4–10.5)
Chloride: 101 meq/L (ref 96–112)
Creatinine, Ser: 0.5 mg/dL (ref 0.40–1.20)
GFR: 97.89 mL/min (ref >60.00)
Glucose, Bld: 105 mg/dL — ABNORMAL HIGH (ref 70–99)
Potassium: 4.2 meq/L (ref 3.5–5.1)
Sodium: 138 meq/L (ref 135–145)
Total Bilirubin: 0.7 mg/dL (ref 0.2–1.2)
Total Protein: 6.4 g/dL (ref 6.0–8.3)

## 2024-05-09 LAB — LIPID PANEL
Cholesterol: 146 mg/dL (ref 0–200)
HDL: 46.6 mg/dL (ref >39.00)
LDL Cholesterol: 78 mg/dL (ref 0–99)
NonHDL: 99.27
Total CHOL/HDL Ratio: 3
Triglycerides: 105 mg/dL (ref 0.0–149.0)
VLDL: 21 mg/dL (ref 0.0–40.0)

## 2024-05-09 LAB — TSH: TSH: 1.91 u[IU]/mL (ref 0.35–5.50)

## 2024-05-09 LAB — VITAMIN D 25 HYDROXY (VIT D DEFICIENCY, FRACTURES): VITD: 49.21 ng/mL (ref 30.00–100.00)

## 2024-05-10 ENCOUNTER — Ambulatory Visit: Payer: Self-pay

## 2024-05-10 NOTE — Progress Notes (Signed)
 Hold results to discuss during upcoming appointment on 05/16/24.   Lindsay Shade, MD

## 2024-05-16 ENCOUNTER — Ambulatory Visit (INDEPENDENT_AMBULATORY_CARE_PROVIDER_SITE_OTHER)

## 2024-05-16 VITALS — BP 110/70 | HR 87 | Temp 98.3°F | Ht 62.0 in | Wt 191.2 lb

## 2024-05-16 DIAGNOSIS — Z0000 Encounter for general adult medical examination without abnormal findings: Secondary | ICD-10-CM | POA: Diagnosis not present

## 2024-05-16 DIAGNOSIS — E039 Hypothyroidism, unspecified: Secondary | ICD-10-CM

## 2024-05-16 DIAGNOSIS — R7303 Prediabetes: Secondary | ICD-10-CM | POA: Diagnosis not present

## 2024-05-16 DIAGNOSIS — E782 Mixed hyperlipidemia: Secondary | ICD-10-CM

## 2024-05-16 DIAGNOSIS — I10 Essential (primary) hypertension: Secondary | ICD-10-CM

## 2024-05-16 DIAGNOSIS — M8589 Other specified disorders of bone density and structure, multiple sites: Secondary | ICD-10-CM

## 2024-05-16 NOTE — Assessment & Plan Note (Signed)
 Recommend repeat DEXA in 3 years from 03/21/24, osteopenia stable. Recommend continue vitamin D  5000 units daily. Gets calcium  through calcium  rich food.

## 2024-05-16 NOTE — Assessment & Plan Note (Signed)
 Reviewed lipid panel and CMP from from 05/09/24. Lipid panel within goal, CMP with normal liver function. Continue Rosuvastatin  10 mg daily.

## 2024-05-16 NOTE — Progress Notes (Signed)
 Results discussed during OV on 05/16/24.   Luke Shade, MD

## 2024-05-16 NOTE — Assessment & Plan Note (Signed)
 A1c stable based on lab from 05/09/24. She will continue to work on regular exercise, healthy diet.

## 2024-05-16 NOTE — Assessment & Plan Note (Signed)
 UDT in immunization, since she had COVID 19 in med September I recommend patient update COVID-19 booster 3 months after known infection, around December.  UDT on mammogram, repeat in 03/2025.  UDT on DEXA, has osteopenia, recommend repeat in 03/2027. Continue vitamin D  5,000 units daily, gets calcium  through diet.  Underwent hysterectomy, PAP not indicated.  Continue healthy lifestyle including diet, exercise which patient already participates in.  Waiting for ACP paperwork to be updated in her chart, she dropped completed ACP paperwork in 04/2024.   Health Maintenance  Topic Date Due   COVID-19 Vaccine (8 - 2025-26 season) 04/04/2024   Medicare Annual Wellness Visit  05/04/2024   Breast Cancer Screening  03/21/2025   Colon Cancer Screening  08/02/2031   DTaP/Tdap/Td vaccine (3 - Td or Tdap) 09/14/2031   Pneumococcal Vaccine for age over 62  Completed   Flu Shot  Completed   DEXA scan (bone density measurement)  Completed   Hepatitis C Screening  Completed   Zoster (Shingles) Vaccine  Completed   Meningitis B Vaccine  Aged Out
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
## 2024-05-16 NOTE — Assessment & Plan Note (Signed)
 BP within goal today. No chest pain, palpitations, lower leg edema.  Continue Losartan -Hydrochlorothiazide 50-12.5 mg daily, continue. Continue Potassium 20 meq daily. CMP from 05/09/24 reviewed, normal.

## 2024-05-16 NOTE — Assessment & Plan Note (Signed)
 Long-standing hypothyroidism well-managed with levothyroxine  75 mcg daily, continue. TSH from 05/09/24 normal. F/U in 6 months with repeat TSH recommended.

## 2024-05-16 NOTE — Patient Instructions (Addendum)
 Follow up in 6 months, repeat labs couple days before appointment.

## 2024-05-17 ENCOUNTER — Ambulatory Visit (INDEPENDENT_AMBULATORY_CARE_PROVIDER_SITE_OTHER): Admitting: *Deleted

## 2024-05-17 VITALS — Ht 62.0 in | Wt 191.0 lb

## 2024-05-17 DIAGNOSIS — Z0000 Encounter for general adult medical examination without abnormal findings: Secondary | ICD-10-CM

## 2024-05-17 NOTE — Patient Instructions (Signed)
 Lindsay Carney,  Thank you for taking the time for your Medicare Wellness Visit. I appreciate your continued commitment to your health goals. Please review the care plan we discussed, and feel free to reach out if I can assist you further.  Medicare recommends these wellness visits once per year to help you and your care team stay ahead of potential health issues. These visits are designed to focus on prevention, allowing your provider to concentrate on managing your acute and chronic conditions during your regular appointments.  Please note that Annual Wellness Visits do not include a physical exam. Some assessments may be limited, especially if the visit was conducted virtually. If needed, we may recommend a separate in-person follow-up with your provider.  Ongoing Care Seeing your primary care provider every 3 to 6 months helps us  monitor your health and provide consistent, personalized care.  Remember to get your covid vaccine when you can.  Referrals If a referral was made during today's visit and you haven't received any updates within two weeks, please contact the referred provider directly to check on the status.  Recommended Screenings:  Health Maintenance  Topic Date Due   COVID-19 Vaccine (8 - 2025-26 season) 04/04/2024   Breast Cancer Screening  03/21/2025   Medicare Annual Wellness Visit  05/17/2025   Colon Cancer Screening  08/02/2031   DTaP/Tdap/Td vaccine (3 - Td or Tdap) 09/14/2031   Pneumococcal Vaccine for age over 81  Completed   Flu Shot  Completed   DEXA scan (bone density measurement)  Completed   Hepatitis C Screening  Completed   Zoster (Shingles) Vaccine  Completed   Meningitis B Vaccine  Aged Out       05/17/2024    1:21 PM  Advanced Directives  Does Patient Have a Medical Advance Directive? Yes  Type of Estate agent of [place];Living will  Copy of Healthcare Power of Attorney in Chart? No - copy requested   Advance Care Planning is  important because it: Ensures you receive medical care that aligns with your values, goals, and preferences. Provides guidance to your family and loved ones, reducing the emotional burden of decision-making during critical moments.  Vision: Annual vision screenings are recommended for early detection of glaucoma, cataracts, and diabetic retinopathy. These exams can also reveal signs of chronic conditions such as diabetes and high blood pressure.  Dental: Annual dental screenings help detect early signs of oral cancer, gum disease, and other conditions linked to overall health, including heart disease and diabetes.  Please see the attached documents for additional preventive care recommendations.
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
## 2024-08-08 ENCOUNTER — Other Ambulatory Visit: Payer: Self-pay

## 2024-08-08 DIAGNOSIS — E039 Hypothyroidism, unspecified: Secondary | ICD-10-CM

## 2024-08-08 NOTE — Telephone Encounter (Signed)
 Copied from CRM [id number]. Topic: Clinical - Medication Refill >> Aug 08, 2024  9:58 AM Roselie BROCKS wrote: Medication: levothyroxine  (SYNTHROID ) 75 MCG tablet Cholecalciferol  (VITAMIN D -3) 125 MCG (5000 UT) TABS  Patient says new insurance will cover the Vitamin D  now.  Has the patient contacted their pharmacy? Yes (Agent: If no, request that the patient contact the pharmacy for the refill. If patient does not wish to contact the pharmacy document the reason why and proceed with request.) (Agent: If yes, when and what did the pharmacy advise?)  This is the patient's preferred pharmacy:  [id number] - [place], [place] - [address] [place] [address] Phone: [phone] Fax: [phone]  Is this the correct pharmacy for this prescription? Yes If no, delete pharmacy and type the correct one.   Has the prescription been filled recently? no  Is the patient out of the medication? No  Has the patient been seen for an appointment in the last year OR does the patient have an upcoming appointment? Yes  Can we respond through MyChart? Yes  Agent: Please be advised that Rx refills may take up to 3 business days. We ask that you follow-up with your pharmacy.

## 2024-08-10 DIAGNOSIS — M8589 Other specified disorders of bone density and structure, multiple sites: Secondary | ICD-10-CM

## 2024-08-10 DIAGNOSIS — Z8639 Personal history of other endocrine, nutritional and metabolic disease: Secondary | ICD-10-CM

## 2024-08-12 MED ORDER — LEVOTHYROXINE SODIUM 75 MCG PO TABS: ORAL_TABLET | ORAL | 3 refills | Status: AC

## 2024-09-12 ENCOUNTER — Ambulatory Visit

## 2024-11-14 ENCOUNTER — Other Ambulatory Visit

## 2024-11-16 ENCOUNTER — Ambulatory Visit

## 2025-05-17 ENCOUNTER — Ambulatory Visit
